# Patient Record
Sex: Male | Born: 1960 | Race: White | Hispanic: No | Marital: Single | Smoking: Current every day smoker
Health system: Southern US, Community
[De-identification: ages and names within clinical notes are randomized; demographics above are authoritative.]

## PROBLEM LIST (undated history)

## (undated) DIAGNOSIS — I771 Stricture of artery: Secondary | ICD-10-CM

## (undated) DIAGNOSIS — Z720 Tobacco use: Secondary | ICD-10-CM

## (undated) DIAGNOSIS — F1010 Alcohol abuse, uncomplicated: Secondary | ICD-10-CM

## (undated) DIAGNOSIS — E785 Hyperlipidemia, unspecified: Secondary | ICD-10-CM

## (undated) DIAGNOSIS — E119 Type 2 diabetes mellitus without complications: Secondary | ICD-10-CM

## (undated) DIAGNOSIS — I10 Essential (primary) hypertension: Secondary | ICD-10-CM

## (undated) DIAGNOSIS — I739 Peripheral vascular disease, unspecified: Secondary | ICD-10-CM

## (undated) DIAGNOSIS — I252 Old myocardial infarction: Secondary | ICD-10-CM

## (undated) DIAGNOSIS — I2510 Atherosclerotic heart disease of native coronary artery without angina pectoris: Secondary | ICD-10-CM

## (undated) HISTORY — PX: TONSILECTOMY, ADENOIDECTOMY, BILATERAL MYRINGOTOMY AND TUBES: SHX2538

## (undated) HISTORY — DX: Essential (primary) hypertension: I10

## (undated) HISTORY — DX: Type 2 diabetes mellitus without complications: E11.9

## (undated) HISTORY — DX: Hyperlipidemia, unspecified: E78.5

## (undated) HISTORY — DX: Old myocardial infarction: I25.2

## (undated) HISTORY — DX: Peripheral vascular disease, unspecified: I73.9

## (undated) HISTORY — DX: Atherosclerotic heart disease of native coronary artery without angina pectoris: I25.10

## (undated) HISTORY — DX: Stricture of artery: I77.1

---

## 2004-01-20 ENCOUNTER — Emergency Department (HOSPITAL_COMMUNITY): Admission: EM | Admit: 2004-01-20 | Discharge: 2004-01-20 | Payer: Self-pay | Admitting: *Deleted

## 2013-04-25 ENCOUNTER — Emergency Department (HOSPITAL_COMMUNITY)
Admission: EM | Admit: 2013-04-25 | Discharge: 2013-04-26 | Disposition: A | Discharge status: Home / Self Care | Payer: Self-pay | Attending: Emergency Medicine | Admitting: Emergency Medicine

## 2013-04-25 DIAGNOSIS — F10929 Alcohol use, unspecified with intoxication, unspecified: Secondary | ICD-10-CM

## 2013-04-25 DIAGNOSIS — S0003XA Contusion of scalp, initial encounter: Secondary | ICD-10-CM | POA: Insufficient documentation

## 2013-04-25 DIAGNOSIS — F1010 Alcohol abuse, uncomplicated: Secondary | ICD-10-CM | POA: Insufficient documentation

## 2013-04-25 DIAGNOSIS — Y9389 Activity, other specified: Secondary | ICD-10-CM | POA: Insufficient documentation

## 2013-04-25 DIAGNOSIS — IMO0002 Reserved for concepts with insufficient information to code with codable children: Secondary | ICD-10-CM | POA: Insufficient documentation

## 2013-04-25 DIAGNOSIS — Y92410 Unspecified street and highway as the place of occurrence of the external cause: Secondary | ICD-10-CM | POA: Insufficient documentation

## 2013-04-25 MED ORDER — SODIUM CHLORIDE 0.9 % IV BOLUS (SEPSIS)
1000.0000 mL | Freq: Once | INTRAVENOUS | Status: AC
  Administered 2013-04-26: 1000 mL via INTRAVENOUS
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
## 2013-04-26 ENCOUNTER — Emergency Department (HOSPITAL_COMMUNITY): Payer: Self-pay

## 2013-04-26 LAB — URINALYSIS, ROUTINE W REFLEX MICROSCOPIC
Bilirubin Urine: NEGATIVE
Glucose, UA: NEGATIVE mg/dL
Ketones, ur: NEGATIVE mg/dL
Protein, ur: NEGATIVE mg/dL
Urobilinogen, UA: 0.2 mg/dL (ref 0.0–1.0)
pH: 6 (ref 5.0–8.0)

## 2013-04-26 LAB — CBC WITH DIFFERENTIAL/PLATELET
Basophils Absolute: 0.1 10*3/uL (ref 0.0–0.1)
Basophils Relative: 1 % (ref 0–1)
Eosinophils Absolute: 0.1 10*3/uL (ref 0.0–0.7)
Hemoglobin: 14.1 g/dL (ref 13.0–17.0)
MCH: 34.5 pg — ABNORMAL HIGH (ref 26.0–34.0)
MCV: 96.6 fL (ref 78.0–100.0)
Neutrophils Relative %: 55 % (ref 43–77)
Platelets: 151 10*3/uL (ref 150–400)
RBC: 4.09 MIL/uL — ABNORMAL LOW (ref 4.22–5.81)
RDW: 13.3 % (ref 11.5–15.5)

## 2013-04-26 LAB — COMPREHENSIVE METABOLIC PANEL
ALT: 21 U/L (ref 0–53)
Albumin: 3.7 g/dL (ref 3.5–5.2)
Alkaline Phosphatase: 67 U/L (ref 39–117)
CO2: 23 mEq/L (ref 19–32)
Calcium: 8.5 mg/dL (ref 8.4–10.5)
GFR calc non Af Amer: >90 mL/min (ref >90)
Glucose, Bld: 104 mg/dL — ABNORMAL HIGH (ref 70–99)
Sodium: 137 mEq/L (ref 135–145)
Total Bilirubin: 0.3 mg/dL (ref 0.3–1.2)

## 2013-04-26 LAB — LIPASE, BLOOD: Lipase: 30 U/L (ref 11–59)

## 2013-04-26 LAB — ETHANOL: Alcohol, Ethyl (B): 359 mg/dL — ABNORMAL HIGH (ref 0–11)

## 2013-04-26 MED ORDER — ZIPRASIDONE MESYLATE 20 MG IM SOLR
10.0000 mg | Freq: Once | INTRAMUSCULAR | Status: DC
  Filled 2013-04-26: qty 20

## 2013-04-26 MED ORDER — LORAZEPAM 2 MG/ML IJ SOLN
1.0000 mg | Freq: Once | INTRAMUSCULAR | Status: AC
  Administered 2013-04-26: 1 mg via INTRAVENOUS
  Filled 2013-04-26: qty 1

## 2013-04-26 MED ORDER — IOHEXOL 300 MG/ML  SOLN
100.0000 mL | Freq: Once | INTRAMUSCULAR | Status: AC | PRN
  Administered 2013-04-26: 100 mL via INTRAVENOUS
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
## 2013-04-26 NOTE — ED Notes (Signed)
Pt standing up at foot of bed, naked, urinating on the floor. Pt instructed back to bed. Now resting comfortably.

## 2013-04-26 NOTE — ED Notes (Signed)
Patient resting on stretcher states he is feeling ok at present. Alert speech is still a little slurred.
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
## 2013-04-26 NOTE — ED Notes (Signed)
Md was completing discharge papers however patient walked out of room and wouldn't wait for papers, states he is going to work, offered breakfast.

## 2013-09-23 ENCOUNTER — Emergency Department (HOSPITAL_COMMUNITY): Payer: Self-pay

## 2013-09-23 ENCOUNTER — Encounter (HOSPITAL_COMMUNITY): Payer: Self-pay | Admitting: Emergency Medicine

## 2013-09-23 ENCOUNTER — Emergency Department (HOSPITAL_COMMUNITY)
Admission: EM | Admit: 2013-09-23 | Discharge: 2013-09-24 | Disposition: A | Discharge status: Home / Self Care | Payer: Self-pay | Attending: Emergency Medicine | Admitting: Emergency Medicine

## 2013-09-23 DIAGNOSIS — S0181XA Laceration without foreign body of other part of head, initial encounter: Secondary | ICD-10-CM

## 2013-09-23 DIAGNOSIS — X58XXXA Exposure to other specified factors, initial encounter: Secondary | ICD-10-CM | POA: Insufficient documentation

## 2013-09-23 DIAGNOSIS — Y939 Activity, unspecified: Secondary | ICD-10-CM | POA: Insufficient documentation

## 2013-09-23 DIAGNOSIS — Y929 Unspecified place or not applicable: Secondary | ICD-10-CM | POA: Insufficient documentation

## 2013-09-23 DIAGNOSIS — Z23 Encounter for immunization: Secondary | ICD-10-CM | POA: Insufficient documentation

## 2013-09-23 DIAGNOSIS — F1010 Alcohol abuse, uncomplicated: Secondary | ICD-10-CM | POA: Insufficient documentation

## 2013-09-23 DIAGNOSIS — S022XXB Fracture of nasal bones, initial encounter for open fracture: Secondary | ICD-10-CM | POA: Insufficient documentation

## 2013-09-23 DIAGNOSIS — S0180XA Unspecified open wound of other part of head, initial encounter: Secondary | ICD-10-CM | POA: Insufficient documentation

## 2013-09-23 HISTORY — DX: Alcohol abuse, uncomplicated: F10.10

## 2013-09-23 LAB — COMPREHENSIVE METABOLIC PANEL
ALBUMIN: 4.1 g/dL (ref 3.5–5.2)
ALT: 21 U/L (ref 0–53)
AST: 27 U/L (ref 0–37)
Alkaline Phosphatase: 65 U/L (ref 39–117)
BUN: 14 mg/dL (ref 6–23)
CALCIUM: 9.1 mg/dL (ref 8.4–10.5)
CO2: 22 mEq/L (ref 19–32)
CREATININE: 0.72 mg/dL (ref 0.50–1.35)
Chloride: 102 mEq/L (ref 96–112)
GFR calc Af Amer: >90 mL/min (ref >90)
GFR calc non Af Amer: >90 mL/min (ref >90)
Glucose, Bld: 117 mg/dL — ABNORMAL HIGH (ref 70–99)
Potassium: 4 mEq/L (ref 3.7–5.3)
Sodium: 140 mEq/L (ref 137–147)
TOTAL PROTEIN: 7.2 g/dL (ref 6.0–8.3)
Total Bilirubin: 0.3 mg/dL (ref 0.3–1.2)

## 2013-09-23 LAB — CBC WITH DIFFERENTIAL/PLATELET
BASOS ABS: 0 10*3/uL (ref 0.0–0.1)
BASOS PCT: 1 % (ref 0–1)
EOS ABS: 0.1 10*3/uL (ref 0.0–0.7)
EOS PCT: 1 % (ref 0–5)
HCT: 41.6 % (ref 39.0–52.0)
HEMOGLOBIN: 14.5 g/dL (ref 13.0–17.0)
LYMPHS ABS: 2.7 10*3/uL (ref 0.7–4.0)
Lymphocytes Relative: 30 % (ref 12–46)
MCH: 33.7 pg (ref 26.0–34.0)
MCHC: 34.9 g/dL (ref 30.0–36.0)
MCV: 96.7 fL (ref 78.0–100.0)
MONO ABS: 1.2 10*3/uL — AB (ref 0.1–1.0)
MONOS PCT: 14 % — AB (ref 3–12)
Neutro Abs: 4.8 10*3/uL (ref 1.7–7.7)
Neutrophils Relative %: 54 % (ref 43–77)
Platelets: 156 10*3/uL (ref 150–400)
RBC: 4.3 MIL/uL (ref 4.22–5.81)
RDW: 13 % (ref 11.5–15.5)
WBC: 8.8 10*3/uL (ref 4.0–10.5)

## 2013-09-23 LAB — PROTIME-INR
INR: 0.96 (ref 0.00–1.49)
Prothrombin Time: 12.6 seconds (ref 11.6–15.2)

## 2013-09-23 LAB — RAPID URINE DRUG SCREEN, HOSP PERFORMED
Amphetamines: NOT DETECTED
BARBITURATES: NOT DETECTED
BENZODIAZEPINES: NOT DETECTED
COCAINE: NOT DETECTED
OPIATES: NOT DETECTED
TETRAHYDROCANNABINOL: POSITIVE — AB

## 2013-09-23 MED ORDER — PROPOFOL 10 MG/ML IV BOLUS
INTRAVENOUS | Status: AC
  Filled 2013-09-23: qty 20

## 2013-09-23 MED ORDER — LORAZEPAM 2 MG/ML IJ SOLN
2.0000 mg | Freq: Once | INTRAMUSCULAR | Status: AC
  Administered 2013-09-23: 2 mg via INTRAVENOUS
  Filled 2013-09-23: qty 1

## 2013-09-23 MED ORDER — ETOMIDATE 2 MG/ML IV SOLN
INTRAVENOUS | Status: AC | PRN
  Administered 2013-09-23: 1 mg via INTRAVENOUS
  Administered 2013-09-23: 6 mg via INTRAVENOUS
  Administered 2013-09-23: 1 mg via INTRAVENOUS
  Administered 2013-09-23: 12 mg via INTRAVENOUS

## 2013-09-23 MED ORDER — ETOMIDATE 2 MG/ML IV SOLN
0.1500 mg/kg | Freq: Once | INTRAVENOUS | Status: AC
  Administered 2013-09-23: 12 mg via INTRAVENOUS
  Filled 2013-09-23: qty 10

## 2013-09-23 MED ORDER — PROPOFOL 10 MG/ML IV BOLUS: 1.0000 mg/kg | Freq: Once | INTRAVENOUS | Status: DC
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
## 2013-09-23 NOTE — ED Provider Notes (Signed)
CSN: [id number]     Arrival date & time 09/23/13  1813 History   First MD Initiated Contact with Patient 09/23/13 1819     Chief Complaint  Patient presents with  . Head Laceration   (Consider location/radiation/quality/duration/timing/severity/associated sxs/prior Treatment) HPI Comments: Patient is a 53 year old male with a past medical history of ETOH abuse who presents with a facial laceration that occurred prior to arrival. Patient presents via EMS. Patient's girlfriend found him at home and he was "pretty drunk" according to the girlfriend. She is at the bedside. Patient is a poor historian. Patient and girlfriend are unsure of what happened that caused the laceration. No other associated symptoms.    Past Medical History  Diagnosis Date  . ETOH abuse    No past surgical history on file. No family history on file. History  Substance Use Topics  . Smoking status: Not on file  . Smokeless tobacco: Not on file  . Alcohol Use: Not on file    Review of Systems  Constitutional: Negative for fever, chills and fatigue.  HENT: Negative for trouble swallowing.   Eyes: Negative for visual disturbance.  Respiratory: Negative for shortness of breath.   Cardiovascular: Negative for chest pain and palpitations.  Gastrointestinal: Negative for nausea, vomiting, abdominal pain and diarrhea.  Genitourinary: Negative for dysuria and difficulty urinating.  Musculoskeletal: Negative for arthralgias and neck pain.  Skin: Positive for wound. Negative for color change.  Neurological: Negative for dizziness and weakness.  Psychiatric/Behavioral: Negative for dysphoric mood.    Allergies  Review of patient's allergies indicates not on file.  Home Medications  No current outpatient prescriptions on file. There were no vitals taken for this visit. Physical Exam  Nursing note and vitals reviewed. Constitutional: He is oriented to person, place, and time. He appears well-developed and  well-nourished. No distress.  HENT:  Head: Normocephalic and atraumatic.  Eyes: Conjunctivae and EOM are normal.  Neck: Normal range of motion. Neck supple.  Cardiovascular: Normal rate and regular rhythm.  Exam reveals no gallop and no friction rub.   No murmur heard. Pulmonary/Chest: Effort normal and breath sounds normal. He has no wheezes. He has no rales. He exhibits no tenderness.  Abdominal: Soft. He exhibits no distension. There is no tenderness. There is no rebound and no guarding.  Musculoskeletal: Normal range of motion.  Neurological: He is alert and oriented to person, place, and time.  Speech is goal-oriented. Moves limbs without ataxia.   Skin: Skin is warm and dry.  4cm x3cm "T" shaped deep laceration of central forehead that extends to the nasal bridge and is actively bleeding.   Psychiatric: He has a normal mood and affect. His behavior is normal.    ED Course  Procedures (including critical care time)  Procedural sedation Performed by: Emilia Beck Consent: Verbal consent obtained. Risks and benefits: risks, benefits and alternatives were discussed Required items: required blood products, implants, devices, and special equipment available Patient identity confirmed: arm band and provided demographic data Time out: Immediately prior to procedure a "time out" was called to verify the correct patient, procedure, equipment, support staff and site/side marked as required.  Sedation type: moderate (conscious) sedation NPO time confirmed and considedered  Sedatives: ETOMIDATE  Physician Time at Bedside: 30 minutes  Vitals: Vital signs were monitored during sedation. Cardiac Monitor, pulse oximeter Patient tolerance: Patient tolerated the procedure well with no immediate complications. Comments: Pt with uneventful recovered. Returned to pre-procedural sedation baseline  LACERATION REPAIR Performed by:  Emilia Beck Authorized by: Emilia Beck Consent: Verbal consent obtained. Risks and benefits: risks, benefits and alternatives were discussed Consent given by: patient Patient identity confirmed: provided demographic data Prepped and Draped in normal sterile fashion Wound explored  Laceration Location: central forehead  Laceration Length: 4cmx3cm T-shaped laceration  No Foreign Bodies seen or palpated  Anesthesia: local infiltration  Local anesthetic: lidocaine 2% with epinephrine  Anesthetic total: 8 ml  Irrigation method: syringe Amount of cleaning: standard  Skin closure: 5-0 prolene  Number of sutures: 10  Technique: simple  Patient tolerance: Patient tolerated the procedure well with no immediate complications.    Labs Review Labs Reviewed  CBC WITH DIFFERENTIAL - Abnormal; Notable for the following:    Monocytes Relative 14 (*)    Monocytes Absolute 1.2 (*)    All other components within normal limits  COMPREHENSIVE METABOLIC PANEL - Abnormal; Notable for the following:    Glucose, Bld 117 (*)    All other components within normal limits  URINE RAPID DRUG SCREEN (HOSP PERFORMED) - Abnormal; Notable for the following:    Tetrahydrocannabinol POSITIVE (*)    All other components within normal limits  PROTIME-INR   Imaging Review Ct Head Wo Contrast  09/23/2013   CLINICAL DATA:  ETOH abuse, head laceration  EXAM: CT HEAD WITHOUT CONTRAST  CT MAXILLOFACIAL WITHOUT CONTRAST  CT CERVICAL SPINE WITHOUT CONTRAST  TECHNIQUE: Multidetector CT imaging of the head, cervical spine, and maxillofacial structures were performed using the standard protocol without intravenous contrast. Multiplanar CT image reconstructions of the cervical spine and maxillofacial structures were also generated.  COMPARISON:  CT head dated 09/23/2013 at 1920 hr. CT head cervical spine dated 04/26/2013.  FINDINGS: CT HEAD FINDINGS  Mildly motion degraded images.  No evidence of parenchymal hemorrhage or extra-axial fluid  collection. No mass lesion, mass effect, or midline shift.  No CT evidence of acute infarction.  Cerebral volume is within normal limits. No ventriculomegaly.  Bilateral nasal bone fractures. Soft tissue swelling/laceration overlying the left frontal bone and nasal bridge.  Mucosal thickening involving the bilateral ethmoid and maxillary sinuses. Mastoid air cells are clear.  No evidence of calvarial fracture.  CT MAXILLOFACIAL FINDINGS  Moderate to severe motion degraded images.  Bilateral nasal bone fractures. Soft tissue swelling/laceration overlying the left frontal bone and nasal bridge.  Otherwise, no evidence of maxillofacial fracture.  Mild mucosal thickening involving the bilateral ethmoid and maxillary sinuses. Mastoid air cells are clear.  Right ovary subluxation of C1 on C2.  The dens appears intact.  CT CERVICAL SPINE FINDINGS  Severely motion degraded images.  No gross cervical spine fracture is visualized, although the study is essentially nondiagnostic. Rotary subluxation of C1 on C2.  Visualized lung apices are notable for paraseptal emphysematous changes.  IMPRESSION: Moderate to severe motion degraded images, particularly involving the maxillofacial and cervical spine CTs.  No evidence of acute intracranial abnormality.  Bilateral nasal bone fractures. Soft tissue swelling/laceration overlying the left frontal bone and nasal bridge.  Rotary subluxation of C1 on C2. Dens appears intact on maxillofacial CT.  No gross cervical spine fracture is visualized, although the cervical spine study is essentially nondiagnostic.   Electronically Signed   By: Charline Bills M.D.   On: 09/23/2013 23:04   Ct Head Wo Contrast  09/23/2013   CLINICAL DATA:  Altered mental status, alcohol intoxication, and forehead laceration.  EXAM: CT HEAD WITHOUT CONTRAST  TECHNIQUE: Contiguous axial images were obtained from the base of the skull through  the vertex without intravenous contrast.  COMPARISON:  Noncontrast CT  scan of the brain dated April 26, 2013  FINDINGS: Motion artifact degrades the examination. The ventricles are normal in size and position. There is no intracranial hemorrhage nor intracranial mass effect. There are no findings to suggest an evolving ischemic infarction. The cerebellum and brainstem are grossly normal. At bone window settings the observed portions of the paranasal sinuses and mastoid air cells are clear. No acute skull fracture is demonstrated.  IMPRESSION: 1. The study is quite limited due to motion artifact. There is no definite evidence of an acute intracranial abnormality nor of an acute skull fracture. 2. No intracranial mass effect is demonstrated. 3. Repeating the scan when the patient can tolerate the procedure and remain motionless would be useful.   Electronically Signed   By: David  [place]   On: 09/23/2013 19:48   Ct Cervical Spine Wo Contrast  09/23/2013   CLINICAL DATA:  ETOH abuse, head laceration  EXAM: CT HEAD WITHOUT CONTRAST  CT MAXILLOFACIAL WITHOUT CONTRAST  CT CERVICAL SPINE WITHOUT CONTRAST  TECHNIQUE: Multidetector CT imaging of the head, cervical spine, and maxillofacial structures were performed using the standard protocol without intravenous contrast. Multiplanar CT image reconstructions of the cervical spine and maxillofacial structures were also generated.  COMPARISON:  CT head dated 09/23/2013 at 1920 hr. CT head cervical spine dated 04/26/2013.  FINDINGS: CT HEAD FINDINGS  Mildly motion degraded images.  No evidence of parenchymal hemorrhage or extra-axial fluid collection. No mass lesion, mass effect, or midline shift.  No CT evidence of acute infarction.  Cerebral volume is within normal limits. No ventriculomegaly.  Bilateral nasal bone fractures. Soft tissue swelling/laceration overlying the left frontal bone and nasal bridge.  Mucosal thickening involving the bilateral ethmoid and maxillary sinuses. Mastoid air cells are clear.  No evidence of calvarial  fracture.  CT MAXILLOFACIAL FINDINGS  Moderate to severe motion degraded images.  Bilateral nasal bone fractures. Soft tissue swelling/laceration overlying the left frontal bone and nasal bridge.  Otherwise, no evidence of maxillofacial fracture.  Mild mucosal thickening involving the bilateral ethmoid and maxillary sinuses. Mastoid air cells are clear.  Right ovary subluxation of C1 on C2.  The dens appears intact.  CT CERVICAL SPINE FINDINGS  Severely motion degraded images.  No gross cervical spine fracture is visualized, although the study is essentially nondiagnostic. Rotary subluxation of C1 on C2.  Visualized lung apices are notable for paraseptal emphysematous changes.  IMPRESSION: Moderate to severe motion degraded images, particularly involving the maxillofacial and cervical spine CTs.  No evidence of acute intracranial abnormality.  Bilateral nasal bone fractures. Soft tissue swelling/laceration overlying the left frontal bone and nasal bridge.  Rotary subluxation of C1 on C2. Dens appears intact on maxillofacial CT.  No gross cervical spine fracture is visualized, although the cervical spine study is essentially nondiagnostic.   Electronically Signed   By: Charline BillsSriyesh  Krishnan M.D.   On: 09/23/2013 23:04   Ct Maxillofacial Wo Cm  09/23/2013   CLINICAL DATA:  ETOH abuse, head laceration  EXAM: CT HEAD WITHOUT CONTRAST  CT MAXILLOFACIAL WITHOUT CONTRAST  CT CERVICAL SPINE WITHOUT CONTRAST  TECHNIQUE: Multidetector CT imaging of the head, cervical spine, and maxillofacial structures were performed using the standard protocol without intravenous contrast. Multiplanar CT image reconstructions of the cervical spine and maxillofacial structures were also generated.  COMPARISON:  CT head dated 09/23/2013 at 1920 hr. CT head cervical spine dated 04/26/2013.  FINDINGS: CT HEAD FINDINGS  Mildly motion  degraded images.  No evidence of parenchymal hemorrhage or extra-axial fluid collection. No mass lesion, mass  effect, or midline shift.  No CT evidence of acute infarction.  Cerebral volume is within normal limits. No ventriculomegaly.  Bilateral nasal bone fractures. Soft tissue swelling/laceration overlying the left frontal bone and nasal bridge.  Mucosal thickening involving the bilateral ethmoid and maxillary sinuses. Mastoid air cells are clear.  No evidence of calvarial fracture.  CT MAXILLOFACIAL FINDINGS  Moderate to severe motion degraded images.  Bilateral nasal bone fractures. Soft tissue swelling/laceration overlying the left frontal bone and nasal bridge.  Otherwise, no evidence of maxillofacial fracture.  Mild mucosal thickening involving the bilateral ethmoid and maxillary sinuses. Mastoid air cells are clear.  Right ovary subluxation of C1 on C2.  The dens appears intact.  CT CERVICAL SPINE FINDINGS  Severely motion degraded images.  No gross cervical spine fracture is visualized, although the study is essentially nondiagnostic. Rotary subluxation of C1 on C2.  Visualized lung apices are notable for paraseptal emphysematous changes.  IMPRESSION: Moderate to severe motion degraded images, particularly involving the maxillofacial and cervical spine CTs.  No evidence of acute intracranial abnormality.  Bilateral nasal bone fractures. Soft tissue swelling/laceration overlying the left frontal bone and nasal bridge.  Rotary subluxation of C1 on C2. Dens appears intact on maxillofacial CT.  No gross cervical spine fracture is visualized, although the cervical spine study is essentially nondiagnostic.   Electronically Signed   By: Charline Bills M.D.   On: 09/23/2013 23:04    EKG Interpretation   None       MDM   1. Laceration of face   2. Fracture, nasal bone, open     6:32 PM CT head, cervical spine and face pending. Patient has a large laceration on central forehead. Vitals stable and patient afebrile.   12:35 AM Patient's imaging interpretation limited by motion artifact due to patient  being uncooperative despite IV ativan. Patient has bilateral nasal bone fractures without other acute abnormality. Patient will have follow up with ENT. Patient will go home on antibiotics and have tdap here. No further evaluation needed. Patient laceration repaired.    Emilia Beck, PA-C 09/24/13 [phone]

## 2013-09-23 NOTE — ED Notes (Signed)
Pt brought from home via [facility] with c/o head laceration. Pt was found by his girlfriend at home to have a large laceration to forehead just between eyes. Pt has ETOH on board and is combative. Security at bedside.

## 2013-09-23 NOTE — ED Notes (Signed)
Dr. Rosalia Hammersay at bedside for assessment

## 2013-09-24 MED ORDER — CEPHALEXIN 500 MG PO CAPS: 500.0000 mg | ORAL_CAPSULE | Freq: Four times a day (QID) | ORAL | Status: DC

## 2013-09-24 MED ORDER — TETANUS-DIPHTH-ACELL PERTUSSIS 5-2.5-18.5 LF-MCG/0.5 IM SUSP
0.5000 mL | Freq: Once | INTRAMUSCULAR | Status: AC
  Administered 2013-09-24: 0.5 mL via INTRAMUSCULAR
  Filled 2013-09-24: qty 0.5

## 2013-09-24 NOTE — ED Provider Notes (Signed)
53 year old male who presents today after a head injury. He is extremely intoxicated and unable to give any history. He has a laceration to the forehead extending down to his nose continues to profusely bleed. I was present for conscious sedation and suture repair done by Emilia BeckKaitlyn Szekalski PAC.   I performed a history and physical examination of Karl Garcia and discussed his management with Ms. Murray HodgkinsSzekalski.  I agree with the history, physical, assessment, and plan of care, with the following exceptions: None  I was present for the following procedures: None Time Spent in Critical Care of the patient: None Time spent in discussions with the patient and family: [id number]  Sherrita Riederer S    Karl Quarryanielle S Kristopher Attwood, MD 09/24/13 1600

## 2013-09-24 NOTE — Discharge Instructions (Signed)
Take Keflex as directed until gone. Follow up with Dr. Jearld FentonByers for further evaluation and management of your nasal fracture. Your sutures will need to be removed in 1 week. Refer to attached documents for more information. Take tylenol as needed for pain.

## 2020-02-16 ENCOUNTER — Emergency Department (HOSPITAL_COMMUNITY): Payer: Self-pay

## 2020-02-16 ENCOUNTER — Encounter (HOSPITAL_COMMUNITY)
Admission: RE | Disposition: A | Discharge status: Home / Self Care | Payer: Self-pay | Source: Home / Self Care | Attending: Cardiothoracic Surgery

## 2020-02-16 ENCOUNTER — Encounter (HOSPITAL_COMMUNITY): Payer: Self-pay | Admitting: Cardiology

## 2020-02-16 ENCOUNTER — Other Ambulatory Visit: Payer: Self-pay | Admitting: Cardiothoracic Surgery

## 2020-02-16 ENCOUNTER — Inpatient Hospital Stay (HOSPITAL_COMMUNITY): Payer: Self-pay

## 2020-02-16 ENCOUNTER — Other Ambulatory Visit: Payer: Self-pay

## 2020-02-16 ENCOUNTER — Inpatient Hospital Stay (HOSPITAL_COMMUNITY)
Admission: RE | Admit: 2020-02-16 | Discharge: 2020-02-21 | DRG: 234 | Disposition: A | Discharge status: Home / Self Care | Payer: Self-pay | Attending: Cardiothoracic Surgery | Admitting: Cardiothoracic Surgery

## 2020-02-16 DIAGNOSIS — R079 Chest pain, unspecified: Secondary | ICD-10-CM | POA: Diagnosis present

## 2020-02-16 DIAGNOSIS — E1151 Type 2 diabetes mellitus with diabetic peripheral angiopathy without gangrene: Secondary | ICD-10-CM | POA: Diagnosis present

## 2020-02-16 DIAGNOSIS — D62 Acute posthemorrhagic anemia: Secondary | ICD-10-CM | POA: Diagnosis not present

## 2020-02-16 DIAGNOSIS — I708 Atherosclerosis of other arteries: Secondary | ICD-10-CM | POA: Diagnosis present

## 2020-02-16 DIAGNOSIS — J9811 Atelectasis: Secondary | ICD-10-CM

## 2020-02-16 DIAGNOSIS — I2510 Atherosclerotic heart disease of native coronary artery without angina pectoris: Secondary | ICD-10-CM

## 2020-02-16 DIAGNOSIS — I10 Essential (primary) hypertension: Secondary | ICD-10-CM | POA: Diagnosis present

## 2020-02-16 DIAGNOSIS — Z951 Presence of aortocoronary bypass graft: Secondary | ICD-10-CM

## 2020-02-16 DIAGNOSIS — F1020 Alcohol dependence, uncomplicated: Secondary | ICD-10-CM | POA: Diagnosis present

## 2020-02-16 DIAGNOSIS — E8770 Fluid overload, unspecified: Secondary | ICD-10-CM | POA: Diagnosis not present

## 2020-02-16 DIAGNOSIS — D7589 Other specified diseases of blood and blood-forming organs: Secondary | ICD-10-CM | POA: Diagnosis not present

## 2020-02-16 DIAGNOSIS — Z09 Encounter for follow-up examination after completed treatment for conditions other than malignant neoplasm: Secondary | ICD-10-CM

## 2020-02-16 DIAGNOSIS — I214 Non-ST elevation (NSTEMI) myocardial infarction: Secondary | ICD-10-CM

## 2020-02-16 DIAGNOSIS — Z794 Long term (current) use of insulin: Secondary | ICD-10-CM

## 2020-02-16 DIAGNOSIS — D696 Thrombocytopenia, unspecified: Secondary | ICD-10-CM | POA: Diagnosis not present

## 2020-02-16 DIAGNOSIS — F17210 Nicotine dependence, cigarettes, uncomplicated: Secondary | ICD-10-CM | POA: Diagnosis present

## 2020-02-16 DIAGNOSIS — Z01810 Encounter for preprocedural cardiovascular examination: Secondary | ICD-10-CM

## 2020-02-16 DIAGNOSIS — Z20822 Contact with and (suspected) exposure to covid-19: Secondary | ICD-10-CM | POA: Diagnosis present

## 2020-02-16 DIAGNOSIS — J449 Chronic obstructive pulmonary disease, unspecified: Secondary | ICD-10-CM | POA: Diagnosis present

## 2020-02-16 DIAGNOSIS — Z79899 Other long term (current) drug therapy: Secondary | ICD-10-CM

## 2020-02-16 DIAGNOSIS — I25110 Atherosclerotic heart disease of native coronary artery with unstable angina pectoris: Secondary | ICD-10-CM

## 2020-02-16 DIAGNOSIS — I200 Unstable angina: Secondary | ICD-10-CM

## 2020-02-16 HISTORY — DX: Tobacco use: Z72.0

## 2020-02-16 HISTORY — DX: Non-ST elevation (NSTEMI) myocardial infarction: I21.4

## 2020-02-16 HISTORY — PX: LEFT HEART CATH AND CORONARY ANGIOGRAPHY: CATH118249

## 2020-02-16 LAB — MAGNESIUM: Magnesium: 1.9 mg/dL (ref 1.7–2.4)

## 2020-02-16 LAB — BASIC METABOLIC PANEL
Anion gap: 15 (ref 5–15)
BUN: 13 mg/dL (ref 8–23)
CO2: 21 mmol/L — ABNORMAL LOW (ref 22–32)
Calcium: 8.6 mg/dL — ABNORMAL LOW (ref 8.9–10.3)
Chloride: 104 mmol/L (ref 98–111)
Creatinine, Ser: 0.62 mg/dL (ref 0.61–1.24)
GFR calc Af Amer: >60 mL/min (ref >60)
GFR calc non Af Amer: >60 mL/min (ref >60)
Glucose, Bld: 136 mg/dL — ABNORMAL HIGH (ref 70–99)
Potassium: 3.4 mmol/L — ABNORMAL LOW (ref 3.5–5.1)
Sodium: 140 mmol/L (ref 135–145)

## 2020-02-16 LAB — T4, FREE: Free T4: 0.78 ng/dL (ref 0.61–1.12)

## 2020-02-16 LAB — CBC
HCT: 44.9 % (ref 39.0–52.0)
Hemoglobin: 15 g/dL (ref 13.0–17.0)
MCH: 33.6 pg (ref 26.0–34.0)
MCHC: 33.4 g/dL (ref 30.0–36.0)
MCV: 100.7 fL — ABNORMAL HIGH (ref 80.0–100.0)
Platelets: 157 10*3/uL (ref 150–400)
RBC: 4.46 MIL/uL (ref 4.22–5.81)
RDW: 13.1 % (ref 11.5–15.5)
WBC: 5.3 10*3/uL (ref 4.0–10.5)
nRBC: 0 % (ref 0.0–0.2)

## 2020-02-16 LAB — PREPARE RBC (CROSSMATCH)

## 2020-02-16 LAB — ECHOCARDIOGRAM COMPLETE
Height: 71 in
Weight: 2960 oz

## 2020-02-16 LAB — TROPONIN I (HIGH SENSITIVITY)
Troponin I (High Sensitivity): 89 ng/L — ABNORMAL HIGH (ref <18)
Troponin I (High Sensitivity): 123 ng/L (ref <18)

## 2020-02-16 LAB — HEPATIC FUNCTION PANEL
ALT: 29 U/L (ref 0–44)
AST: 33 U/L (ref 15–41)
Albumin: 3.8 g/dL (ref 3.5–5.0)
Alkaline Phosphatase: 54 U/L (ref 38–126)
Bilirubin, Direct: 0.2 mg/dL (ref 0.0–0.2)
Indirect Bilirubin: 0.6 mg/dL (ref 0.3–0.9)
Total Bilirubin: 0.8 mg/dL (ref 0.3–1.2)
Total Protein: 6.8 g/dL (ref 6.5–8.1)

## 2020-02-16 LAB — HEMOGLOBIN A1C
Hgb A1c MFr Bld: 6.8 % — ABNORMAL HIGH (ref 4.8–5.6)
Mean Plasma Glucose: 148.46 mg/dL

## 2020-02-16 LAB — TSH: TSH: 0.656 u[IU]/mL (ref 0.350–4.500)

## 2020-02-16 LAB — ABO/RH: ABO/RH(D): O POS

## 2020-02-16 LAB — PHOSPHORUS: Phosphorus: 3.6 mg/dL (ref 2.5–4.6)

## 2020-02-16 LAB — LIPASE, BLOOD: Lipase: 22 U/L (ref 11–51)

## 2020-02-16 LAB — SARS CORONAVIRUS 2 BY RT PCR (HOSPITAL ORDER, PERFORMED IN ~~LOC~~ HOSPITAL LAB): SARS Coronavirus 2: NEGATIVE

## 2020-02-16 SURGERY — LEFT HEART CATH AND CORONARY ANGIOGRAPHY
Anesthesia: LOCAL

## 2020-02-16 MED ORDER — DIAZEPAM 5 MG PO TABS
5.0000 mg | ORAL_TABLET | Freq: Once | ORAL | Status: AC
  Administered 2020-02-17: 5 mg via ORAL
  Filled 2020-02-16: qty 1

## 2020-02-16 MED ORDER — MILRINONE LACTATE IN DEXTROSE 20-5 MG/100ML-% IV SOLN
0.3000 ug/kg/min | INTRAVENOUS | Status: DC
  Filled 2020-02-16: qty 100

## 2020-02-16 MED ORDER — METOPROLOL TARTRATE 5 MG/5ML IV SOLN
2.5000 mg | Freq: Once | INTRAVENOUS | Status: AC
  Administered 2020-02-16: 2.5 mg via INTRAVENOUS
  Filled 2020-02-16: qty 5

## 2020-02-16 MED ORDER — SODIUM CHLORIDE 0.9 % IV SOLN: INTRAVENOUS | Status: DC

## 2020-02-16 MED ORDER — SODIUM CHLORIDE 0.9 % IV SOLN: 250.0000 mL | INTRAVENOUS | Status: DC | PRN

## 2020-02-16 MED ORDER — LORAZEPAM 1 MG PO TABS
0.0000 mg | ORAL_TABLET | Freq: Four times a day (QID) | ORAL | Status: AC
  Administered 2020-02-18: 1 mg via ORAL
  Filled 2020-02-16: qty 1

## 2020-02-16 MED ORDER — EPINEPHRINE HCL 5 MG/250ML IV SOLN IN NS
0.0000 ug/min | INTRAVENOUS | Status: DC
  Filled 2020-02-16: qty 250

## 2020-02-16 MED ORDER — POTASSIUM CHLORIDE 2 MEQ/ML IV SOLN
80.0000 meq | INTRAVENOUS | Status: DC
  Filled 2020-02-16: qty 40

## 2020-02-16 MED ORDER — TRANEXAMIC ACID (OHS) PUMP PRIME SOLUTION
2.0000 mg/kg | INTRAVENOUS | Status: DC
  Filled 2020-02-16: qty 1.68

## 2020-02-16 MED ORDER — LORAZEPAM 2 MG/ML IJ SOLN
1.0000 mg | Freq: Once | INTRAMUSCULAR | Status: AC
  Administered 2020-02-16: 1 mg via INTRAVENOUS

## 2020-02-16 MED ORDER — ASPIRIN 81 MG PO CHEW
324.0000 mg | CHEWABLE_TABLET | Freq: Once | ORAL | Status: AC
  Administered 2020-02-16: 324 mg via ORAL
  Filled 2020-02-16: qty 4

## 2020-02-16 MED ORDER — LORAZEPAM 1 MG PO TABS
0.0000 mg | ORAL_TABLET | Freq: Two times a day (BID) | ORAL | Status: AC
  Filled 2020-02-16: qty 1

## 2020-02-16 MED ORDER — CHLORHEXIDINE GLUCONATE 4 % EX LIQD
60.0000 mL | Freq: Once | CUTANEOUS | Status: AC
  Administered 2020-02-16: 4 via TOPICAL
  Filled 2020-02-16: qty 60

## 2020-02-16 MED ORDER — IOHEXOL 350 MG/ML SOLN
INTRAVENOUS | Status: DC | PRN
  Administered 2020-02-16: 85 mL via INTRA_ARTERIAL

## 2020-02-16 MED ORDER — MOMETASONE FURO-FORMOTEROL FUM 200-5 MCG/ACT IN AERO
2.0000 | INHALATION_SPRAY | Freq: Two times a day (BID) | RESPIRATORY_TRACT | Status: DC
  Administered 2020-02-17: 2 via RESPIRATORY_TRACT
  Filled 2020-02-16: qty 8.8

## 2020-02-16 MED ORDER — NICOTINE 21 MG/24HR TD PT24
21.0000 mg | MEDICATED_PATCH | Freq: Once | TRANSDERMAL | Status: DC
  Filled 2020-02-16: qty 1

## 2020-02-16 MED ORDER — SODIUM CHLORIDE 0.9 % IV SOLN: INTRAVENOUS | Status: AC

## 2020-02-16 MED ORDER — NITROGLYCERIN 0.4 MG SL SUBL
0.4000 mg | SUBLINGUAL_TABLET | SUBLINGUAL | Status: DC | PRN
  Administered 2020-02-16 (×3): 0.4 mg via SUBLINGUAL
  Filled 2020-02-16: qty 1

## 2020-02-16 MED ORDER — FENTANYL CITRATE (PF) 100 MCG/2ML IJ SOLN
INTRAMUSCULAR | Status: AC
  Filled 2020-02-16: qty 2

## 2020-02-16 MED ORDER — ADULT MULTIVITAMIN W/MINERALS CH
1.0000 | ORAL_TABLET | Freq: Every day | ORAL | Status: DC
  Administered 2020-02-16: 1 via ORAL
  Filled 2020-02-16: qty 1

## 2020-02-16 MED ORDER — TRANEXAMIC ACID (OHS) BOLUS VIA INFUSION
15.0000 mg/kg | INTRAVENOUS | Status: AC
  Administered 2020-02-17: 1258.5 mg via INTRAVENOUS
  Filled 2020-02-16: qty 1259

## 2020-02-16 MED ORDER — ASPIRIN 81 MG PO CHEW: 81.0000 mg | CHEWABLE_TABLET | Freq: Every day | ORAL | Status: DC

## 2020-02-16 MED ORDER — LIDOCAINE HCL (PF) 1 % IJ SOLN
INTRAMUSCULAR | Status: AC
  Filled 2020-02-16: qty 30

## 2020-02-16 MED ORDER — CHLORHEXIDINE GLUCONATE 0.12 % MT SOLN
15.0000 mL | Freq: Once | OROMUCOSAL | Status: AC
  Administered 2020-02-17: 15 mL via OROMUCOSAL
  Filled 2020-02-16: qty 15

## 2020-02-16 MED ORDER — SODIUM CHLORIDE 0.9 % IV SOLN
750.0000 mg | INTRAVENOUS | Status: AC
  Administered 2020-02-17: 750 mg via INTRAVENOUS
  Filled 2020-02-16: qty 750

## 2020-02-16 MED ORDER — TRANEXAMIC ACID 1000 MG/10ML IV SOLN
1.5000 mg/kg/h | INTRAVENOUS | Status: AC
  Administered 2020-02-17: 1.5 mg/kg/h via INTRAVENOUS
  Filled 2020-02-16: qty 25

## 2020-02-16 MED ORDER — HEPARIN SODIUM (PORCINE) 1000 UNIT/ML IJ SOLN
INTRAMUSCULAR | Status: AC
  Filled 2020-02-16: qty 1

## 2020-02-16 MED ORDER — NITROGLYCERIN 1 MG/10 ML FOR IR/CATH LAB
INTRA_ARTERIAL | Status: AC
  Filled 2020-02-16: qty 10

## 2020-02-16 MED ORDER — SODIUM CHLORIDE 0.9% FLUSH: 3.0000 mL | Freq: Once | INTRAVENOUS | Status: DC

## 2020-02-16 MED ORDER — ACETAMINOPHEN 325 MG PO TABS
650.0000 mg | ORAL_TABLET | ORAL | Status: DC | PRN
  Administered 2020-02-17: 650 mg via ORAL
  Filled 2020-02-16: qty 2

## 2020-02-16 MED ORDER — SODIUM CHLORIDE 0.9% FLUSH: 3.0000 mL | INTRAVENOUS | Status: DC | PRN

## 2020-02-16 MED ORDER — ONDANSETRON HCL 4 MG/2ML IJ SOLN: 4.0000 mg | Freq: Four times a day (QID) | INTRAMUSCULAR | Status: DC | PRN

## 2020-02-16 MED ORDER — PLASMA-LYTE 148 IV SOLN
INTRAVENOUS | Status: DC
  Filled 2020-02-16: qty 2.5

## 2020-02-16 MED ORDER — HEPARIN (PORCINE) IN NACL 1000-0.9 UT/500ML-% IV SOLN
INTRAVENOUS | Status: AC
  Filled 2020-02-16: qty 500

## 2020-02-16 MED ORDER — TEMAZEPAM 15 MG PO CAPS
15.0000 mg | ORAL_CAPSULE | Freq: Once | ORAL | Status: AC | PRN
  Administered 2020-02-16: 15 mg via ORAL
  Filled 2020-02-16: qty 1

## 2020-02-16 MED ORDER — NITROGLYCERIN IN D5W 200-5 MCG/ML-% IV SOLN
0.0000 ug/min | INTRAVENOUS | Status: DC
  Administered 2020-02-16: 10 ug/min via INTRAVENOUS
  Administered 2020-02-16: 5 ug/min via INTRAVENOUS
  Administered 2020-02-17: 60 ug/min via INTRAVENOUS
  Filled 2020-02-16 (×3): qty 250

## 2020-02-16 MED ORDER — ONDANSETRON HCL 4 MG/2ML IJ SOLN
4.0000 mg | Freq: Four times a day (QID) | INTRAMUSCULAR | Status: DC | PRN
Start: 2020-02-16 — End: 2020-02-16

## 2020-02-16 MED ORDER — HYDRALAZINE HCL 20 MG/ML IJ SOLN: 10.0000 mg | INTRAMUSCULAR | Status: AC | PRN

## 2020-02-16 MED ORDER — ASPIRIN 81 MG PO CHEW: 81.0000 mg | CHEWABLE_TABLET | ORAL | Status: DC

## 2020-02-16 MED ORDER — THIAMINE HCL 100 MG/ML IJ SOLN: 100.0000 mg | Freq: Every day | INTRAMUSCULAR | Status: DC

## 2020-02-16 MED ORDER — THIAMINE HCL 100 MG PO TABS
100.0000 mg | ORAL_TABLET | Freq: Every day | ORAL | Status: DC
  Administered 2020-02-16 – 2020-02-21 (×5): 100 mg via ORAL
  Filled 2020-02-16 (×5): qty 1

## 2020-02-16 MED ORDER — HEPARIN (PORCINE) IN NACL 1000-0.9 UT/500ML-% IV SOLN
INTRAVENOUS | Status: DC | PRN
  Administered 2020-02-16 (×2): 500 mL

## 2020-02-16 MED ORDER — SODIUM CHLORIDE 0.9 % IV SOLN
1.5000 g | INTRAVENOUS | Status: AC
  Administered 2020-02-17: 1.5 g via INTRAVENOUS
  Filled 2020-02-16 (×2): qty 1.5

## 2020-02-16 MED ORDER — FENTANYL CITRATE (PF) 100 MCG/2ML IJ SOLN
INTRAMUSCULAR | Status: DC | PRN
  Administered 2020-02-16: 25 ug via INTRAVENOUS

## 2020-02-16 MED ORDER — MIDAZOLAM HCL 2 MG/2ML IJ SOLN
INTRAMUSCULAR | Status: AC
  Filled 2020-02-16: qty 2

## 2020-02-16 MED ORDER — LORAZEPAM 2 MG/ML IJ SOLN
1.0000 mg | INTRAMUSCULAR | Status: DC | PRN
  Filled 2020-02-16: qty 1

## 2020-02-16 MED ORDER — INSULIN REGULAR(HUMAN) IN NACL 100-0.9 UT/100ML-% IV SOLN
INTRAVENOUS | Status: AC
  Administered 2020-02-17: 1.8 [IU]/h via INTRAVENOUS
  Filled 2020-02-16: qty 100

## 2020-02-16 MED ORDER — PERFLUTREN LIPID MICROSPHERE
1.0000 mL | INTRAVENOUS | Status: AC | PRN
  Administered 2020-02-16: 3 mL via INTRAVENOUS
  Filled 2020-02-16: qty 10

## 2020-02-16 MED ORDER — LIDOCAINE HCL (PF) 1 % IJ SOLN
INTRAMUSCULAR | Status: DC | PRN
  Administered 2020-02-16: 5 mL via INTRADERMAL

## 2020-02-16 MED ORDER — HEPARIN (PORCINE) 25000 UT/250ML-% IV SOLN
1300.0000 [IU]/h | INTRAVENOUS | Status: DC
  Administered 2020-02-16: 1000 [IU]/h via INTRAVENOUS
  Filled 2020-02-16 (×2): qty 250

## 2020-02-16 MED ORDER — SODIUM CHLORIDE 0.9% FLUSH: 3.0000 mL | Freq: Two times a day (BID) | INTRAVENOUS | Status: DC

## 2020-02-16 MED ORDER — NOREPINEPHRINE 4 MG/250ML-% IV SOLN
0.0000 ug/min | INTRAVENOUS | Status: DC
  Filled 2020-02-16: qty 250

## 2020-02-16 MED ORDER — VERAPAMIL HCL 2.5 MG/ML IV SOLN
INTRAVENOUS | Status: AC
  Filled 2020-02-16: qty 2

## 2020-02-16 MED ORDER — MORPHINE SULFATE (PF) 2 MG/ML IV SOLN
2.0000 mg | INTRAVENOUS | Status: DC | PRN
  Filled 2020-02-16: qty 1

## 2020-02-16 MED ORDER — DEXMEDETOMIDINE HCL IN NACL 400 MCG/100ML IV SOLN
0.1000 ug/kg/h | INTRAVENOUS | Status: AC
  Administered 2020-02-17: .7 ug/kg/h via INTRAVENOUS
  Filled 2020-02-16: qty 100

## 2020-02-16 MED ORDER — HEPARIN (PORCINE) 25000 UT/250ML-% IV SOLN: 1000.0000 [IU]/h | INTRAVENOUS | Status: DC

## 2020-02-16 MED ORDER — VERAPAMIL HCL 2.5 MG/ML IV SOLN
INTRA_ARTERIAL | Status: DC | PRN
  Administered 2020-02-16: 15 mL via INTRA_ARTERIAL

## 2020-02-16 MED ORDER — VANCOMYCIN HCL 1250 MG/250ML IV SOLN
1250.0000 mg | INTRAVENOUS | Status: AC
  Administered 2020-02-17: 1250 mg via INTRAVENOUS
  Filled 2020-02-16: qty 250

## 2020-02-16 MED ORDER — NITROGLYCERIN IN D5W 200-5 MCG/ML-% IV SOLN
2.0000 ug/min | INTRAVENOUS | Status: AC
  Administered 2020-02-17: 60 ug/min via INTRAVENOUS
  Filled 2020-02-16: qty 250

## 2020-02-16 MED ORDER — LORAZEPAM 0.5 MG PO TABS: 0.5000 mg | ORAL_TABLET | ORAL | Status: DC | PRN

## 2020-02-16 MED ORDER — HEPARIN SODIUM (PORCINE) 1000 UNIT/ML IJ SOLN
INTRAMUSCULAR | Status: DC | PRN
  Administered 2020-02-16: 4000 [IU] via INTRAVENOUS

## 2020-02-16 MED ORDER — MORPHINE SULFATE (PF) 2 MG/ML IV SOLN
2.0000 mg | Freq: Once | INTRAVENOUS | Status: AC
  Administered 2020-02-16: 2 mg via INTRAVENOUS
  Filled 2020-02-16: qty 1

## 2020-02-16 MED ORDER — METOPROLOL TARTRATE 12.5 MG HALF TABLET
12.5000 mg | ORAL_TABLET | Freq: Once | ORAL | Status: AC
  Administered 2020-02-17: 12.5 mg via ORAL
  Filled 2020-02-16: qty 1

## 2020-02-16 MED ORDER — HEPARIN BOLUS VIA INFUSION
4000.0000 [IU] | Freq: Once | INTRAVENOUS | Status: DC
  Filled 2020-02-16: qty 4000

## 2020-02-16 MED ORDER — ATORVASTATIN CALCIUM 80 MG PO TABS: 80.0000 mg | ORAL_TABLET | Freq: Every day | ORAL | Status: DC

## 2020-02-16 MED ORDER — LORAZEPAM 1 MG PO TABS: 1.0000 mg | ORAL_TABLET | ORAL | Status: DC | PRN

## 2020-02-16 MED ORDER — POTASSIUM CHLORIDE CRYS ER 20 MEQ PO TBCR
40.0000 meq | EXTENDED_RELEASE_TABLET | Freq: Once | ORAL | Status: AC
  Administered 2020-02-16: 40 meq via ORAL
  Filled 2020-02-16: qty 2

## 2020-02-16 MED ORDER — MIDAZOLAM HCL 2 MG/2ML IJ SOLN
INTRAMUSCULAR | Status: DC | PRN
  Administered 2020-02-16: 1 mg via INTRAVENOUS

## 2020-02-16 MED ORDER — ACETAMINOPHEN 325 MG PO TABS: 650.0000 mg | ORAL_TABLET | ORAL | Status: DC | PRN

## 2020-02-16 MED ORDER — BISACODYL 5 MG PO TBEC
5.0000 mg | DELAYED_RELEASE_TABLET | Freq: Once | ORAL | Status: AC
  Administered 2020-02-16: 5 mg via ORAL
  Filled 2020-02-16: qty 1

## 2020-02-16 MED ORDER — CHLORHEXIDINE GLUCONATE 4 % EX LIQD
60.0000 mL | Freq: Once | CUTANEOUS | Status: AC
  Administered 2020-02-17: 4 via TOPICAL
  Filled 2020-02-16: qty 15

## 2020-02-16 MED ORDER — LABETALOL HCL 5 MG/ML IV SOLN: 10.0000 mg | INTRAVENOUS | Status: AC | PRN

## 2020-02-16 MED ORDER — MAGNESIUM SULFATE 50 % IJ SOLN
40.0000 meq | INTRAMUSCULAR | Status: DC
  Filled 2020-02-16: qty 9.85

## 2020-02-16 MED ORDER — PHENYLEPHRINE HCL-NACL 20-0.9 MG/250ML-% IV SOLN
30.0000 ug/min | INTRAVENOUS | Status: AC
  Administered 2020-02-17: 25 ug/min via INTRAVENOUS
  Filled 2020-02-16: qty 250

## 2020-02-16 MED ORDER — SODIUM CHLORIDE 0.9 % IV SOLN
INTRAVENOUS | Status: DC
  Filled 2020-02-16: qty 30

## 2020-02-16 MED ORDER — FOLIC ACID 1 MG PO TABS
1.0000 mg | ORAL_TABLET | Freq: Every day | ORAL | Status: DC
  Administered 2020-02-18 – 2020-02-21 (×4): 1 mg via ORAL
  Filled 2020-02-16 (×4): qty 1

## 2020-02-16 SURGICAL SUPPLY — 12 items
CATH INFINITI 5FR ANG PIGTAIL (CATHETERS) ×2 IMPLANT
CATH OPTITORQUE TIG 4.0 5F (CATHETERS) ×2 IMPLANT
DEVICE RAD COMP TR BAND LRG (VASCULAR PRODUCTS) ×2 IMPLANT
GLIDESHEATH SLEND A-KIT 6F 22G (SHEATH) ×2 IMPLANT
GUIDEWIRE INQWIRE 1.5J.035X260 (WIRE) ×1 IMPLANT
INQWIRE 1.5J .035X260CM (WIRE) ×2
KIT HEART LEFT (KITS) ×2 IMPLANT
PACK CARDIAC CATHETERIZATION (CUSTOM PROCEDURE TRAY) ×2 IMPLANT
SYR MEDRAD MARK 7 150ML (SYRINGE) ×2 IMPLANT
TRANSDUCER W/STOPCOCK (MISCELLANEOUS) ×2 IMPLANT
TUBING CIL FLEX 10 FLL-RA (TUBING) ×2 IMPLANT
WIRE HI TORQ VERSACORE-J 145CM (WIRE) ×2 IMPLANT

## 2020-02-16 NOTE — ED Triage Notes (Signed)
Pt presents to ED POV. Pt c/o CP and L arm numbness. Pt states that it began yesterday with pain in his shoulder. Pt no cardiac hx, neuro intact.

## 2020-02-16 NOTE — Progress Notes (Signed)
Spoke to [employer]. Patient has received all the medications ordered by Dr. Tenny Craw and Nitroglycerin infusion increased to 60 mcg. Patient stated left sided achy chest pain lateral to sternum has decreased from 5/10 to 4/10. Stated it has not gotten less than that since admission to hospital. Sitting up on the side of the bed and eating dinner. After reviewing above Cadence stated that 4/10 pain was alright as long as patient was observed to be comfortable. Will report to Lethea Killings RN.

## 2020-02-16 NOTE — Progress Notes (Signed)
ANTICOAGULATION CONSULT NOTE - Follow Up Consult  Pharmacy Consult for heparin Indication: chest pain/ACS  No Known Allergies  Patient Measurements: Height: 5\' 11"  (180.3 cm) Weight: 83.9 kg (185 lb) IBW/kg (Calculated) : 75.3 Heparin Dosing Weight: 83.9 kg  Vital Signs: Temp: 97.5 F (36.4 C) (06/10 0711) BP: 144/94 (06/10 1501) Pulse Rate: 62 (06/10 1501)  Labs: Recent Labs    02/16/20 0715 02/16/20 0940  HGB 15.0  --   HCT 44.9  --   PLT 157  --   CREATININE 0.62  --   TROPONINIHS 89* 123*    Estimated Creatinine Clearance: 103.3 mL/min (by C-G formula based on SCr of 0.62 mg/dL).   Medical History: Past Medical History:  Diagnosis Date  . ETOH abuse   . ETOH abuse   . Tobacco abuse    Assessment: 59 yr old male presented with CP and arm numbness.  Pharmacy was consulted for heparin dosing. Pt was on no anticoagulation PTA.  CBC WNL.  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 TCTS has been consulted. Pharmacy was consulted to restart heparin infusion (no bolus) 4 hrs after sheath removal (sheath was removed at 14:30 PM, per cath procedure log). Per RN, no issues with bleeding post cath.  Pt was ordered heparin 4000 units IV bolus, followed by heparin infusion at 1000 units/hr, prior to cath, but these were not started prior to cath.  Goal of Therapy:  Heparin level 0.3-0.7 units/ml Monitor platelets by anticoagulation protocol: Yes   Plan:  Heparin infusion at 1000 units/hr, starting 4 hrs after sheath removal Check 6-hr heparin level Monitor daily heparin level, CBC Monitor for signs/symptoms of bleeding F/U TCTS plans  77, PharmD, BCPS, [facility] Clinical Pharmacist 02/16/2020 3:18 PM

## 2020-02-16 NOTE — H&P (Signed)
Date: 02/16/2020               Patient Name:  Karl Garcia MRN: [id number]  DOB: [birth year] Age / Sex: 59 y.o., male   PCP: Patient, No Pcp Per         Medical Service: Internal Medicine Teaching Service         Attending Physician: Dr. Allyson Sabal, Delton See, MD    First Contact: Mcarthur Rossetti, MD, Anthony Roland Pager: SA [phone])  Second Contact: Nedra Hai, MD, Ivin Booty Pager: Eustaquio Maize [phone])       After Hours (After 5p/  First Contact Pager: [phone]  weekends / holidays): Second Contact Pager: [phone]   Chief Complaint: chest pain  History of Present Illness:Mr. Silvestre Mesi 59 y.o.  male w/ PMH significant for alcohol abuse and tobacco use with poor medical follow up presenting with left sided chest pain radiating to left arm since this morning. He notes constant dull pain without associated weakness. He notes approximately 1-2 weeks of mid back and left shoulder pain which he attributes to working. No shortness of breath, headache, vision changes, dizziness/lightheadedness, diaphoresis, nausea/vomiting. No prior similar episodes.   ED course:  Patient presented with left sided chest pain radiating to the arm. He was noted to be hypertensive on arrival and given aspirin 325mg , nitroglycerin x3 with improvement. No significant ST segment changes noted on EKG but did have elevated troponins for which cardiology consulted. Plan for [facility] with medical admission due to alcohol abuse.    Meds:   Current Meds  Medication Sig  . [DISCONTINUED] ibuprofen (ADVIL) 200 MG tablet Take 600 mg by mouth daily.     Allergies: Allergies as of 02/16/2020  . (No Known Allergies)   Past Medical History:  Diagnosis Date  . ETOH abuse   . ETOH abuse   . Tobacco abuse     Family History:  Family History  Problem Relation Age of Onset  . Heart attack Mother   . Heart attack Father   . Heart failure Father   . Healthy Brother    Mother had recent heart attack about 2 years ago at age 29yrs. Father  passed away from MI in late 2s.   Social History:  Social History   Tobacco Use  . Smoking status: Current Every Day Smoker    Packs/day: 1.50  . Smokeless tobacco: Never Used  Vaping Use  . Vaping Use: Never used  Substance Use Topics  . Alcohol use: Yes    Comment: 1 pint of liquor per day   . Drug use: Never   Patient lives at home with wife. 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 He notes drinking 1 pint of vodka daily without prior attempts to cut down. Smokes 1-1.5ppd for ~20 years. No illicit drug use.   Review of Systems: A complete ROS was negative except as per HPI.  Physical Exam: Blood pressure (!) 139/98, pulse 64, temperature (!) 97.5 F (36.4 C), resp. rate 13, height 5\' 11"  (1.803 m), weight 83.9 kg, SpO2 97 %. Physical Exam Constitutional:      General: He is not in acute distress.    Appearance: He is well-developed. He is not ill-appearing or diaphoretic.  HENT:     Head: Normocephalic and atraumatic.  Eyes:     Extraocular Movements: Extraocular movements intact.  Cardiovascular:     Rate and Rhythm: Normal rate and regular rhythm.  No extrasystoles are present.    Chest Wall: PMI is  not displaced.     Heart sounds: Normal heart sounds. Heart sounds not distant. No murmur heard.  No friction rub. No gallop. No S3 sounds.   Pulmonary:     Effort: Pulmonary effort is normal. No accessory muscle usage or respiratory distress.     Breath sounds: Normal breath sounds.  Chest:     Chest wall: No deformity, tenderness or crepitus.  Abdominal:     General: Bowel sounds are normal. There is no abdominal bruit.     Palpations: Abdomen is soft.     Tenderness: There is no abdominal tenderness. There is no guarding or rebound.  Musculoskeletal:        General: Normal range of motion.     Cervical back: Normal range of motion and neck supple.     Right lower leg: No tenderness. No edema.     Left lower leg: No tenderness. No edema.  Skin:    General: Skin is  warm and dry.     Capillary Refill: Capillary refill takes less than 2 seconds.     Coloration: Skin is not cyanotic.     Findings: No erythema.     Nails: There is no clubbing.  Neurological:     General: No focal deficit present.     Mental Status: He is alert and oriented to person, place, and time.     Cranial Nerves: No cranial nerve deficit.     Motor: No weakness.  Psychiatric:        Mood and Affect: Mood normal.        Behavior: Behavior normal.    CBC    Component Value Date/Time   WBC 5.3 02/16/2020 0715   RBC 4.46 02/16/2020 0715   HGB 15.0 02/16/2020 0715   HCT 44.9 02/16/2020 0715   PLT 157 02/16/2020 0715   MCV 100.7 (H) 02/16/2020 0715   MCH 33.6 02/16/2020 0715   MCHC 33.4 02/16/2020 0715   RDW 13.1 02/16/2020 0715   LYMPHSABS 2.7 09/23/2013 1835   MONOABS 1.2 (H) 09/23/2013 1835   EOSABS 0.1 09/23/2013 1835   BASOSABS 0.0 09/23/2013 1835   CMP     Component Value Date/Time   NA 140 02/16/2020 0715   K 3.4 (L) 02/16/2020 0715   CL 104 02/16/2020 0715   CO2 21 (L) 02/16/2020 0715   GLUCOSE 136 (H) 02/16/2020 0715   BUN 13 02/16/2020 0715   CREATININE 0.62 02/16/2020 0715   CALCIUM 8.6 (L) 02/16/2020 0715   PROT 6.8 02/16/2020 1220   ALBUMIN 3.8 02/16/2020 1220   AST 33 02/16/2020 1220   ALT 29 02/16/2020 1220   ALKPHOS 54 02/16/2020 1220   BILITOT 0.8 02/16/2020 1220   GFRNONAA >60 02/16/2020 0715   GFRAA >60 02/16/2020 0715   Lipase     Component Value Date/Time   LIPASE 22 02/16/2020 1220   Magnesium 1.7 - 2.4 mg/dL 1.9    TSH 0.350 - 4.500 uIU/mL 0.656    Free T4 0.61 - 1.12 ng/dL 0.78    Hgb A1c MFr Bld 4.8 - 5.6 % 6.8High    EKG: personally reviewed my interpretation is normal sinus rhythm, no apparent ST segment changes   CXR: IMPRESSION: No active cardiopulmonary disease.  Assessment & Plan by Problem: Active Problems:   Chest pain   NSTEMI (non-ST elevated myocardial infarction) [facility]) Mr Gaspar Fowle is a 59 yr old  male with history of alcohol abuse and tobacco use disorder presenting for evaluation of chest pain  radiating to left arm concerning for NSTEMI.  NSTEMI Chest pain: Patient presented with acute onset left sided dull chest pain radiating to the left arm. No other associated symptoms. Hypertensive on presentation for which given nitroglycerin with some improvement in symptoms. EKG without ST segment changes but noted to have elevated troponins (peak 123). Patient evaluated by cardiology and recommended for cath given prior history of aortic plaque on CT 7 years ago. Patient given heparin bolus and started on infusion. He was also given morphine for pain.  - Cardiology following, appreciate their recommendations - IV heparin  - Aspirin 81mg  daily - Lipitor 80mg  fsily  Alcohol abuse: Patient notes extensive history of alcohol use and notes drinking 1 pint of vodka daily. He has never tried to quit or cut down previously. Last drink was yesterday evening.  - CIWA w/Ativan - Thiamine 100mg  daily - Folic acid 1mg  daily - Multivitamin w/minerals daily   Tobacco use disorder: Patient is current smoker with 1-1.5ppd for 20 years.  - Nicotine 21mg  patch q24h  Diabetes mellitus:  Patient without known history of diabetes. HbA1c 6.8 on admission.  - CBG monitoring - SSI   FEN/GI: Diet: Carb modified Fluids: None Electrolytes: Monitor and replete prn  DVT Prophylaxis: Heparin Code status: FULL   Dispo: Admit patient to Inpatient with expected length of stay greater than 2 midnights.  Signed: , MD  Internal Medicine, PGY1 02/16/2020, 2:40 PM  Pager: [phone]

## 2020-02-16 NOTE — Consult Note (Addendum)
[address]       [place] [address]             [phone]        Karl Garcia [facility] Medical Record #[id number] Date of Birth: 04-11-1961  Referring: Karl Garcia Primary Care: Patient, No Pcp Per Primary Cardiologist:No primary care provider on file.  Chief Complaint:    Chief Complaint  Patient presents with  . Chest Pain  . Arm numbness   History of Present Illness:      [name] is a 59 yo male with no known medical history.  However he does not have any routine medical care and was last seen by a physician in the ED.  He is a current smoker and drinks a pint of alcohol daily.  He presented to the ED today with complaints of chest pain.  He states the pain awoke his from sleep and radiated to his arm.  He describes the pain as an ache and denied N/V, shortness of breath and diaphoresis.  Workup in the ED showed NSR with LT atrial enlargement w/o significant changes.  His Troponin levels were positive.  Patient was given SL NTG and Morphine with resolution of symptoms.  Cardiology evaluation was requested who felt patient should be admitted for further workup.  A CT scan from approximately 7 years ago was reviewed and showed plaque in the aorta.  Due to this patient was taken for cardiac catheterization. This showed a preserved EF of 45-50% and multivessel CAD.  It was felt coronary bypass grafting would be indicated and TCTS consult was requested.  Currently, the patient states he has mild chest discomfort off and on.  He works full time as a Writer.  He admits to nicotine and alcohol use.  He again denied medical problems, but admits to not being evaluated by a physician for a long time.  He is agreeable to proceed with surgery.          Current Activity/ Functional Status: Patient is independent with mobility/ambulation, transfers, ADL's, IADL's.   Zubrod Score: At the time of surgery this patient's most appropriate activity status/level should be  described as:     0    Normal activity, no symptoms     1    Restricted in physical strenuous activity but ambulatory, able to do out light work     2    Ambulatory and capable of self care, unable to do work activities, up and about                 more than 50%  Of the time                                3    Only limited self care, in bed greater than 50% of waking hours     4    Completely disabled, no self care, confined to bed or chair     5    Moribund  Past Medical History:  Diagnosis Date  . ETOH abuse   . ETOH abuse   . Tobacco abuse     Past Surgical History:  Procedure Laterality Date  . TONSILECTOMY, ADENOIDECTOMY, BILATERAL MYRINGOTOMY AND TUBES     unsure of all of that but + tonsilectomy    Social History   Tobacco Use  Smoking Status Current Every Day Smoker  .  Packs/day: 1.50  Smokeless Tobacco Never Used    Social History   Substance and Sexual Activity  Alcohol Use Yes   Comment: 1 pint of liquor per day      No Known Allergies  Current Facility-Administered Medications  Medication Dose Route Frequency Provider Last Rate Last Admin  . 0.9 %  sodium chloride infusion   Intravenous Continuous Runell Gess, MD 75 mL/hr at 02/16/20 1325 New Bag at 02/16/20 1325  . 0.9 %  sodium chloride infusion   Intravenous Continuous Runell Gess, MD 75 mL/hr at 02/16/20 1535 Rate Change at 02/16/20 1535  . 0.9 %  sodium chloride infusion  250 mL Intravenous PRN Runell Gess, MD      . acetaminophen (TYLENOL) tablet 650 mg  650 mg Oral Q4H PRN Runell Gess, MD      . Melene Muller ON 02/17/2020] aspirin chewable tablet 81 mg  81 mg Oral Pre-Cath Runell Gess, MD      . Melene Muller ON 02/18/2020] aspirin chewable tablet 81 mg  81 mg Oral Daily Runell Gess, MD      . Melene Muller ON 02/17/2020] atorvastatin (LIPITOR) tablet 80 mg  80 mg Oral Daily Runell Gess, MD      . Melene Muller ON 02/17/2020] cefUROXime (ZINACEF) 1.5 g in sodium chloride 0.9 %  100 mL IVPB  1.5 g Intravenous To OR Donata Clay, Theron Arista, MD      . Melene Muller ON 02/17/2020] cefUROXime (ZINACEF) 750 mg in sodium chloride 0.9 % 100 mL IVPB  750 mg Intravenous To OR Donata Clay, Theron Arista, MD      . Melene Muller ON 02/17/2020] dexmedetomidine (PRECEDEX) 400 MCG/100ML (4 mcg/mL) infusion  0.1-0.7 mcg/kg/hr Intravenous To OR Donata Clay, Theron Arista, MD      . Melene Muller ON 02/17/2020] EPINEPHrine (ADRENALIN) 4 mg in NS 250 mL (0.016 mg/mL) premix infusion  0-10 mcg/min Intravenous To OR Donata Clay, Theron Arista, MD      . folic acid (FOLVITE) tablet 1 mg  1 mg Oral Daily Runell Gess, MD      . Melene Muller ON 02/17/2020] heparin 30,000 units/NS 1000 mL solution for CELLSAVER   Other To OR Donata Clay, Theron Arista, MD      . heparin ADULT infusion 100 units/mL (25000 units/258mL sodium chloride 0.45%)  1,000 Units/hr Intravenous Continuous Runell Gess, MD      . Melene Muller ON 02/17/2020] heparin sodium (porcine) 2,500 Units, papaverine 30 mg in electrolyte-148 (PLASMALYTE-148) 500 mL irrigation   Irrigation To OR Donata Clay, Theron Arista, MD      . hydrALAZINE (APRESOLINE) injection 10 mg  10 mg Intravenous Q20 Min PRN Runell Gess, MD      . Melene Muller ON 02/17/2020] insulin regular, human (MYXREDLIN) 100 units/ 100 mL infusion   Intravenous To OR Donata Clay, Theron Arista, MD      . labetalol (NORMODYNE) injection 10 mg  10 mg Intravenous Q10 min PRN Runell Gess, MD      . LORazepam (ATIVAN) tablet 1-4 mg  1-4 mg Oral Q1H PRN Runell Gess, MD       Or  . LORazepam (ATIVAN) injection 1-4 mg  1-4 mg Intravenous Q1H PRN Runell Gess, MD      . LORazepam (ATIVAN) tablet 0-4 mg  0-4 mg Oral Q6H Runell Gess, MD       Followed by  . [START ON 02/18/2020] LORazepam (ATIVAN) tablet 0-4 mg  0-4 mg Oral Q12H Runell Gess, MD      . [  START ON 02/17/2020] magnesium sulfate (IV Push/IM) injection 40 mEq  40 mEq Other To OR Donata Clay, Theron Arista, MD      . Melene Muller ON 02/17/2020] milrinone (PRIMACOR) 20 MG/100 ML (0.2 mg/mL) infusion  0.3  mcg/kg/min Intravenous To OR Donata Clay, Theron Arista, MD      . morphine 2 MG/ML injection 2 mg  2 mg Intravenous Q1H PRN Runell Gess, MD      . multivitamin with minerals tablet 1 tablet  1 tablet Oral Daily Runell Gess, MD      . nicotine (NICODERM CQ - dosed in mg/24 hours) patch 21 mg  21 mg Transdermal Once Runell Gess, MD      . nitroGLYCERIN (NITROSTAT) SL tablet 0.4 mg  0.4 mg Sublingual Q5 min PRN Runell Gess, MD   0.4 mg at 02/16/20 0843  . nitroGLYCERIN 50 mg in dextrose 5 % 250 mL (0.2 mg/mL) infusion  0-200 mcg/min Intravenous Titrated Runell Gess, MD   Paused at 02/16/20 1350  . [START ON 02/17/2020] nitroGLYCERIN 50 mg in dextrose 5 % 250 mL (0.2 mg/mL) infusion  2-200 mcg/min Intravenous To OR Donata Clay, Theron Arista, MD      . Melene Muller ON 02/17/2020] norepinephrine (LEVOPHED) 4mg  in premix infusion  0-40 mcg/min Intravenous To OR , Donata Clay, MD      . ondansetron [facility]) injection 4 mg  4 mg Intravenous Q6H PRN [facility], MD      . Runell Gess ON 02/17/2020] phenylephrine (NEOSYNEPHRINE) 20-0.9 MG/250ML-% infusion  30-200 mcg/min Intravenous To OR 04/18/2020, Donata Clay, MD      . Theron Arista ON 02/17/2020] potassium chloride injection 80 mEq  80 mEq Other To OR 04/18/2020, Donata Clay, MD      . sodium chloride flush (NS) 0.9 % injection 3 mL  3 mL Intravenous Once Theron Arista, MD      . sodium chloride flush (NS) 0.9 % injection 3 mL  3 mL Intravenous Q12H Runell Gess, MD      . sodium chloride flush (NS) 0.9 % injection 3 mL  3 mL Intravenous Q12H Runell Gess, MD      . sodium chloride flush (NS) 0.9 % injection 3 mL  3 mL Intravenous PRN Runell Gess, MD      . thiamine tablet 100 mg  100 mg Oral Daily Runell Gess, MD       Or  . thiamine (B-1) injection 100 mg  100 mg Intravenous Daily Runell Gess, MD      . Runell Gess ON 02/17/2020] tranexamic acid (CYKLOKAPRON) 2,500 mg in sodium chloride 0.9 % 250 mL (10 mg/mL) infusion  1.5 mg/kg/hr  Intravenous To OR 04/18/2020, Donata Clay, MD      . Theron Arista ON 02/17/2020] tranexamic acid (CYKLOKAPRON) bolus via infusion - over 30 minutes 1,258.5 mg  15 mg/kg Intravenous To OR 04/18/2020, Donata Clay, MD      . Theron Arista ON 02/17/2020] tranexamic acid (CYKLOKAPRON) pump prime solution 168 mg  2 mg/kg Intracatheter To OR 04/18/2020, Donata Clay, MD      . Theron Arista ON 02/17/2020] vancomycin (VANCOREADY) IVPB 1250 mg/250 mL  1,250 mg Intravenous To OR 04/18/2020, Donata Clay, MD        No medications prior to admission.    Family History  Problem Relation Age of Onset  . Heart attack Mother   . Heart attack Father   . Heart failure Father   . Healthy Brother  Review of Systems:   ROS    Cardiac Review of Systems: Y or  [    ]= no  Chest Pain [  Y  ]  Resting SOB [ N  ] Exertional SOB  [ N ]  Orthopnea [  ]   Pedal Edema Klaus.Mock[N   ]    Palpitations Klaus.Mock[N  ] Syncope  Klaus.Mock[N ]   Presyncope [   ]  General Review of Systems: [Y] = yes [  ]=no Constitional: recent weight change [  ]; anorexia [  ]; fatigue [N  ]; nausea Klaus.Mock[N  ]; night sweats [  ]; fever [  ]; or chills [  ]                                                               Dental: Last Dentist visit:   Eye : blurred vision [  ]; diplopia [   ]; vision changes Klaus.Mock[N  ];  Amaurosis fugax[  ]; Resp: cough [ Y ];  wheezing[  ];  hemoptysis[N  ]; shortness of breath[N  ]; paroxysmal nocturnal dyspnea[  ]; dyspnea on exertion[  ]; or orthopnea[  ];  GI:  gallstones[  ], vomiting[N  ];  dysphagia[  ]; melena[  ];  hematochezia [  ]; heartburn[  ];   Hx of  Colonoscopy[  ]; GU: kidney stones [  ]; hematuria[  ];   dysuria [  ];  nocturia[  ];  history of     obstruction [  ]; urinary frequency [  ]             Skin: rash, swelling[ N ];, hair loss[  ];  peripheral edema[ N ];  or itching[  ]; Musculosketetal: myalgias[  ];  joint swelling[  ];  joint erythema[  ];  joint pain[  ];  back pain[  ];  Heme/Lymph: bruising[  ];  bleeding[  ];  anemia[  ];  Neuro: TIA[ N ];   headaches[  ];  stroke[N  ];  vertigo[  ];  seizures[  ];   paresthesias[  ];  difficulty walking[N  ];  Psych:depression[N  ]; anxiety[ N ];  Endocrine: diabetes[  ];  thyroid dysfunction[  ];  Physical Exam: BP (!) 136/108   Pulse 66   Temp 97.6 F (36.4 C) (Oral)   Resp 16   Ht 5\' 11"  (1.803 m)   Wt 83.9 kg   SpO2 95%   BMI 25.80 kg/m   General appearance: alert, cooperative and no distress Head: Normocephalic, without obvious abnormality, atraumatic Neck: no adenopathy, no carotid bruit, no JVD, supple, symmetrical, trachea midline and thyroid not enlarged, symmetric, no tenderness/mass/nodules Resp: clear to auscultation bilaterally Cardio: regular rate and rhythm GI: soft, non-tender; bowel sounds normal; no masses,  no organomegaly Extremities: extremities normal, atraumatic, no cyanosis or edema Neurologic: Grossly normal  Diagnostic Studies & Laboratory data:     Recent Radiology Findings:   DG Chest 2 View  Result Date: 02/16/2020 CLINICAL DATA:  Chest pain. EXAM: CHEST - 2 VIEW COMPARISON:  CT chest dated April 26, 2013. Chest x-ray dated Jan 20, 2004. FINDINGS: The heart size and mediastinal contours are within normal limits. Both lungs are clear. The visualized skeletal structures are  unremarkable. IMPRESSION: No active cardiopulmonary disease. Electronically Signed   By: Titus Dubin M.D.   On: 02/16/2020 10:25   CARDIAC CATHETERIZATION  Result Date: 02/16/2020  Ramus lesion is 90% stenosed.  Prox Cx lesion is 60% stenosed.  Ost LM to Mid LM lesion is 25% stenosed.  Mid LAD to Dist LAD lesion is 90% stenosed.  Mid LAD lesion is 90% stenosed.  Ost LAD to Prox LAD lesion is 70% stenosed.  Prox RCA to Mid RCA lesion is 65% stenosed.  There is mild to moderate left ventricular systolic dysfunction.  LV end diastolic pressure is mildly elevated.  The left ventricular ejection fraction is 45-50% by visual estimate.  Karl Garcia is a 58 y.o. male  [id number]  LOCATION:  FACILITY: [facility] PHYSICIAN: Quay Burow, M.D. [birth year] DATE OF PROCEDURE:  02/16/2020 DATE OF DISCHARGE: CARDIAC CATHETERIZATION History obtained from chart review.  [name] is a 59 year old Caucasian male without prior cardiac history who has a history of tobacco abuse and ethanol abuse.  He was awakened from sleep early this morning with chest back and arm pain.  He came to the ER and was treated with IV nitroglycerin and heparin.  His troponins were mildly elevated at 100.  His EKG showed nonspecific changes.  He was referred for diagnostic coronary angiography.   [name] has diffuse high-grade proximal and mid LAD disease as well as a large ramus branch that has proximal and mid disease as well.  Both vessels are calcified.  The circumflex is nondominant and has a noncritical lesion in the mid AV groove.  The RCA is large dominant vessel with a 60% smooth lesion in the mid vessel.  His EF is approximate 45% with anteroapical hypokinesia suggesting his infarct was related to his LAD.  I do not believe his LAD is percutaneously addressable nor is his ramus branch.  We will get T CTS to consult.  The sheath was removed and a TR band was placed on the right wrist to achieve patent hemostasis.  The patient left lab in stable condition.  Heparin will be restarted in 4 hours. Quay Burow. MD, [facility] 02/16/2020 2:47 PM     I have independently reviewed the above radiologic studies and discussed with the patient   Recent Lab Findings: Lab Results  Component Value Date   WBC 5.3 02/16/2020   HGB 15.0 02/16/2020   HCT 44.9 02/16/2020   PLT 157 02/16/2020   GLUCOSE 136 (H) 02/16/2020   ALT 29 02/16/2020   AST 33 02/16/2020   NA 140 02/16/2020   K 3.4 (L) 02/16/2020   CL 104 02/16/2020   CREATININE 0.62 02/16/2020   BUN 13 02/16/2020   CO2 21 (L) 02/16/2020   TSH 0.656 02/16/2020   INR 0.96 09/23/2013   HGBA1C 6.8 (H) 02/16/2020   Assessment / Plan:       1. CAD- requesting  CABG, patient currently with mild chest pain off and on--- control per Cardiology.. for CABG tomorrow 2. Nicotine abuse- nicotine patch offered in ED 3. ETOH abuse- will need to monitor for withdrawal closely, especially post operatively 4. Dispo- continue preoperative workup, mild chest pain management per Cardiology, for CABG in AM, Dr. Prescott Gum will speak with patient   I  spent 55 minutes counseling the patient face to face.   Ellwood Handler, PA-C  02/16/2020 4:22 PM  Patient examined, images of coronary angiogram and echocardiogram personally reviewed and discussed with patient and wife.  73 year old smoker with  nocturnal angina and positive cardiac enzymes consistent with non-ST elevation MI. Catheterization documents severe three-vessel CAD with fairly well preserved LV function.  The LAD has a very tight 99% stenosis.  Echocardiogram shows no significant valvular disease.  He will be prepared for urgent multivessel CABG tomorrow a.m.  I have discussed the procedure in detail with the patient and his wife including indications benefits and risks.  He understands and agrees to proceed with surgery tomorrow.

## 2020-02-16 NOTE — ED Notes (Signed)
Patient transported to cath lab by this RN and Sophee Mckimmy Aus, EDT

## 2020-02-16 NOTE — Interval H&P Note (Signed)
Cath Lab Visit (complete for each Cath Lab visit)  Clinical Evaluation Leading to the Procedure:   ACS: Yes.    Non-ACS:    Anginal Classification: CCS II  Anti-ischemic medical therapy: No Therapy  Non-Invasive Test Results: No non-invasive testing performed  Prior CABG: No previous CABG      History and Physical Interval Note:  02/16/2020 1:49 PM  Karl Garcia  has presented today for surgery, with the diagnosis of Nonstemi.  The various methods of treatment have been discussed with the patient and family. After consideration of risks, benefits and other options for treatment, the patient has consented to  Procedure(s): LEFT HEART CATH AND CORONARY ANGIOGRAPHY (N/A) as a surgical intervention.  The patient's history has been reviewed, patient examined, no change in status, stable for surgery.  I have reviewed the patient's chart and labs.  Questions were answered to the patient's satisfaction.     Nanetta Batty

## 2020-02-16 NOTE — ED Notes (Signed)
Patient's significant other requested to speak with this RN while patient in xray. She advised this RN that patient drinks heavy amounts of alcohol (1 pint-1/5th of liquor) a day and that patient would most likely not admit this to care team. She wanted to make [place] aware so that patient gets proper assessment/treatment while in hospital to prevent withdrawal. She reports pts last drink was last night.

## 2020-02-16 NOTE — ED Notes (Signed)
Cath procedure explained to patient by dr berry upon arrival to cath lab, consent not obtained in ED.

## 2020-02-16 NOTE — ED Notes (Signed)
pts O2 sat 89-92% on room air, this RN attempted to apply supplemental O2 via n.c., however, patient declined at this time. Pt educated on indication/importance of supplemental O2 but stated he would like to wait.

## 2020-02-16 NOTE — Progress Notes (Signed)
Pre-CABG Dopplers completed. Refer to "CV Proc" under chart review to view preliminary results.  02/16/2020 6:57 PM Eula Fried., MHA, RVT, RDCS, RDMS

## 2020-02-16 NOTE — Progress Notes (Signed)
Patient complained of increased achy chest pain, increased from a 4/10 to 5/10. Started nitroglycerin infusion and increased from 10 mcg to 50 mcg from 1725 to 1800. 1800 text paged PA on call and obtained a 12 lead ECG. Received reply that Dr. Tenny Craw would be up to see patient ( She had seen him in the ED today). Dr. Tenny Craw into see patient and orders received for medications. Medications given as ordered. Patient remained at 5/10 chest pain after medications given. Resting at this time.

## 2020-02-16 NOTE — ED Notes (Signed)
Admitting team at bedside.

## 2020-02-16 NOTE — H&P (View-Only) (Signed)
Cardiology Consultation:   Patient ID: Karl Garcia MRN: [id number]; DOB: 06-13-1961  Admit date: 02/16/2020 Date of Consult: 02/16/2020  Primary Care Provider: Patient, No Pcp Per [place] Cardiologist: No primary care provider on file. new [place] Electrophysiologist:  None    Patient Profile:   Karl Garcia is a 59 y.o. male with a hx of ETOH abuse and tobacco use who is being seen today for the evaluation of chest pain with elevated troponin at the request of Dr. Maryan Rued.  History of Present Illness:   Karl Garcia with no past cardiac hx but + ETOH abuse and tobacco abuse. No HTN, Diabetes, no CVA, and has not seen MD is some time.  Karl Garcia developed upper back pain a couple of days ago and then went into Left arm  Today pain in his chest and down Lt arm.  No associated symptoms of N<V, SOB or diaphoresis.   Has rec'd ASA 324 mg, 2 mg MSo4, and nicoderm patch.  3 NTG sl. Karl Garcia no longer has any pain.    Karl Garcia per significant other drinks 1 pint to 1/5 of liquor per day. Karl Garcia admits to 1 pint per day.   Karl Garcia did drink last pm.   Karl Garcia wanted to go out to smoke but agreed to nicotine patch.   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   EKG:  The EKG was personally reviewed and demonstrates:  SR at 75 possible Lt atrial enlargement and no acute ST changes.  Telemetry:  Telemetry was personally reviewed and demonstrates:  SR  Labs  Troponin 89 123 Na 140 ; K+ 3.4  Cr 0.162 Hgb 15, Hct 44.9  COVID pending CXR no active cardiopulmonary disease.  BP 186/92 190/100 P 57-73 afebrile.  sp02 89-60f2% RA Karl Garcia declined 02.   Past Medical History:  Diagnosis Date  . ETOH abuse   . ETOH abuse   . Tobacco abuse     Past Surgical History:  Procedure Laterality Date  . TONSILECTOMY, ADENOIDECTOMY, BILATERAL MYRINGOTOMY AND TUBES     unsure of all of that but + tonsilectomy     Home Medications:  Prior to Admission medications   Medication Sig Start Date End Date Taking?  Authorizing Provider  ibuprofen (ADVIL) 200 MG tablet Take 600 mg by mouth daily.   Yes [provider]    Inpatient Medications: Scheduled Meds: . nicotine  21 mg Transdermal Once  . sodium chloride flush  3 mL Intravenous Once   Continuous Infusions:  PRN Meds: nitroGLYCERIN  Allergies:   No Known Allergies  Social History:   Social History   Socioeconomic History  . Marital status: Single    Spouse name: Not on file  . Number of children: Not on file  . Years of education: Not on file  . Highest education level: Not on file  Occupational History  . Not on file  Tobacco Use  . Smoking status: Current Every Day Smoker    Packs/day: 1.50  . Smokeless tobacco: Never Used  Vaping Use  . Vaping Use: Never used  Substance and Sexual Activity  . Alcohol use: Yes    Comment: 1 pint of liquor per day   . Drug use: Never  . Sexual activity: Not on file  Other Topics Concern  . Not on file  Social History Narrative  . Not on file   Social Determinants of Health   Financial Resource Strain:   . Difficulty of Paying  Living Expenses:   Food Insecurity:   . Worried About Programme researcher, broadcasting/film/video in the Last Year:   . Barista in the Last Year:   Transportation Needs:   . Freight forwarder (Medical):   Marland Kitchen Lack of Transportation (Non-Medical):   Physical Activity:   . Days of Exercise per Week:   . Minutes of Exercise per Session:   Stress:   . Feeling of Stress :   Social Connections:   . Frequency of Communication with Friends and Family:   . Frequency of Social Gatherings with Friends and Family:   . Attends Religious Services:   . Active Member of Clubs or Organizations:   . Attends Banker Meetings:   Marland Kitchen Marital Status:   Intimate Partner Violence:   . Fear of Current or Ex-Partner:   . Emotionally Abused:   Marland Kitchen Physically Abused:   . Sexually Abused:     Family History:    Family History  Problem Relation Age of Onset  . Heart  attack Mother   . Heart attack Father   . Heart failure Father   . Healthy Brother      ROS:  Please see the history of present illness.  General:no colds or fevers, no weight changes Skin:no rashes or ulcers HEENT:no blurred vision, no congestion CV:see HPI PUL:see HPI GI:no diarrhea constipation or melena, no indigestion GU:no hematuria, no dysuria MS:no joint pain, ? Claudication pain in claves at times with ambulation and some in hips  Neuro:no syncope, no lightheadedness Endo:no diabetes, no thyroid disease  All other ROS reviewed and negative.     Physical Exam/Data:   Vitals:   02/16/20 0901 02/16/20 0907 02/16/20 0927 02/16/20 1058  BP: (!) 175/94   (!) 167/78  Pulse: (!) 56 (!) 54 68 (!) 54  Resp: 12 15 17 16   Temp:      SpO2: 93% 93% 91% 93%  Weight:      Height:       No intake or output data in the 24 hours ending 02/16/20 1128 Last 3 Weights 02/16/2020 09/23/2013 09/23/2013  Weight (lbs) 185 lb 160 lb 160 lb  Weight (kg) 83.915 kg 72.576 kg 72.576 kg     Body mass index is 25.8 kg/m.  General:  Well nourished, well developed, in no acute distress HEENT: normal Lymph: no adenopathy Neck: no JVD Endocrine:  No thryomegaly Vascular: No carotid bruits; pedal pulses 1+ bilaterally Cardiac:  normal S1, S2; RRR; no murmur gallup  Lungs:  clear to auscultation bilaterally, no wheezing, rhonchi or rales  Abd: soft, nontender, no hepatomegaly  Ext: no edema Musculoskeletal:  No deformities, BUE and BLE strength normal and equal Skin: warm and dry  Neuro:  Alert and oriented X 3 MAE follows commands, no focal abnormalities noted Psych:  Normal affect   Relevant CV Studies: none  Laboratory Data:  High Sensitivity Troponin:   Recent Labs  Lab 02/16/20 0715 02/16/20 0940  TROPONINIHS 89* 123*     Chemistry Recent Labs  Lab 02/16/20 0715  NA 140  K 3.4*  CL 104  CO2 21*  GLUCOSE 136*  BUN 13  CREATININE 0.62  CALCIUM 8.6*  GFRNONAA >60    GFRAA >60  ANIONGAP 15    No results for input(s): PROT, ALBUMIN, AST, ALT, ALKPHOS, BILITOT in the last 168 hours. Hematology Recent Labs  Lab 02/16/20 0715  WBC 5.3  RBC 4.46  HGB 15.0  HCT 44.9  MCV  100.7*  MCH 33.6  MCHC 33.4  RDW 13.1  PLT 157   BNPNo results for input(s): BNP, PROBNP in the last 168 hours.  DDimer No results for input(s): DDIMER in the last 168 hours.   Radiology/Studies:  DG Chest 2 View  Result Date: 02/16/2020 CLINICAL DATA:  Chest pain. EXAM: CHEST - 2 VIEW COMPARISON:  CT chest dated April 26, 2013. Chest x-ray dated Jan 20, 2004. FINDINGS: The heart size and mediastinal contours are within normal limits. Both lungs are clear. The visualized skeletal structures are unremarkable. IMPRESSION: No active cardiopulmonary disease. Electronically Signed   By: Obie Dredge M.D.   On: 02/16/2020 10:25       HEAR Score (for undifferentiated chest pain):     4   Assessment and Plan:   1. Chest pain with radiation to Lt arm, aching pain with troponin hs of 123 peak. NTG X 3 given then IV morphine.   BP was elevated on arrival.  With SL NTG it has improved.  No prior hx but has not seen MD in some time.  Karl Garcia is active in yard and as stone [employer].  Admit to eval.  Karl Garcia is not sure Karl Garcia will stay but is willing to hear recommendations. Echo, possible cardiac CTA vs cath Dr. Tenny Craw to see.  If stays add IV heparin. Will check amylase and lipase as well.  Use NTG paste  Check echo 2. HTN, here but no prior hx  No recent Medical care.   3. Tobacco use, continue nicoderm patch- 1.5 PPD for 50 years  4. ETOH use 1 pint to 1/5th per night. For about 50 years.  Needs ETOH withdrawal protocol 5. Possible PAD, with calf pain bilateral with ambulation.      For questions or updates, please contact [facility] Please consult [email or web address] for contact info under    Signed, Nada Boozer, NP  02/16/2020 11:28 AM   Patient seen and examined   I agree with findings as  noted  Karl Garcia is a 59 yo with hx of tobacco use, EtOH abuse   Presents to ED with back pain/ chest pain  No associated symptoms   ON exam, Karl Garcia in NAD   Still with some chest discomfort  BP is elevated   Sats 93% (RA) Neck:  JVP is normal Lungs are relatively clear Cardiac exam   RRR  No S3 No S4 No signif murmurs  Abd  No RUQ tenderness  Supple  Ext are witiout edema  1+ pulses  EKG without acute changes  Trop elevated at 89 then second was 123  Impression: Unstable angina.   Karl Garcia had plaquing on aorta in CT scan 7 years ago   With elevated troponin I would reocmm  L heart cath  Risks/benefits decribed  Karl Garcia understands and agrees to proceed  2.  HTN   Follow BP  Start NTG now  3  Substance abuse   Watch for withdrawal  Counselled on cessation  Dietrich Pates MD

## 2020-02-16 NOTE — ED Notes (Signed)
Patient asking to go outside to smoke, this RN advised patient we need him to remain inside ED, offered nicotine patch which patient was receptive to. PA Roxan Hockey made aware of need for nicotine patch at this time.

## 2020-02-16 NOTE — Progress Notes (Signed)
  Echocardiogram 2D Echocardiogram definity has been performed.  Leta Jungling M 02/16/2020, 4:54 PM

## 2020-02-16 NOTE — ED Notes (Signed)
Dr. Tenny Craw, Cardiologist at bedside

## 2020-02-16 NOTE — Progress Notes (Signed)
ANTICOAGULATION CONSULT NOTE - Initial Consult  Pharmacy Consult for heparin Indication: chest pain/ACS  No Known Allergies  Patient Measurements: Height: 5\' 11"  (180.3 cm) Weight: 83.9 kg (185 lb) IBW/kg (Calculated) : 75.3 Heparin Dosing Weight: TWB  Vital Signs: Temp: 97.5 F (36.4 C) (06/10 0711) BP: 184/90 (06/10 1158) Pulse Rate: 66 (06/10 1228)  Labs: Recent Labs    02/16/20 0715 02/16/20 0940  HGB 15.0  --   HCT 44.9  --   PLT 157  --   CREATININE 0.62  --   TROPONINIHS 89* 123*    Estimated Creatinine Clearance: 103.3 mL/min (by C-G formula based on SCr of 0.62 mg/dL).   Medical History: Past Medical History:  Diagnosis Date  . ETOH abuse   . ETOH abuse   . Tobacco abuse    Assessment: 79 YOM presenting with CP and arm numbness.  No anticoagulation PTA.  CBC wnl.  Goal of Therapy:  Heparin level 0.3-0.7 units/ml Monitor platelets by anticoagulation protocol: Yes   Plan:  Heparin 4000 units IV x 1, and gtt at 1000 units/hr F/u 6 hour heparin level  77, PharmD Clinical Pharmacist ED Pharmacist Phone # [phone] 02/16/2020 12:38 PM

## 2020-02-16 NOTE — ED Provider Notes (Signed)
[facility] EMERGENCY DEPARTMENT Provider Note   CSN: [id number] Arrival date & time: 02/16/20  8185     History Chief Complaint  Patient presents with  . Chest Pain  . Arm numbness    Karl Garcia is a 59 y.o. male w PMHx alcohol abuse, tobacco use, presenting to the ED with complaint of left sided chest pain and arm pain that began yesterday. Pt reports symptoms began yesterday with left shoulder pain and he woke up this morning with pain down through his left arm and in his left chest.  It is described as an aching pain that is constant in nature.  It is not made worse or better with movement.  He has not attempted to exert himself since the pain began.  He denies associated nausea, diaphoresis, shortness of breath.  He does not have a primary medical provider nor does he take any daily medications.  He endorses tobacco use and daily alcohol use.  He states he drinks 1 pint of liquor daily, last drink was last night.  He denies any known complications with alcohol withdrawal.  The history is provided by the patient.       Past Medical History:  Diagnosis Date  . ETOH abuse   . ETOH abuse   . Tobacco abuse     There are no problems to display for this patient.   Past Surgical History:  Procedure Laterality Date  . TONSILECTOMY, ADENOIDECTOMY, BILATERAL MYRINGOTOMY AND TUBES     unsure of all of that but + tonsilectomy       Family History  Problem Relation Age of Onset  . Heart attack Mother   . Heart attack Father   . Heart failure Father   . Healthy Brother     Social History   Tobacco Use  . Smoking status: Current Every Day Smoker    Packs/day: 1.50  . Smokeless tobacco: Never Used  Vaping Use  . Vaping Use: Never used  Substance Use Topics  . Alcohol use: Yes    Comment: 1 pint of liquor per day   . Drug use: Never    Home Medications Prior to Admission medications   Medication Sig Start Date End Date Taking? Authorizing  Provider  ibuprofen (ADVIL) 200 MG tablet Take 600 mg by mouth daily.   Yes [provider]    Allergies    Patient has no known allergies.  Review of Systems   Review of Systems  Cardiovascular: Positive for chest pain.       Arm pain  All other systems reviewed and are negative.   Physical Exam Updated Vital Signs BP (!) 196/93   Pulse (!) 58   Temp (!) 97.5 F (36.4 C)   Resp 15   Ht 5\' 11"  (1.803 m)   Wt 83.9 kg   SpO2 93%   BMI 25.80 kg/m   Physical Exam Vitals and nursing note reviewed.  Constitutional:      Appearance: He is well-developed. He is not ill-appearing.  HENT:     Head: Normocephalic and atraumatic.  Eyes:     Conjunctiva/sclera: Conjunctivae normal.  Cardiovascular:     Rate and Rhythm: Normal rate and regular rhythm.     Heart sounds: Normal heart sounds.     Comments: Normal radial pulses Normal DP pulses Pulmonary:     Effort: Pulmonary effort is normal. No respiratory distress.     Breath sounds: Normal breath sounds.  Comments: Mild TTP to left pectoral region Abdominal:     General: Bowel sounds are normal.     Palpations: Abdomen is soft.     Tenderness: There is no abdominal tenderness.  Musculoskeletal:     Right lower leg: No edema.     Left lower leg: No edema.     Comments: Mild TTP to left trapezius muscle group. Pain not made worse with ROM of the left shoulder.   Skin:    General: Skin is warm.  Neurological:     Mental Status: He is alert.     Comments: Normal sensation BUE. 5/5 grip strength BUE  Psychiatric:        Behavior: Behavior normal.     ED Results / Procedures / Treatments   Labs (all labs ordered are listed, but only abnormal results are displayed) Labs Reviewed  BASIC METABOLIC PANEL - Abnormal; Notable for the following components:      Result Value   Potassium 3.4 (*)    CO2 21 (*)    Glucose, Bld 136 (*)    Calcium 8.6 (*)    All other components within normal limits  CBC -  Abnormal; Notable for the following components:   MCV 100.7 (*)    All other components within normal limits  HEMOGLOBIN A1C - Abnormal; Notable for the following components:   Hgb A1c MFr Bld 6.8 (*)    All other components within normal limits  TROPONIN I (HIGH SENSITIVITY) - Abnormal; Notable for the following components:   Troponin I (High Sensitivity) 89 (*)    All other components within normal limits  TROPONIN I (HIGH SENSITIVITY) - Abnormal; Notable for the following components:   Troponin I (High Sensitivity) 123 (*)    All other components within normal limits  SARS CORONAVIRUS 2 BY RT PCR (HOSPITAL ORDER, PERFORMED IN [place] [facility])  AMYLASE  LIPASE, BLOOD  MAGNESIUM  TSH  T4, FREE  HEPATIC FUNCTION PANEL  HEPARIN LEVEL (UNFRACTIONATED)    EKG EKG Interpretation  Date/Time:  Thursday February 16 2020 07:00:37 EDT Ventricular Rate:  75 PR Interval:  144 QRS Duration: 78 QT Interval:  362 QTC Calculation: 404 R Axis:   49 Text Interpretation: Normal sinus rhythm Possible Left atrial enlargement Septal infarct , age undetermined Confirmed by Gwyneth Sprout ([id number]) on 02/16/2020 8:28:21 AM   Radiology DG Chest 2 View  Result Date: 02/16/2020 CLINICAL DATA:  Chest pain. EXAM: CHEST - 2 VIEW COMPARISON:  CT chest dated April 26, 2013. Chest x-ray dated Jan 20, 2004. FINDINGS: The heart size and mediastinal contours are within normal limits. Both lungs are clear. The visualized skeletal structures are unremarkable. IMPRESSION: No active cardiopulmonary disease. Electronically Signed   By: Obie Dredge M.D.   On: 02/16/2020 10:25    Procedures Procedures (including critical care time)  Medications Ordered in ED Medications  sodium chloride flush (NS) 0.9 % injection 3 mL (3 mLs Intravenous Not Given 02/16/20 0800)  nitroGLYCERIN (NITROSTAT) SL tablet 0.4 mg (0.4 mg Sublingual Given 02/16/20 0843)  nicotine (NICODERM CQ - dosed in mg/24 hours) patch 21 mg  (21 mg Transdermal Refused 02/16/20 1027)  potassium chloride SA (KLOR-CON) CR tablet 40 mEq (has no administration in time range)  sodium chloride flush (NS) 0.9 % injection 3 mL (3 mLs Intravenous Not Given 02/16/20 1229)  aspirin chewable tablet 81 mg (has no administration in time range)  0.9 %  sodium chloride infusion (has no administration in time range)  nitroGLYCERIN 50 mg in dextrose 5 % 250 mL (0.2 mg/mL) infusion (has no administration in time range)  heparin bolus via infusion 4,000 Units (has no administration in time range)  heparin ADULT infusion 100 units/mL (25000 units/21mL sodium chloride 0.45%) (has no administration in time range)  aspirin chewable tablet 324 mg (324 mg Oral Given 02/16/20 0831)  morphine 2 MG/ML injection 2 mg (2 mg Intravenous Given 02/16/20 0949)    ED Course  I have reviewed the triage vital signs and the nursing notes.  Pertinent labs & imaging results that were available during my care of the patient were reviewed by me and considered in my medical decision making (see chart for details).  Clinical Course as of Feb 15 1321  Thu Feb 16, 2020  0927 PT reporting persistent chest pain, though improved with NTG and ASA. Will give dose of morphine and consult cardiology.   [JR]  [phone] Cardiology to evaluate patient.   [JR]  1059 Troponin I (High Sensitivity)(!!): 123 [JR]    Clinical Course User Index [JR] Tawny Raspberry, [place] N, PA-C   MDM Rules/Calculators/A&P                          Pt presenting to the ED with left sided chest pain and arm pain since yesterday. Pain is described as aching and constant. Pt does not regularly follow with a medical provider. 1 ppd tobacco use and 1 pint liquor daily use. He is noted to be hypertensive on arrival with active left sided chest pain. No STEMI, nonspecific repol abnormalities. He is given 324 ASA, and NTG x3 with slight improvement though continues to have active pain. Morphine ordered. Iniital trop is  elevated at 89. Concern for ACS, cardiology consulted given persistent pain with elev troponin. Pt agreeable to admission.   Repeat trop is 123. Cardiology requesting medical admission due to alcohol abuse. Internal medicine accepting admission.  Final Clinical Impression(s) / ED Diagnoses Final diagnoses:  NSTEMI (non-ST elevated myocardial infarction) [facility])    Rx / DC Orders ED Discharge Orders    None       Tilak Oakley, [place] N, PA-C 02/16/20 1323    Blanchie Dessert, MD 02/17/20 1844

## 2020-02-16 NOTE — Consult Note (Addendum)
Cardiology Consultation:   Patient ID: Karl Garcia MRN: [id number]; DOB: 06-13-1961  Admit date: 02/16/2020 Date of Consult: 02/16/2020  Primary Care Provider: Patient, No Pcp Per [place] Cardiologist: No primary care provider on file. new [place] Electrophysiologist:  None    Patient Profile:   Karl Garcia is a 59 y.o. male with a hx of ETOH abuse and tobacco use who is being seen today for the evaluation of chest pain with elevated troponin at the request of Dr. Maryan Rued.  History of Present Illness:   Mr. Konecny with no past cardiac hx but + ETOH abuse and tobacco abuse. No HTN, Diabetes, no CVA, and has not seen MD is some time.  He developed upper back pain a couple of days ago and then went into Left arm  Today pain in his chest and down Lt arm.  No associated symptoms of N<V, SOB or diaphoresis.   Has rec'd ASA 324 mg, 2 mg MSo4, and nicoderm patch.  3 NTG sl. He no longer has any pain.    Pt per significant other drinks 1 pint to 1/5 of liquor per day. He admits to 1 pint per day.   He did drink last pm.   He wanted to go out to smoke but agreed to nicotine patch.   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   EKG:  The EKG was personally reviewed and demonstrates:  SR at 75 possible Lt atrial enlargement and no acute ST changes.  Telemetry:  Telemetry was personally reviewed and demonstrates:  SR  Labs  Troponin 89 123 Na 140 ; K+ 3.4  Cr 0.162 Hgb 15, Hct 44.9  COVID pending CXR no active cardiopulmonary disease.  BP 186/92 190/100 P 57-73 afebrile.  sp02 89-60f2% RA pt declined 02.   Past Medical History:  Diagnosis Date  . ETOH abuse   . ETOH abuse   . Tobacco abuse     Past Surgical History:  Procedure Laterality Date  . TONSILECTOMY, ADENOIDECTOMY, BILATERAL MYRINGOTOMY AND TUBES     unsure of all of that but + tonsilectomy     Home Medications:  Prior to Admission medications   Medication Sig Start Date End Date Taking?  Authorizing Provider  ibuprofen (ADVIL) 200 MG tablet Take 600 mg by mouth daily.   Yes [provider]    Inpatient Medications: Scheduled Meds: . nicotine  21 mg Transdermal Once  . sodium chloride flush  3 mL Intravenous Once   Continuous Infusions:  PRN Meds: nitroGLYCERIN  Allergies:   No Known Allergies  Social History:   Social History   Socioeconomic History  . Marital status: Single    Spouse name: Not on file  . Number of children: Not on file  . Years of education: Not on file  . Highest education level: Not on file  Occupational History  . Not on file  Tobacco Use  . Smoking status: Current Every Day Smoker    Packs/day: 1.50  . Smokeless tobacco: Never Used  Vaping Use  . Vaping Use: Never used  Substance and Sexual Activity  . Alcohol use: Yes    Comment: 1 pint of liquor per day   . Drug use: Never  . Sexual activity: Not on file  Other Topics Concern  . Not on file  Social History Narrative  . Not on file   Social Determinants of Health   Financial Resource Strain:   . Difficulty of Paying  Living Expenses:   Food Insecurity:   . Worried About Running Out of Food in the Last Year:   . Ran Out of Food in the Last Year:   Transportation Needs:   . Lack of Transportation (Medical):   . Lack of Transportation (Non-Medical):   Physical Activity:   . Days of Exercise per Week:   . Minutes of Exercise per Session:   Stress:   . Feeling of Stress :   Social Connections:   . Frequency of Communication with Friends and Family:   . Frequency of Social Gatherings with Friends and Family:   . Attends Religious Services:   . Active Member of Clubs or Organizations:   . Attends Club or Organization Meetings:   . Marital Status:   Intimate Partner Violence:   . Fear of Current or Ex-Partner:   . Emotionally Abused:   . Physically Abused:   . Sexually Abused:     Family History:    Family History  Problem Relation Age of Onset  . Heart  attack Mother   . Heart attack Father   . Heart failure Father   . Healthy Brother      ROS:  Please see the history of present illness.  General:no colds or fevers, no weight changes Skin:no rashes or ulcers HEENT:no blurred vision, no congestion CV:see HPI PUL:see HPI GI:no diarrhea constipation or melena, no indigestion GU:no hematuria, no dysuria MS:no joint pain, ? Claudication pain in claves at times with ambulation and some in hips  Neuro:no syncope, no lightheadedness Endo:no diabetes, no thyroid disease  All other ROS reviewed and negative.     Physical Exam/Data:   Vitals:   02/16/20 0901 02/16/20 0907 02/16/20 0927 02/16/20 1058  BP: (!) 175/94   (!) 167/78  Pulse: (!) 56 (!) 54 68 (!) 54  Resp: 12 15 17 16  Temp:      SpO2: 93% 93% 91% 93%  Weight:      Height:       No intake or output data in the 24 hours ending 02/16/20 1128 Last 3 Weights 02/16/2020 09/23/2013 09/23/2013  Weight (lbs) 185 lb 160 lb 160 lb  Weight (kg) 83.915 kg 72.576 kg 72.576 kg     Body mass index is 25.8 kg/m.  General:  Well nourished, well developed, in no acute distress HEENT: normal Lymph: no adenopathy Neck: no JVD Endocrine:  No thryomegaly Vascular: No carotid bruits; pedal pulses 1+ bilaterally Cardiac:  normal S1, S2; RRR; no murmur gallup  Lungs:  clear to auscultation bilaterally, no wheezing, rhonchi or rales  Abd: soft, nontender, no hepatomegaly  Ext: no edema Musculoskeletal:  No deformities, BUE and BLE strength normal and equal Skin: warm and dry  Neuro:  Alert and oriented X 3 MAE follows commands, no focal abnormalities noted Psych:  Normal affect   Relevant CV Studies: none  Laboratory Data:  High Sensitivity Troponin:   Recent Labs  Lab 02/16/20 0715 02/16/20 0940  TROPONINIHS 89* 123*     Chemistry Recent Labs  Lab 02/16/20 0715  NA 140  K 3.4*  CL 104  CO2 21*  GLUCOSE 136*  BUN 13  CREATININE 0.62  CALCIUM 8.6*  GFRNONAA >60    GFRAA >60  ANIONGAP 15    No results for input(s): PROT, ALBUMIN, AST, ALT, ALKPHOS, BILITOT in the last 168 hours. Hematology Recent Labs  Lab 02/16/20 0715  WBC 5.3  RBC 4.46  HGB 15.0  HCT 44.9  MCV   100.7*  MCH 33.6  MCHC 33.4  RDW 13.1  PLT 157   BNPNo results for input(s): BNP, PROBNP in the last 168 hours.  DDimer No results for input(s): DDIMER in the last 168 hours.   Radiology/Studies:  DG Chest 2 View  Result Date: 02/16/2020 CLINICAL DATA:  Chest pain. EXAM: CHEST - 2 VIEW COMPARISON:  CT chest dated April 26, 2013. Chest x-ray dated Jan 20, 2004. FINDINGS: The heart size and mediastinal contours are within normal limits. Both lungs are clear. The visualized skeletal structures are unremarkable. IMPRESSION: No active cardiopulmonary disease. Electronically Signed   By: Obie Dredge M.D.   On: 02/16/2020 10:25       HEAR Score (for undifferentiated chest pain):     4   Assessment and Plan:   1. Chest pain with radiation to Lt arm, aching pain with troponin hs of 123 peak. NTG X 3 given then IV morphine.   BP was elevated on arrival.  With SL NTG it has improved.  No prior hx but has not seen MD in some time.  He is active in yard and as stone [employer].  Admit to eval.  Pt is not sure he will stay but is willing to hear recommendations. Echo, possible cardiac CTA vs cath Dr. Tenny Craw to see.  If stays add IV heparin. Will check amylase and lipase as well.  Use NTG paste  Check echo 2. HTN, here but no prior hx  No recent Medical care.   3. Tobacco use, continue nicoderm patch- 1.5 PPD for 50 years  4. ETOH use 1 pint to 1/5th per night. For about 50 years.  Needs ETOH withdrawal protocol 5. Possible PAD, with calf pain bilateral with ambulation.      For questions or updates, please contact [facility] Please consult [email or web address] for contact info under    Signed, Nada Boozer, NP  02/16/2020 11:28 AM   Patient seen and examined   I agree with findings as  noted  Pt is a 59 yo with hx of tobacco use, EtOH abuse   Presents to ED with back pain/ chest pain  No associated symptoms   ON exam, Pt in NAD   Still with some chest discomfort  BP is elevated   Sats 93% (RA) Neck:  JVP is normal Lungs are relatively clear Cardiac exam   RRR  No S3 No S4 No signif murmurs  Abd  No RUQ tenderness  Supple  Ext are witiout edema  1+ pulses  EKG without acute changes  Trop elevated at 89 then second was 123  Impression: Unstable angina.   Pt had plaquing on aorta in CT scan 7 years ago   With elevated troponin I would reocmm  L heart cath  Risks/benefits decribed  Pt understands and agrees to proceed  2.  HTN   Follow BP  Start NTG now  3  Substance abuse   Watch for withdrawal  Counselled on cessation  Dietrich Pates MD

## 2020-02-17 ENCOUNTER — Encounter (HOSPITAL_COMMUNITY): Payer: Self-pay | Admitting: Cardiovascular Disease

## 2020-02-17 ENCOUNTER — Other Ambulatory Visit (HOSPITAL_COMMUNITY): Payer: Self-pay

## 2020-02-17 ENCOUNTER — Inpatient Hospital Stay (HOSPITAL_COMMUNITY): Payer: Self-pay

## 2020-02-17 ENCOUNTER — Inpatient Hospital Stay (HOSPITAL_COMMUNITY)
Admission: RE | Disposition: A | Discharge status: Home / Self Care | Payer: Self-pay | Source: Home / Self Care | Attending: Cardiothoracic Surgery

## 2020-02-17 DIAGNOSIS — Z951 Presence of aortocoronary bypass graft: Secondary | ICD-10-CM

## 2020-02-17 HISTORY — PX: TEE WITHOUT CARDIOVERSION: SHX5443

## 2020-02-17 HISTORY — PX: CORONARY ARTERY BYPASS GRAFT: SHX141

## 2020-02-17 LAB — POCT I-STAT, CHEM 8
BUN: 10 mg/dL (ref 8–23)
BUN: 8 mg/dL (ref 8–23)
BUN: 9 mg/dL (ref 8–23)
BUN: 11 mg/dL (ref 8–23)
BUN: 11 mg/dL (ref 8–23)
Calcium, Ion: 1.23 mmol/L (ref 1.15–1.40)
Calcium, Ion: 1.14 mmol/L — ABNORMAL LOW (ref 1.15–1.40)
Calcium, Ion: 1.06 mmol/L — ABNORMAL LOW (ref 1.15–1.40)
Calcium, Ion: 1.08 mmol/L — ABNORMAL LOW (ref 1.15–1.40)
Calcium, Ion: 1.08 mmol/L — ABNORMAL LOW (ref 1.15–1.40)
Chloride: 99 mmol/L (ref 98–111)
Chloride: 104 mmol/L (ref 98–111)
Chloride: 100 mmol/L (ref 98–111)
Chloride: 100 mmol/L (ref 98–111)
Chloride: 101 mmol/L (ref 98–111)
Creatinine, Ser: 0.5 mg/dL — ABNORMAL LOW (ref 0.61–1.24)
Creatinine, Ser: 0.4 mg/dL — ABNORMAL LOW (ref 0.61–1.24)
Creatinine, Ser: 0.4 mg/dL — ABNORMAL LOW (ref 0.61–1.24)
Creatinine, Ser: 0.5 mg/dL — ABNORMAL LOW (ref 0.61–1.24)
Creatinine, Ser: 0.5 mg/dL — ABNORMAL LOW (ref 0.61–1.24)
Glucose, Bld: 124 mg/dL — ABNORMAL HIGH (ref 70–99)
Glucose, Bld: 97 mg/dL (ref 70–99)
Glucose, Bld: 148 mg/dL — ABNORMAL HIGH (ref 70–99)
Glucose, Bld: 177 mg/dL — ABNORMAL HIGH (ref 70–99)
Glucose, Bld: 152 mg/dL — ABNORMAL HIGH (ref 70–99)
HCT: 40 % (ref 39.0–52.0)
HCT: 35 % — ABNORMAL LOW (ref 39.0–52.0)
HCT: 34 % — ABNORMAL LOW (ref 39.0–52.0)
HCT: 31 % — ABNORMAL LOW (ref 39.0–52.0)
HCT: 33 % — ABNORMAL LOW (ref 39.0–52.0)
Hemoglobin: 13.6 g/dL (ref 13.0–17.0)
Hemoglobin: 11.9 g/dL — ABNORMAL LOW (ref 13.0–17.0)
Hemoglobin: 11.6 g/dL — ABNORMAL LOW (ref 13.0–17.0)
Hemoglobin: 10.5 g/dL — ABNORMAL LOW (ref 13.0–17.0)
Hemoglobin: 11.2 g/dL — ABNORMAL LOW (ref 13.0–17.0)
Potassium: 3.9 mmol/L (ref 3.5–5.1)
Potassium: 3.6 mmol/L (ref 3.5–5.1)
Potassium: 4.9 mmol/L (ref 3.5–5.1)
Potassium: 5.3 mmol/L — ABNORMAL HIGH (ref 3.5–5.1)
Potassium: 4.7 mmol/L (ref 3.5–5.1)
Sodium: 138 mmol/L (ref 135–145)
Sodium: 141 mmol/L (ref 135–145)
Sodium: 136 mmol/L (ref 135–145)
Sodium: 135 mmol/L (ref 135–145)
Sodium: 138 mmol/L (ref 135–145)
TCO2: 28 mmol/L (ref 22–32)
TCO2: 24 mmol/L (ref 22–32)
TCO2: 25 mmol/L (ref 22–32)
TCO2: 25 mmol/L (ref 22–32)
TCO2: 28 mmol/L (ref 22–32)

## 2020-02-17 LAB — POCT I-STAT 7, (LYTES, BLD GAS, ICA,H+H)
Acid-Base Excess: 4 mmol/L — ABNORMAL HIGH (ref 0.0–2.0)
Acid-Base Excess: 4 mmol/L — ABNORMAL HIGH (ref 0.0–2.0)
Acid-Base Excess: 5 mmol/L — ABNORMAL HIGH (ref 0.0–2.0)
Acid-Base Excess: 3 mmol/L — ABNORMAL HIGH (ref 0.0–2.0)
Acid-base deficit: 1 mmol/L (ref 0.0–2.0)
Bicarbonate: 30.8 mmol/L — ABNORMAL HIGH (ref 20.0–28.0)
Bicarbonate: 30.2 mmol/L — ABNORMAL HIGH (ref 20.0–28.0)
Bicarbonate: 29 mmol/L — ABNORMAL HIGH (ref 20.0–28.0)
Bicarbonate: 29.4 mmol/L — ABNORMAL HIGH (ref 20.0–28.0)
Bicarbonate: 26.1 mmol/L (ref 20.0–28.0)
Calcium, Ion: 1.23 mmol/L (ref 1.15–1.40)
Calcium, Ion: 1.02 mmol/L — ABNORMAL LOW (ref 1.15–1.40)
Calcium, Ion: 1.08 mmol/L — ABNORMAL LOW (ref 1.15–1.40)
Calcium, Ion: 1.08 mmol/L — ABNORMAL LOW (ref 1.15–1.40)
Calcium, Ion: 1.14 mmol/L — ABNORMAL LOW (ref 1.15–1.40)
HCT: 41 % (ref 39.0–52.0)
HCT: 32 % — ABNORMAL LOW (ref 39.0–52.0)
HCT: 33 % — ABNORMAL LOW (ref 39.0–52.0)
HCT: 33 % — ABNORMAL LOW (ref 39.0–52.0)
HCT: 35 % — ABNORMAL LOW (ref 39.0–52.0)
Hemoglobin: 13.9 g/dL (ref 13.0–17.0)
Hemoglobin: 10.9 g/dL — ABNORMAL LOW (ref 13.0–17.0)
Hemoglobin: 11.2 g/dL — ABNORMAL LOW (ref 13.0–17.0)
Hemoglobin: 11.2 g/dL — ABNORMAL LOW (ref 13.0–17.0)
Hemoglobin: 11.9 g/dL — ABNORMAL LOW (ref 13.0–17.0)
O2 Saturation: 100 %
O2 Saturation: 100 %
O2 Saturation: 100 %
O2 Saturation: 100 %
O2 Saturation: 95 %
Patient temperature: 35.8
Potassium: 3.8 mmol/L (ref 3.5–5.1)
Potassium: 3.9 mmol/L (ref 3.5–5.1)
Potassium: 4.9 mmol/L (ref 3.5–5.1)
Potassium: 4.6 mmol/L (ref 3.5–5.1)
Potassium: 4.1 mmol/L (ref 3.5–5.1)
Sodium: 138 mmol/L (ref 135–145)
Sodium: 139 mmol/L (ref 135–145)
Sodium: 136 mmol/L (ref 135–145)
Sodium: 138 mmol/L (ref 135–145)
Sodium: 140 mmol/L (ref 135–145)
TCO2: 32 mmol/L (ref 22–32)
TCO2: 32 mmol/L (ref 22–32)
TCO2: 30 mmol/L (ref 22–32)
TCO2: 31 mmol/L (ref 22–32)
TCO2: 28 mmol/L (ref 22–32)
pCO2 arterial: 54.6 mmHg — ABNORMAL HIGH (ref 32.0–48.0)
pCO2 arterial: 55.2 mmHg — ABNORMAL HIGH (ref 32.0–48.0)
pCO2 arterial: 39.4 mmHg (ref 32.0–48.0)
pCO2 arterial: 53.6 mmHg — ABNORMAL HIGH (ref 32.0–48.0)
pCO2 arterial: 48.1 mmHg — ABNORMAL HIGH (ref 32.0–48.0)
pH, Arterial: 7.36 (ref 7.350–7.450)
pH, Arterial: 7.347 — ABNORMAL LOW (ref 7.350–7.450)
pH, Arterial: 7.475 — ABNORMAL HIGH (ref 7.350–7.450)
pH, Arterial: 7.348 — ABNORMAL LOW (ref 7.350–7.450)
pH, Arterial: 7.336 — ABNORMAL LOW (ref 7.350–7.450)
pO2, Arterial: 400 mmHg — ABNORMAL HIGH (ref 83.0–108.0)
pO2, Arterial: 312 mmHg — ABNORMAL HIGH (ref 83.0–108.0)
pO2, Arterial: 247 mmHg — ABNORMAL HIGH (ref 83.0–108.0)
pO2, Arterial: 258 mmHg — ABNORMAL HIGH (ref 83.0–108.0)
pO2, Arterial: 79 mmHg — ABNORMAL LOW (ref 83.0–108.0)

## 2020-02-17 LAB — CBC
HCT: 32.9 % — ABNORMAL LOW (ref 39.0–52.0)
HCT: 33.6 % — ABNORMAL LOW (ref 39.0–52.0)
HCT: 40.3 % (ref 39.0–52.0)
Hemoglobin: 11.1 g/dL — ABNORMAL LOW (ref 13.0–17.0)
Hemoglobin: 11.4 g/dL — ABNORMAL LOW (ref 13.0–17.0)
Hemoglobin: 13.9 g/dL (ref 13.0–17.0)
MCH: 33.8 pg (ref 26.0–34.0)
MCH: 34.1 pg — ABNORMAL HIGH (ref 26.0–34.0)
MCH: 34.2 pg — ABNORMAL HIGH (ref 26.0–34.0)
MCHC: 33.7 g/dL (ref 30.0–36.0)
MCHC: 33.9 g/dL (ref 30.0–36.0)
MCHC: 34.5 g/dL (ref 30.0–36.0)
MCV: 100.3 fL — ABNORMAL HIGH (ref 80.0–100.0)
MCV: 100.9 fL — ABNORMAL HIGH (ref 80.0–100.0)
MCV: 98.8 fL (ref 80.0–100.0)
Platelets: 133 10*3/uL — ABNORMAL LOW (ref 150–400)
Platelets: 82 10*3/uL — ABNORMAL LOW (ref 150–400)
Platelets: 86 10*3/uL — ABNORMAL LOW (ref 150–400)
RBC: 3.28 MIL/uL — ABNORMAL LOW (ref 4.22–5.81)
RBC: 3.33 MIL/uL — ABNORMAL LOW (ref 4.22–5.81)
RBC: 4.08 MIL/uL — ABNORMAL LOW (ref 4.22–5.81)
RDW: 12.9 % (ref 11.5–15.5)
RDW: 12.9 % (ref 11.5–15.5)
RDW: 12.9 % (ref 11.5–15.5)
WBC: 6.8 10*3/uL (ref 4.0–10.5)
WBC: 8.6 10*3/uL (ref 4.0–10.5)
WBC: 8.8 10*3/uL (ref 4.0–10.5)
nRBC: 0 % (ref 0.0–0.2)
nRBC: 0 % (ref 0.0–0.2)
nRBC: 0 % (ref 0.0–0.2)

## 2020-02-17 LAB — BASIC METABOLIC PANEL
Anion gap: 5 (ref 5–15)
Anion gap: 8 (ref 5–15)
BUN: 17 mg/dL (ref 8–23)
BUN: 11 mg/dL (ref 8–23)
CO2: 26 mmol/L (ref 22–32)
CO2: 22 mmol/L (ref 22–32)
Calcium: 9 mg/dL (ref 8.9–10.3)
Calcium: 7.2 mg/dL — ABNORMAL LOW (ref 8.9–10.3)
Chloride: 104 mmol/L (ref 98–111)
Chloride: 108 mmol/L (ref 98–111)
Creatinine, Ser: 0.78 mg/dL (ref 0.61–1.24)
Creatinine, Ser: 0.64 mg/dL (ref 0.61–1.24)
GFR calc Af Amer: >60 mL/min (ref >60)
GFR calc Af Amer: >60 mL/min (ref >60)
GFR calc non Af Amer: >60 mL/min (ref >60)
GFR calc non Af Amer: >60 mL/min (ref >60)
Glucose, Bld: 135 mg/dL — ABNORMAL HIGH (ref 70–99)
Glucose, Bld: 111 mg/dL — ABNORMAL HIGH (ref 70–99)
Potassium: 4 mmol/L (ref 3.5–5.1)
Potassium: 4.1 mmol/L (ref 3.5–5.1)
Sodium: 135 mmol/L (ref 135–145)
Sodium: 138 mmol/L (ref 135–145)

## 2020-02-17 LAB — AMYLASE: Amylase: 39 U/L (ref 28–100)

## 2020-02-17 LAB — TSH: TSH: 1.563 u[IU]/mL (ref 0.350–4.500)

## 2020-02-17 LAB — APTT: aPTT: 36 seconds (ref 24–36)

## 2020-02-17 LAB — ECHO INTRAOPERATIVE TEE
Height: 71 in
Weight: 2960 oz

## 2020-02-17 LAB — GLUCOSE, CAPILLARY
Glucose-Capillary: 132 mg/dL — ABNORMAL HIGH (ref 70–99)
Glucose-Capillary: 122 mg/dL — ABNORMAL HIGH (ref 70–99)
Glucose-Capillary: 117 mg/dL — ABNORMAL HIGH (ref 70–99)
Glucose-Capillary: 114 mg/dL — ABNORMAL HIGH (ref 70–99)
Glucose-Capillary: 120 mg/dL — ABNORMAL HIGH (ref 70–99)

## 2020-02-17 LAB — HEMOGLOBIN AND HEMATOCRIT, BLOOD
HCT: 30.8 % — ABNORMAL LOW (ref 39.0–52.0)
Hemoglobin: 10.5 g/dL — ABNORMAL LOW (ref 13.0–17.0)

## 2020-02-17 LAB — HEPARIN LEVEL (UNFRACTIONATED): Heparin Unfractionated: <0.1 IU/mL — ABNORMAL LOW (ref 0.30–0.70)

## 2020-02-17 LAB — PROTIME-INR
INR: 1.3 — ABNORMAL HIGH (ref 0.8–1.2)
Prothrombin Time: 15.3 seconds — ABNORMAL HIGH (ref 11.4–15.2)

## 2020-02-17 LAB — MAGNESIUM: Magnesium: 2.8 mg/dL — ABNORMAL HIGH (ref 1.7–2.4)

## 2020-02-17 LAB — SURGICAL PCR SCREEN
MRSA, PCR: NEGATIVE
Staphylococcus aureus: NEGATIVE

## 2020-02-17 LAB — HIV ANTIBODY (ROUTINE TESTING W REFLEX): HIV Screen 4th Generation wRfx: NONREACTIVE

## 2020-02-17 LAB — PLATELET COUNT: Platelets: 105 10*3/uL — ABNORMAL LOW (ref 150–400)

## 2020-02-17 SURGERY — CORONARY ARTERY BYPASS GRAFTING (CABG)
Anesthesia: General | Site: Chest

## 2020-02-17 MED ORDER — PROPOFOL 10 MG/ML IV BOLUS
INTRAVENOUS | Status: AC
  Filled 2020-02-17: qty 20

## 2020-02-17 MED ORDER — MIDAZOLAM HCL 2 MG/2ML IJ SOLN
INTRAMUSCULAR | Status: AC
  Filled 2020-02-17: qty 2

## 2020-02-17 MED ORDER — SODIUM CHLORIDE 0.9% FLUSH
10.0000 mL | Freq: Two times a day (BID) | INTRAVENOUS | Status: DC
  Administered 2020-02-17: 10 mL
  Administered 2020-02-19: 20 mL

## 2020-02-17 MED ORDER — 0.9 % SODIUM CHLORIDE (POUR BTL) OPTIME
TOPICAL | Status: DC | PRN
  Administered 2020-02-17: 6000 mL

## 2020-02-17 MED ORDER — ACETAMINOPHEN 650 MG RE SUPP
650.0000 mg | Freq: Once | RECTAL | Status: AC
  Administered 2020-02-17: 650 mg via RECTAL

## 2020-02-17 MED ORDER — ACETAMINOPHEN 160 MG/5ML PO SOLN
1000.0000 mg | Freq: Four times a day (QID) | ORAL | Status: DC
  Filled 2020-02-17: qty 40.6

## 2020-02-17 MED ORDER — MAGNESIUM SULFATE 4 GM/100ML IV SOLN
INTRAVENOUS | Status: AC
  Administered 2020-02-17: 4 g via INTRAVENOUS
  Filled 2020-02-17: qty 100

## 2020-02-17 MED ORDER — ONDANSETRON HCL 4 MG/2ML IJ SOLN
4.0000 mg | Freq: Four times a day (QID) | INTRAMUSCULAR | Status: DC | PRN
  Filled 2020-02-17: qty 2

## 2020-02-17 MED ORDER — ARTIFICIAL TEARS OPHTHALMIC OINT
TOPICAL_OINTMENT | OPHTHALMIC | Status: AC
  Filled 2020-02-17: qty 7

## 2020-02-17 MED ORDER — LACTATED RINGERS IV SOLN
INTRAVENOUS | Status: DC | PRN
Start: 2020-02-17 — End: 2020-02-17

## 2020-02-17 MED ORDER — FENTANYL CITRATE (PF) 250 MCG/5ML IJ SOLN
INTRAMUSCULAR | Status: AC
  Filled 2020-02-17: qty 5

## 2020-02-17 MED ORDER — CHLORHEXIDINE GLUCONATE 0.12% ORAL RINSE (MEDLINE KIT)
15.0000 mL | Freq: Two times a day (BID) | OROMUCOSAL | Status: DC
  Administered 2020-02-17 – 2020-02-20 (×5): 15 mL via OROMUCOSAL

## 2020-02-17 MED ORDER — PROTAMINE SULFATE 10 MG/ML IV SOLN
INTRAVENOUS | Status: AC
  Filled 2020-02-17: qty 25

## 2020-02-17 MED ORDER — CHLORHEXIDINE GLUCONATE CLOTH 2 % EX PADS
6.0000 | MEDICATED_PAD | Freq: Every day | CUTANEOUS | Status: DC
  Administered 2020-02-17 – 2020-02-19 (×3): 6 via TOPICAL

## 2020-02-17 MED ORDER — BISACODYL 10 MG RE SUPP: 10.0000 mg | Freq: Every day | RECTAL | Status: DC

## 2020-02-17 MED ORDER — SODIUM CHLORIDE 0.9% FLUSH: 3.0000 mL | INTRAVENOUS | Status: DC | PRN

## 2020-02-17 MED ORDER — PANTOPRAZOLE SODIUM 40 MG PO TBEC
40.0000 mg | DELAYED_RELEASE_TABLET | Freq: Every day | ORAL | Status: DC
  Administered 2020-02-19 – 2020-02-21 (×3): 40 mg via ORAL
  Filled 2020-02-17 (×3): qty 1

## 2020-02-17 MED ORDER — CHLORHEXIDINE GLUCONATE 0.12 % MT SOLN
15.0000 mL | OROMUCOSAL | Status: AC
  Administered 2020-02-17: 15 mL via OROMUCOSAL

## 2020-02-17 MED ORDER — ACETAMINOPHEN 160 MG/5ML PO SOLN: 650.0000 mg | Freq: Once | ORAL | Status: AC

## 2020-02-17 MED ORDER — SODIUM CHLORIDE 0.9 % IV SOLN
1.5000 g | Freq: Two times a day (BID) | INTRAVENOUS | Status: AC
  Administered 2020-02-17 – 2020-02-19 (×4): 1.5 g via INTRAVENOUS
  Filled 2020-02-17 (×4): qty 1.5

## 2020-02-17 MED ORDER — SODIUM CHLORIDE (PF) 0.9 % IJ SOLN
INTRAMUSCULAR | Status: AC
  Filled 2020-02-17: qty 10

## 2020-02-17 MED ORDER — LACTATED RINGERS IV SOLN: INTRAVENOUS | Status: DC

## 2020-02-17 MED ORDER — MIDAZOLAM HCL 2 MG/2ML IJ SOLN
2.0000 mg | INTRAMUSCULAR | Status: DC | PRN
  Administered 2020-02-17: 2 mg via INTRAVENOUS
  Filled 2020-02-17: qty 2

## 2020-02-17 MED ORDER — MIDAZOLAM HCL 5 MG/5ML IJ SOLN
INTRAMUSCULAR | Status: DC | PRN
  Administered 2020-02-17: 2 mg via INTRAVENOUS
  Administered 2020-02-17: 3 mg via INTRAVENOUS
  Administered 2020-02-17 (×3): 2 mg via INTRAVENOUS
  Administered 2020-02-17: 1 mg via INTRAVENOUS
  Administered 2020-02-17: 2 mg via INTRAVENOUS

## 2020-02-17 MED ORDER — PLASMA-LYTE 148 IV SOLN
INTRAVENOUS | Status: DC | PRN
  Administered 2020-02-17: 500 mL via INTRAVASCULAR

## 2020-02-17 MED ORDER — FENTANYL CITRATE (PF) 250 MCG/5ML IJ SOLN
INTRAMUSCULAR | Status: DC | PRN
  Administered 2020-02-17 (×2): 50 ug via INTRAVENOUS
  Administered 2020-02-17: 200 ug via INTRAVENOUS
  Administered 2020-02-17: 50 ug via INTRAVENOUS
  Administered 2020-02-17 (×2): 100 ug via INTRAVENOUS
  Administered 2020-02-17: 150 ug via INTRAVENOUS
  Administered 2020-02-17 (×5): 100 ug via INTRAVENOUS
  Administered 2020-02-17: 50 ug via INTRAVENOUS

## 2020-02-17 MED ORDER — LEVALBUTEROL HCL 1.25 MG/0.5ML IN NEBU: 1.2500 mg | INHALATION_SOLUTION | Freq: Four times a day (QID) | RESPIRATORY_TRACT | Status: DC

## 2020-02-17 MED ORDER — TRAMADOL HCL 50 MG PO TABS
50.0000 mg | ORAL_TABLET | ORAL | Status: DC | PRN
  Administered 2020-02-18 – 2020-02-19 (×2): 100 mg via ORAL
  Filled 2020-02-17: qty 2
  Filled 2020-02-17 (×2): qty 1

## 2020-02-17 MED ORDER — MOMETASONE FURO-FORMOTEROL FUM 100-5 MCG/ACT IN AERO
2.0000 | INHALATION_SPRAY | Freq: Two times a day (BID) | RESPIRATORY_TRACT | Status: DC
  Administered 2020-02-18 – 2020-02-20 (×5): 2 via RESPIRATORY_TRACT
  Filled 2020-02-17: qty 8.8

## 2020-02-17 MED ORDER — FENTANYL CITRATE (PF) 250 MCG/5ML IJ SOLN
INTRAMUSCULAR | Status: AC
  Filled 2020-02-17: qty 10

## 2020-02-17 MED ORDER — ROCURONIUM BROMIDE 10 MG/ML (PF) SYRINGE
PREFILLED_SYRINGE | INTRAVENOUS | Status: AC
  Filled 2020-02-17: qty 10

## 2020-02-17 MED ORDER — ALBUMIN HUMAN 5 % IV SOLN
INTRAVENOUS | Status: DC | PRN
Start: 2020-02-17 — End: 2020-02-17

## 2020-02-17 MED ORDER — SODIUM CHLORIDE 0.9 % IV SOLN: INTRAVENOUS | Status: DC

## 2020-02-17 MED ORDER — LACTATED RINGERS IV SOLN: INTRAVENOUS | Status: DC | PRN

## 2020-02-17 MED ORDER — BISACODYL 5 MG PO TBEC
10.0000 mg | DELAYED_RELEASE_TABLET | Freq: Every day | ORAL | Status: DC
  Administered 2020-02-18 – 2020-02-20 (×3): 10 mg via ORAL
  Filled 2020-02-17 (×3): qty 2

## 2020-02-17 MED ORDER — FENTANYL CITRATE (PF) 100 MCG/2ML IJ SOLN
INTRAMUSCULAR | Status: AC
  Filled 2020-02-17: qty 2

## 2020-02-17 MED ORDER — SODIUM CHLORIDE 0.9% FLUSH: 3.0000 mL | Freq: Two times a day (BID) | INTRAVENOUS | Status: DC

## 2020-02-17 MED ORDER — PHENYLEPHRINE 40 MCG/ML (10ML) SYRINGE FOR IV PUSH (FOR BLOOD PRESSURE SUPPORT)
PREFILLED_SYRINGE | INTRAVENOUS | Status: DC | PRN
  Administered 2020-02-17: 120 ug via INTRAVENOUS
  Administered 2020-02-17: 80 ug via INTRAVENOUS
  Administered 2020-02-17: 120 ug via INTRAVENOUS
  Administered 2020-02-17: 80 ug via INTRAVENOUS
  Administered 2020-02-17: 120 ug via INTRAVENOUS

## 2020-02-17 MED ORDER — LACTATED RINGERS IV SOLN: 500.0000 mL | Freq: Once | INTRAVENOUS | Status: DC | PRN

## 2020-02-17 MED ORDER — SODIUM CHLORIDE (PF) 0.9 % IJ SOLN
OROMUCOSAL | Status: DC | PRN
  Administered 2020-02-17 (×3): 4 mL via TOPICAL

## 2020-02-17 MED ORDER — MAGNESIUM SULFATE 4 GM/100ML IV SOLN: 4.0000 g | Freq: Once | INTRAVENOUS | Status: AC

## 2020-02-17 MED ORDER — SODIUM CHLORIDE 0.9 % IV SOLN
20.0000 ug | Freq: Once | INTRAVENOUS | Status: AC
  Administered 2020-02-17: 20 ug via INTRAVENOUS
  Filled 2020-02-17: qty 5

## 2020-02-17 MED ORDER — HEPARIN SODIUM (PORCINE) 1000 UNIT/ML IJ SOLN
INTRAMUSCULAR | Status: DC | PRN
  Administered 2020-02-17: 28000 [IU] via INTRAVENOUS
  Administered 2020-02-17: 2000 [IU] via INTRAVENOUS

## 2020-02-17 MED ORDER — ACETAMINOPHEN 500 MG PO TABS
1000.0000 mg | ORAL_TABLET | Freq: Four times a day (QID) | ORAL | Status: DC
  Administered 2020-02-18 – 2020-02-21 (×14): 1000 mg via ORAL
  Filled 2020-02-17 (×14): qty 2

## 2020-02-17 MED ORDER — METOCLOPRAMIDE HCL 5 MG/ML IJ SOLN
10.0000 mg | Freq: Four times a day (QID) | INTRAMUSCULAR | Status: DC
  Administered 2020-02-17 – 2020-02-19 (×9): 10 mg via INTRAVENOUS
  Filled 2020-02-17 (×8): qty 2

## 2020-02-17 MED ORDER — DEXMEDETOMIDINE HCL IN NACL 400 MCG/100ML IV SOLN
0.0000 ug/kg/h | INTRAVENOUS | Status: DC
  Filled 2020-02-17: qty 100

## 2020-02-17 MED ORDER — VANCOMYCIN HCL IN DEXTROSE 1-5 GM/200ML-% IV SOLN
1000.0000 mg | Freq: Once | INTRAVENOUS | Status: AC
  Administered 2020-02-17: 1000 mg via INTRAVENOUS
  Filled 2020-02-17: qty 200

## 2020-02-17 MED ORDER — ARTIFICIAL TEARS OPHTHALMIC OINT
TOPICAL_OINTMENT | OPHTHALMIC | Status: DC | PRN
  Administered 2020-02-17: 1 via OPHTHALMIC

## 2020-02-17 MED ORDER — SODIUM CHLORIDE 0.9 % IV SOLN
INTRAVENOUS | Status: DC | PRN
Start: 2020-02-17 — End: 2020-02-17

## 2020-02-17 MED ORDER — FAMOTIDINE IN NACL 20-0.9 MG/50ML-% IV SOLN
INTRAVENOUS | Status: AC
  Filled 2020-02-17: qty 50

## 2020-02-17 MED ORDER — HEPARIN SODIUM (PORCINE) 1000 UNIT/ML IJ SOLN
INTRAMUSCULAR | Status: AC
  Filled 2020-02-17: qty 1

## 2020-02-17 MED ORDER — LIDOCAINE 2% (20 MG/ML) 5 ML SYRINGE
INTRAMUSCULAR | Status: AC
  Filled 2020-02-17: qty 5

## 2020-02-17 MED ORDER — CHLORHEXIDINE GLUCONATE 0.12 % MT SOLN
OROMUCOSAL | Status: AC
  Administered 2020-02-17: 15 mL via OROMUCOSAL
  Filled 2020-02-17: qty 15

## 2020-02-17 MED ORDER — METOPROLOL TARTRATE 25 MG/10 ML ORAL SUSPENSION
12.5000 mg | Freq: Two times a day (BID) | ORAL | Status: DC
  Filled 2020-02-17 (×4): qty 5

## 2020-02-17 MED ORDER — ASPIRIN EC 325 MG PO TBEC
325.0000 mg | DELAYED_RELEASE_TABLET | Freq: Every day | ORAL | Status: DC
  Administered 2020-02-18 – 2020-02-21 (×4): 325 mg via ORAL
  Filled 2020-02-17 (×4): qty 1

## 2020-02-17 MED ORDER — MILRINONE LACTATE IN DEXTROSE 20-5 MG/100ML-% IV SOLN
0.2500 ug/kg/min | INTRAVENOUS | Status: DC
  Filled 2020-02-17: qty 100

## 2020-02-17 MED ORDER — INSULIN REGULAR(HUMAN) IN NACL 100-0.9 UT/100ML-% IV SOLN: INTRAVENOUS | Status: DC

## 2020-02-17 MED ORDER — PHENYLEPHRINE 40 MCG/ML (10ML) SYRINGE FOR IV PUSH (FOR BLOOD PRESSURE SUPPORT)
PREFILLED_SYRINGE | INTRAVENOUS | Status: AC
  Filled 2020-02-17: qty 10

## 2020-02-17 MED ORDER — SODIUM CHLORIDE 0.9 % IV SOLN
250.0000 mL | INTRAVENOUS | Status: DC
  Administered 2020-02-18: 250 mL via INTRAVENOUS

## 2020-02-17 MED ORDER — METOPROLOL TARTRATE 5 MG/5ML IV SOLN: 2.5000 mg | INTRAVENOUS | Status: DC | PRN

## 2020-02-17 MED ORDER — METOPROLOL TARTRATE 12.5 MG HALF TABLET
12.5000 mg | ORAL_TABLET | Freq: Two times a day (BID) | ORAL | Status: DC
  Administered 2020-02-18 – 2020-02-21 (×5): 12.5 mg via ORAL
  Filled 2020-02-17 (×6): qty 1

## 2020-02-17 MED ORDER — PHENYLEPHRINE HCL-NACL 20-0.9 MG/250ML-% IV SOLN
0.0000 ug/min | INTRAVENOUS | Status: DC
  Filled 2020-02-17 (×3): qty 250

## 2020-02-17 MED ORDER — ROCURONIUM BROMIDE 10 MG/ML (PF) SYRINGE
PREFILLED_SYRINGE | INTRAVENOUS | Status: DC | PRN
  Administered 2020-02-17: 50 mg via INTRAVENOUS
  Administered 2020-02-17 (×2): 40 mg via INTRAVENOUS
  Administered 2020-02-17: 60 mg via INTRAVENOUS
  Administered 2020-02-17: 40 mg via INTRAVENOUS
  Administered 2020-02-17: 50 mg via INTRAVENOUS

## 2020-02-17 MED ORDER — SODIUM CHLORIDE 0.45 % IV SOLN: INTRAVENOUS | Status: DC | PRN

## 2020-02-17 MED ORDER — PROTAMINE SULFATE 10 MG/ML IV SOLN
INTRAVENOUS | Status: AC
  Filled 2020-02-17: qty 5

## 2020-02-17 MED ORDER — OXYCODONE HCL 5 MG PO TABS
5.0000 mg | ORAL_TABLET | ORAL | Status: DC | PRN
  Administered 2020-02-18 – 2020-02-19 (×8): 10 mg via ORAL
  Administered 2020-02-20: 5 mg via ORAL
  Administered 2020-02-20: 10 mg via ORAL
  Administered 2020-02-20: 5 mg via ORAL
  Administered 2020-02-21 (×2): 10 mg via ORAL
  Filled 2020-02-17 (×5): qty 2
  Filled 2020-02-17: qty 1
  Filled 2020-02-17 (×6): qty 2
  Filled 2020-02-17: qty 1

## 2020-02-17 MED ORDER — DOCUSATE SODIUM 100 MG PO CAPS
200.0000 mg | ORAL_CAPSULE | Freq: Every day | ORAL | Status: DC
  Administered 2020-02-18 – 2020-02-21 (×4): 200 mg via ORAL
  Filled 2020-02-17 (×4): qty 2

## 2020-02-17 MED ORDER — ORAL CARE MOUTH RINSE
15.0000 mL | OROMUCOSAL | Status: DC
  Administered 2020-02-17 – 2020-02-18 (×7): 15 mL via OROMUCOSAL

## 2020-02-17 MED ORDER — NITROGLYCERIN IN D5W 200-5 MCG/ML-% IV SOLN: 0.0000 ug/min | INTRAVENOUS | Status: DC

## 2020-02-17 MED ORDER — FAMOTIDINE IN NACL 20-0.9 MG/50ML-% IV SOLN
20.0000 mg | Freq: Two times a day (BID) | INTRAVENOUS | Status: AC
  Administered 2020-02-17 – 2020-02-18 (×2): 20 mg via INTRAVENOUS
  Filled 2020-02-17 (×2): qty 50

## 2020-02-17 MED ORDER — MORPHINE SULFATE (PF) 2 MG/ML IV SOLN
1.0000 mg | INTRAVENOUS | Status: DC | PRN
  Administered 2020-02-17 – 2020-02-19 (×7): 2 mg via INTRAVENOUS
  Administered 2020-02-19: 4 mg via INTRAVENOUS
  Filled 2020-02-17 (×7): qty 1
  Filled 2020-02-17: qty 2

## 2020-02-17 MED ORDER — LIDOCAINE 2% (20 MG/ML) 5 ML SYRINGE
INTRAMUSCULAR | Status: DC | PRN
  Administered 2020-02-17: 40 mg via INTRAVENOUS

## 2020-02-17 MED ORDER — SODIUM CHLORIDE 0.9% FLUSH: 10.0000 mL | INTRAVENOUS | Status: DC | PRN

## 2020-02-17 MED ORDER — ATORVASTATIN CALCIUM 80 MG PO TABS
80.0000 mg | ORAL_TABLET | Freq: Every day | ORAL | Status: DC
  Administered 2020-02-18 – 2020-02-21 (×4): 80 mg via ORAL
  Filled 2020-02-17 (×4): qty 1

## 2020-02-17 MED ORDER — LEVALBUTEROL HCL 1.25 MG/0.5ML IN NEBU
1.2500 mg | INHALATION_SOLUTION | Freq: Four times a day (QID) | RESPIRATORY_TRACT | Status: DC
  Administered 2020-02-17 – 2020-02-18 (×5): 1.25 mg via RESPIRATORY_TRACT
  Filled 2020-02-17 (×5): qty 0.5

## 2020-02-17 MED ORDER — PROTAMINE SULFATE 10 MG/ML IV SOLN
INTRAVENOUS | Status: DC | PRN
  Administered 2020-02-17: 40 mg via INTRAVENOUS
  Administered 2020-02-17 (×2): 50 mg via INTRAVENOUS
  Administered 2020-02-17: 30 mg via INTRAVENOUS
  Administered 2020-02-17 (×2): 50 mg via INTRAVENOUS
  Administered 2020-02-17: 30 mg via INTRAVENOUS

## 2020-02-17 MED ORDER — HEMOSTATIC AGENTS (NO CHARGE) OPTIME
TOPICAL | Status: DC | PRN
  Administered 2020-02-17 (×2): 1 via TOPICAL

## 2020-02-17 MED ORDER — ALBUMIN HUMAN 5 % IV SOLN
250.0000 mL | INTRAVENOUS | Status: AC | PRN
  Administered 2020-02-17 (×2): 12.5 g via INTRAVENOUS

## 2020-02-17 MED ORDER — MILRINONE LACTATE IN DEXTROSE 20-5 MG/100ML-% IV SOLN
INTRAVENOUS | Status: DC | PRN
Start: 2020-02-17 — End: 2020-02-17
  Administered 2020-02-17: .25 ug/kg/min via INTRAVENOUS

## 2020-02-17 MED ORDER — MIDAZOLAM HCL (PF) 10 MG/2ML IJ SOLN
INTRAMUSCULAR | Status: AC
  Filled 2020-02-17: qty 2

## 2020-02-17 MED ORDER — GUAIFENESIN ER 600 MG PO TB12
600.0000 mg | ORAL_TABLET | Freq: Two times a day (BID) | ORAL | Status: DC
  Administered 2020-02-18 – 2020-02-21 (×7): 600 mg via ORAL
  Filled 2020-02-17 (×7): qty 1

## 2020-02-17 MED ORDER — POTASSIUM CHLORIDE 10 MEQ/50ML IV SOLN: 10.0000 meq | INTRAVENOUS | Status: AC

## 2020-02-17 MED ORDER — PROPOFOL 10 MG/ML IV BOLUS
INTRAVENOUS | Status: DC | PRN
  Administered 2020-02-17: 50 mg via INTRAVENOUS
  Administered 2020-02-17: 100 mg via INTRAVENOUS

## 2020-02-17 MED ORDER — ASPIRIN 81 MG PO CHEW: 324.0000 mg | CHEWABLE_TABLET | Freq: Every day | ORAL | Status: DC

## 2020-02-17 MED ORDER — DEXTROSE 50 % IV SOLN: 0.0000 mL | INTRAVENOUS | Status: DC | PRN

## 2020-02-17 SURGICAL SUPPLY — 114 items
ADAPTER CARDIO PERF ANTE/RETRO (ADAPTER) ×4 IMPLANT
BAG DECANTER FOR FLEXI CONT (MISCELLANEOUS) ×4 IMPLANT
BLADE CLIPPER SURG (BLADE) ×4 IMPLANT
BLADE MINI RND TIP GREEN BEAV (BLADE) ×4 IMPLANT
BLADE STERNUM SYSTEM 6 (BLADE) ×4 IMPLANT
BLADE SURG 11 STRL SS (BLADE) ×4 IMPLANT
BLADE SURG 12 STRL SS (BLADE) ×4 IMPLANT
BNDG ELASTIC 4X5.8 VLCR STR LF (GAUZE/BANDAGES/DRESSINGS) ×4 IMPLANT
BNDG ELASTIC 6X10 VLCR STRL LF (GAUZE/BANDAGES/DRESSINGS) ×4 IMPLANT
BNDG ELASTIC 6X5.8 VLCR STR LF (GAUZE/BANDAGES/DRESSINGS) ×4 IMPLANT
BNDG GAUZE ELAST 4 BULKY (GAUZE/BANDAGES/DRESSINGS) ×4 IMPLANT
CANISTER SUCT 3000ML PPV (MISCELLANEOUS) ×4 IMPLANT
CANNULA GUNDRY RCSP 15FR (MISCELLANEOUS) ×4 IMPLANT
CATH CPB KIT VANTRIGT (MISCELLANEOUS) ×4 IMPLANT
CATH ROBINSON RED A/P 18FR (CATHETERS) ×12 IMPLANT
CATH THORACIC 28FR RT ANG (CATHETERS) ×4 IMPLANT
CLIP FOGARTY SPRING 6M (CLIP) ×4 IMPLANT
CLIP LIGATING EXTRA MED SLVR (CLIP) ×4 IMPLANT
CLIP VESOCCLUDE SM WIDE 24/CT (CLIP) ×8 IMPLANT
DERMABOND ADHESIVE PROPEN (GAUZE/BANDAGES/DRESSINGS) ×2
DERMABOND ADVANCED .7 DNX6 (GAUZE/BANDAGES/DRESSINGS) ×2 IMPLANT
DRAIN CHANNEL 32F RND 10.7 FF (WOUND CARE) ×4 IMPLANT
DRAPE CARDIOVASCULAR INCISE (DRAPES) ×2
DRAPE SLUSH/WARMER DISC (DRAPES) ×4 IMPLANT
DRAPE SRG 135X102X78XABS (DRAPES) ×2 IMPLANT
DRSG AQUACEL AG ADV 3.5X14 (GAUZE/BANDAGES/DRESSINGS) ×4 IMPLANT
ELECT BLADE 4.0 EZ CLEAN MEGAD (MISCELLANEOUS) ×4
ELECT BLADE 6.5 EXT (BLADE) ×4 IMPLANT
ELECT CAUTERY BLADE 6.4 (BLADE) ×4 IMPLANT
ELECT REM PT RETURN 9FT ADLT (ELECTROSURGICAL) ×8
ELECTRODE BLDE 4.0 EZ CLN MEGD (MISCELLANEOUS) ×2 IMPLANT
ELECTRODE REM PT RTRN 9FT ADLT (ELECTROSURGICAL) ×4 IMPLANT
FELT TEFLON 1X6 (MISCELLANEOUS) ×8 IMPLANT
GAUZE SPONGE 4X4 12PLY STRL (GAUZE/BANDAGES/DRESSINGS) ×8 IMPLANT
GLOVE BIO SURGEON STRL SZ 6 (GLOVE) ×12 IMPLANT
GLOVE BIO SURGEON STRL SZ 6.5 (GLOVE) ×3 IMPLANT
GLOVE BIO SURGEON STRL SZ7.5 (GLOVE) ×12 IMPLANT
GLOVE BIO SURGEON STRL SZ8.5 (GLOVE) ×4 IMPLANT
GLOVE BIO SURGEONS STRL SZ 6.5 (GLOVE) ×1
GLOVE BIOGEL PI IND STRL 7.0 (GLOVE) ×2 IMPLANT
GLOVE BIOGEL PI IND STRL 8 (GLOVE) ×2 IMPLANT
GLOVE BIOGEL PI IND STRL 9 (GLOVE) ×2 IMPLANT
GLOVE BIOGEL PI INDICATOR 7.0 (GLOVE) ×2
GLOVE BIOGEL PI INDICATOR 8 (GLOVE) ×2
GLOVE BIOGEL PI INDICATOR 9 (GLOVE) ×2
GLOVE SURG SS PI 6.0 STRL IVOR (GLOVE) ×8 IMPLANT
GOWN STRL REUS W/ TWL LRG LVL3 (GOWN DISPOSABLE) ×18 IMPLANT
GOWN STRL REUS W/TWL LRG LVL3 (GOWN DISPOSABLE) ×18
HEMOSTAT POWDER SURGIFOAM 1G (HEMOSTASIS) ×12 IMPLANT
HEMOSTAT SURGICEL 2X14 (HEMOSTASIS) ×4 IMPLANT
INSERT FOGARTY XLG (MISCELLANEOUS) IMPLANT
KIT BASIN OR (CUSTOM PROCEDURE TRAY) ×4 IMPLANT
KIT SUCTION CATH 14FR (SUCTIONS) ×4 IMPLANT
KIT TURNOVER KIT B (KITS) ×4 IMPLANT
KIT VASOVIEW HEMOPRO 2 VH 4000 (KITS) ×4 IMPLANT
LEAD PACING MYOCARDI (MISCELLANEOUS) ×4 IMPLANT
MARKER GRAFT CORONARY BYPASS (MISCELLANEOUS) ×16 IMPLANT
NS IRRIG 1000ML POUR BTL (IV SOLUTION) ×24 IMPLANT
PACK E OPEN HEART (SUTURE) ×4 IMPLANT
PACK OPEN HEART (CUSTOM PROCEDURE TRAY) ×4 IMPLANT
PAD ARMBOARD 7.5X6 YLW CONV (MISCELLANEOUS) ×8 IMPLANT
PAD ELECT DEFIB RADIOL ZOLL (MISCELLANEOUS) ×4 IMPLANT
PENCIL BUTTON HOLSTER BLD 10FT (ELECTRODE) ×4 IMPLANT
POSITIONER HEAD DONUT 9IN (MISCELLANEOUS) ×4 IMPLANT
PUNCH AORTIC ROTATE 4.0MM (MISCELLANEOUS) IMPLANT
PUNCH AORTIC ROTATE 4.5MM 8IN (MISCELLANEOUS) ×4 IMPLANT
PUNCH AORTIC ROTATE 5MM 8IN (MISCELLANEOUS) IMPLANT
SET CARDIOPLEGIA MPS 5001102 (MISCELLANEOUS) ×4 IMPLANT
SOL ANTI FOG 6CC (MISCELLANEOUS) ×2 IMPLANT
SOLUTION ANTI FOG 6CC (MISCELLANEOUS) ×2
SPONGE LAP 18X18 RF (DISPOSABLE) ×8 IMPLANT
STOPCOCK 3 WAY HIGH PRESSURE (MISCELLANEOUS) ×2
STOPCOCK 3WAY HIGH PRESSURE (MISCELLANEOUS) ×2 IMPLANT
SUPPORT HEART JANKE-BARRON (MISCELLANEOUS) ×4 IMPLANT
SURGIFLO W/THROMBIN 8M KIT (HEMOSTASIS) ×4 IMPLANT
SUT BONE WAX W31G (SUTURE) ×4 IMPLANT
SUT MNCRL AB 4-0 PS2 18 (SUTURE) ×4 IMPLANT
SUT PROLENE 3 0 SH DA (SUTURE) IMPLANT
SUT PROLENE 3 0 SH1 36 (SUTURE) IMPLANT
SUT PROLENE 4 0 RB 1 (SUTURE) ×2
SUT PROLENE 4 0 SH DA (SUTURE) ×4 IMPLANT
SUT PROLENE 4-0 RB1 .5 CRCL 36 (SUTURE) ×2 IMPLANT
SUT PROLENE 5 0 C 1 36 (SUTURE) IMPLANT
SUT PROLENE 6 0 C 1 30 (SUTURE) ×8 IMPLANT
SUT PROLENE 6 0 CC (SUTURE) ×16 IMPLANT
SUT PROLENE 8 0 BV175 6 (SUTURE) ×16 IMPLANT
SUT PROLENE BLUE 7 0 (SUTURE) ×8 IMPLANT
SUT PROLENE POLY MONO (SUTURE) ×4 IMPLANT
SUT SILK  1 MH (SUTURE)
SUT SILK 1 MH (SUTURE) IMPLANT
SUT SILK 2 0 SH CR/8 (SUTURE) IMPLANT
SUT SILK 2 0SH CR/8 30 (SUTURE) ×4 IMPLANT
SUT SILK 3 0 SH CR/8 (SUTURE) IMPLANT
SUT STEEL 6MS V (SUTURE) ×4 IMPLANT
SUT STEEL SZ 6 DBL 3X14 BALL (SUTURE) ×8 IMPLANT
SUT VIC AB 1 CTX 18 (SUTURE) ×4 IMPLANT
SUT VIC AB 1 CTX 36 (SUTURE) ×6
SUT VIC AB 1 CTX36XBRD ANBCTR (SUTURE) ×6 IMPLANT
SUT VIC AB 2-0 CT1 27 (SUTURE) ×2
SUT VIC AB 2-0 CT1 TAPERPNT 27 (SUTURE) ×2 IMPLANT
SUT VIC AB 2-0 CTX 27 (SUTURE) IMPLANT
SUT VIC AB 3-0 X1 27 (SUTURE) IMPLANT
SYSTEM SAHARA CHEST DRAIN ATS (WOUND CARE) ×4 IMPLANT
TAPE CLOTH SURG 4X10 WHT LF (GAUZE/BANDAGES/DRESSINGS) ×4 IMPLANT
TAPE PAPER 2X10 WHT MICROPORE (GAUZE/BANDAGES/DRESSINGS) ×4 IMPLANT
TOWEL GREEN STERILE (TOWEL DISPOSABLE) ×4 IMPLANT
TOWEL GREEN STERILE FF (TOWEL DISPOSABLE) ×4 IMPLANT
TRAY CATH LUMEN 1 20CM STRL (SET/KITS/TRAYS/PACK) ×4 IMPLANT
TRAY FOLEY SLVR 16FR TEMP STAT (SET/KITS/TRAYS/PACK) ×4 IMPLANT
TUBING ART PRESS 48 MALE/FEM (TUBING) ×8 IMPLANT
TUBING LAP HI FLOW INSUFFLATIO (TUBING) ×4 IMPLANT
UNDERPAD 30X36 HEAVY ABSORB (UNDERPADS AND DIAPERS) ×4 IMPLANT
WATER STERILE IRR 1000ML POUR (IV SOLUTION) ×8 IMPLANT
YANKAUER SUCT BULB TIP NO VENT (SUCTIONS) ×4 IMPLANT

## 2020-02-17 NOTE — Progress Notes (Signed)
Patient's dentures and glasses removed and both given to significant other, Dan Europe.

## 2020-02-17 NOTE — Anesthesia Procedure Notes (Signed)
Arterial Line Insertion Start/End6/07/2020 9:15 AM Performed by: Dorie Rank, CRNA  Preanesthetic checklist: patient identified, IV checked, site marked, risks and benefits discussed, surgical consent, monitors and equipment checked, pre-op evaluation, timeout performed and anesthesia consent Lidocaine 1% used for infiltration Left, radial was placed Catheter size: 20 G Hand hygiene performed  and maximum sterile barriers used  Allen's test indicative of satisfactory collateral circulation Attempts: 2 Procedure performed without using ultrasound guided technique. Following insertion, dressing applied. Patient tolerated the procedure well with no immediate complications.

## 2020-02-17 NOTE — Anesthesia Procedure Notes (Signed)
Arterial Line Insertion Start/End6/07/2020 4:36 PM Performed by: Shireen Quan, CRNA, CRNA  Patient location: OR. Preanesthetic checklist: patient identified, IV checked, site marked, risks and benefits discussed, surgical consent, monitors and equipment checked, pre-op evaluation, timeout performed and anesthesia consent Right, radial was placed Catheter size: 20 G Hand hygiene performed  and maximum sterile barriers used   Attempts: 3 Procedure performed using ultrasound guided technique. Ultrasound Notes:anatomy identified, needle tip was noted to be adjacent to the nerve/plexus identified and no ultrasound evidence of intravascular and/or intraneural injection Following insertion, dressing applied and Biopatch. Post procedure assessment: normal and unchanged  Patient tolerated the procedure well with no immediate complications.

## 2020-02-17 NOTE — Anesthesia Procedure Notes (Signed)
Procedure Name: Intubation Date/Time: 02/17/2020 10:13 AM Performed by: Kipp Brood, MD Pre-anesthesia Checklist: Patient identified, Emergency Drugs available, Suction available and Patient being monitored Patient Re-evaluated:Patient Re-evaluated prior to induction Oxygen Delivery Method: Circle system utilized Preoxygenation: Pre-oxygenation with 100% oxygen Induction Type: IV induction Ventilation: Mask ventilation without difficulty Laryngoscope Size: Miller and 2 Grade View: Grade I Tube type: Oral Tube size: 8.0 mm Number of attempts: 1 Airway Equipment and Method: Stylet and Oral airway Placement Confirmation: ETT inserted through vocal cords under direct vision,  positive ETCO2 and breath sounds checked- equal and bilateral Secured at: 22 cm Tube secured with: Tape Dental Injury: Teeth and Oropharynx as per pre-operative assessment

## 2020-02-17 NOTE — Anesthesia Procedure Notes (Signed)
Central Venous Catheter Insertion Performed by: Kipp Brood, MD, anesthesiologist Start/End6/07/2020 9:40 AM, 02/17/2020 9:50 AM Patient location: Pre-op. Preanesthetic checklist: patient identified, IV checked, site marked, risks and benefits discussed, surgical consent, monitors and equipment checked, pre-op evaluation, timeout performed and anesthesia consent Lidocaine 1% used for infiltration and patient sedated Hand hygiene performed  and maximum sterile barriers used  Catheter size: 8.5 Fr Sheath introducer Procedure performed using ultrasound guided technique. Ultrasound Notes:anatomy identified, needle tip was noted to be adjacent to the nerve/plexus identified, no ultrasound evidence of intravascular and/or intraneural injection and image(s) printed for medical record Attempts: 1 Following insertion, line sutured and dressing applied. Post procedure assessment: blood return through all ports, free fluid flow and no air  Patient tolerated the procedure well with no immediate complications.

## 2020-02-17 NOTE — Transfer of Care (Signed)
Immediate Anesthesia Transfer of Care Note  Patient: Karl Garcia  Procedure(s) Performed: CORONARY ARTERY BYPASS GRAFTING (CABG) x Three, using left internal mammry artery and right leg greater saphenous vein harvested endoscopically (N/A Chest) TRANSESOPHAGEAL ECHOCARDIOGRAM (TEE) (N/A )  Patient Location: ICU  Anesthesia Type:General  Level of Consciousness: unresponsive and Patient remains intubated per anesthesia plan  Airway & Oxygen Therapy: Patient remains intubated per anesthesia plan and Patient placed on Ventilator (see vital sign flow sheet for setting)  Post-op Assessment: Report given to RN and Post -op Vital signs reviewed and stable  Post vital signs: Reviewed and stable  Last Vitals:  Vitals Value Taken Time  BP 113/60 02/17/20 1656  Temp    Pulse 80 02/17/20 1656  Resp 12 02/17/20 1657  SpO2 99 % 02/17/20 1657  Vitals shown include unvalidated device data.  Last Pain:  Vitals:   02/17/20 0911  TempSrc:   PainSc: 0-No pain         Complications: No complications documented.

## 2020-02-17 NOTE — Progress Notes (Signed)
ANTICOAGULATION CONSULT NOTE - Follow Up Consult  Pharmacy Consult for heparin Indication: CAD awaiting CABG  Labs: Recent Labs    02/16/20 0715 02/16/20 0940 02/17/20 0032  HGB 15.0  --  13.9  HCT 44.9  --  40.3  PLT 157  --  133*  HEPARINUNFRC  --   --  <0.10*  CREATININE 0.62  --   --   TROPONINIHS 89* 123*  --     Assessment: 59yo male subtherapeutic on heparin with initial dosing post-cath; no gtt issues or signs of bleeding per RN.  Goal of Therapy:  Heparin level 0.3-0.7 units/ml   Plan:  Will increase heparin gtt by 4 units/kg/hr to 1300 units/hr until off for OR in am.    Karl Garcia, PharmD, BCPS  02/17/2020,1:16 AM

## 2020-02-17 NOTE — Anesthesia Preprocedure Evaluation (Signed)
Anesthesia Evaluation  Patient identified by MRN, date of birth, ID band Patient awake    Reviewed: Allergy & Precautions, NPO status , Patient's Chart, lab work & pertinent test results  Airway Mallampati: II  TM Distance: >3 FB Neck ROM: Full    Dental  (+) Edentulous Upper, Edentulous Lower   Pulmonary Current Smoker and Patient abstained from smoking.,    breath sounds clear to auscultation       Cardiovascular  Rhythm:Regular Rate:Normal     Neuro/Psych    GI/Hepatic   Endo/Other    Renal/GU      Musculoskeletal   Abdominal   Peds  Hematology   Anesthesia Other Findings   Reproductive/Obstetrics                             Anesthesia Physical Anesthesia Plan  ASA: IV  Anesthesia Plan: General   Post-op Pain Management:    Induction: Intravenous  PONV Risk Score and Plan: Ondansetron and Dexamethasone  Airway Management Planned: Oral ETT  Additional Equipment: Arterial line, PA Cath, 3D TEE and Ultrasound Guidance Line Placement  Intra-op Plan:   Post-operative Plan: Post-operative intubation/ventilation  Informed Consent: I have reviewed the patients History and Physical, chart, labs and discussed the procedure including the risks, benefits and alternatives for the proposed anesthesia with the patient or authorized representative who has indicated his/her understanding and acceptance.       Plan Discussed with: CRNA and Anesthesiologist  Anesthesia Plan Comments:         Anesthesia Quick Evaluation

## 2020-02-17 NOTE — Op Note (Signed)
NAME: Karl Garcia, VANE MEDICAL RECORD [id number] ACCOUNT [id number] DATE OF BIRTH:02/26/1961 FACILITY: [facility] LOCATION: [facility] PHYSICIAN:Cleveland Yarbro VAN TRIGT III, MD  OPERATIVE REPORT  DATE OF PROCEDURE:  02/17/2020  OPERATIONS: 1.  Coronary artery bypass grafting x3 (left internal mammary artery to left anterior descending, saphenous vein graft to ramus intermediate, saphenous vein graft to posterior descending). 2.  Endoscopic harvest of right leg greater saphenous vein. 3.  Placement of left femoral arterial line for blood pressure monitoring.  SURGEON:  Kerin Perna, MD  ASSISTANT:  Jari Favre, PA-C.  ANESTHESIA:  General by Dr. Kipp Brood.  PREOPERATIVE DIAGNOSES:   1.  Non-ST elevation myocardial infarction.   2.  Severe 3-vessel coronary artery disease.  3.  Active smoking with chronic obstructive pulmonary disease. 4.  Peripheral vascular disease.  5.  Left subclavian stenosis.  POSTOPERATIVE DIAGNOSES:   1.  Non-ST elevation myocardial infarction.   2.  Severe 3-vessel coronary artery disease.  3.  Active smoking with chronic obstructive pulmonary disease. 4.  Peripheral vascular disease.  5.  Left subclavian stenosis.  DESCRIPTION OF PROCEDURE:  The patient was brought to preoperative holding area where informed consent was documented and final issues were addressed with the patient.  The patient's preoperative studies were carefully reviewed.  The vascular Doppler  studies indicated evidence of left subclavian stenosis and for that reason, I planned a free left mammary artery graft.  The evening before surgery during my initial discussion with the patient, we reviewed the details of surgery as well as the potential  risks of stroke, bleeding, infection, organ failure, prolonged ventilation due to COPD, and death.  He demonstrated his understanding and agreed to proceed with surgery under what I felt was an informed consent.  The patient was brought to the  operating room and placed supine on the operating table.  General anesthesia was induced.  He remained stable.  A transesophageal echo probe was placed by the anesthesia team and this demonstrated fairly well-preserved LV  function with mild apical hypokinesia.  The patient was prepped and draped as a sterile field.  A proper time-out was performed.  A sternal incision was made as the saphenous vein was harvested endoscopically from the right leg.  The left internal  mammary artery was harvested as a free graft.  It was a 1.7 mm vessel, but the flow was limited by a proximal subclavian artery left side stenosis.  It was placed in a warm papaverine saline container after harvest.  The sternal retractor was placed and the pericardium was opened and suspended.  Pursestrings were placed in the ascending aorta and right atrium.  After the vein was harvested, the patient was cannulated and placed on cardiopulmonary bypass.  The  coronaries were identified for grafting.  The posterior descending, LAD and ramus were adequate targets.  The circumflex was a small nondominant vessel distally and was too small to graft.  Cardioplegic cannulas were placed with both antegrade and retrograde cold blood cardioplegia.  The patient was cooled to 32 degrees.  The aortic crossclamp was applied and 1 L of cold blood cardioplegia was delivered in split doses between the antegrade  aortic and retrograde coronary sinus catheters.  There was good cardioplegic arrest and supple temperature dropped less than 14 degrees.  Cardioplegia was delivered every 20 minutes.  The distal coronary anastomoses were performed.  The first distal anastomosis was to the posterior descending.  This was a 1.5 mm vessel, proximal 80% stenosis.  Reverse saphenous vein  was sewn end-to-side with running 7-0 Prolene with good flow through  the graft.  Cardioplegia was redosed.  The second distal anastomosis was the ramus branch of the left circumflex.   This was a 1.5 mm vessel, proximal 80% stenosis.  Reverse saphenous vein was sewn end-to-side with running 7-0 Prolene with good flow through the graft.  Cardioplegia was  redosed.  The third distal anastomosis was the free mammary graft to the distal third of the LAD.  Proximally, it was heavily calcified and there was a proximal 90% stenosis.  The left IMA pedicle was sewn end-to-side to the 1.5 mm LAD with running 8-0 Prolene.   There was good flow through the anastomosis as noted by a gentle hand flush.  While the crossclamp was in place, 2 proximal vein anastomoses were performed on the ascending aorta using a 4.5 mm punch and running 6-0 Prolene.  There was evidence of atherosclerosis and some calcification in the ascending aorta.  The anastomoses,  however, were completed uneventfully and prior to removing the crossclamp, air was vented from the coronaries with a dose of retrograde warm blood cardioplegia to remove any air from the coronaries and left side of the heart.  The crossclamp was removed.  The heart resumed a spontaneous rhythm.  The vein grafts were de-aired and opened.  Each was checked proximally and distally and found to be hemostatic.  The proximal anastomosis of the free mammary graft was then sewn to the proximal hood of the ramus intermediate vein using running 7-0 Prolene.  The bulldog was removed and there was good flow through the mammary artery and good hemostasis.  The patient was then rewarmed and reperfused.  Temporary pacing wires were applied.  The lungs were expanded.  The ventilator was resumed.  When the patient was adequately rewarmed, he was weaned off cardiopulmonary bypass on low-dose milrinone with good  hemodynamics and normal LV on echo.  Protamine was administered without adverse reaction.  The mediastinum was irrigated.  The superior pericardial fat was closed over the aorta and vein grafts.  Anterior mediastinum and bilateral pleural tubes were placed  and brought out through separate incisions.  The sternum was closed with wire.  The  patient remained stable.  The pectoralis fascia was closed with a running #1 Vicryl.  Subcutaneous and skin layers were closed using running Vicryl and sterile dressings were applied.  Total cardiopulmonary bypass time was 130 minutes.  VN/NUANCE  D:02/17/2020 T:02/17/2020 JOB:[id number]

## 2020-02-17 NOTE — Progress Notes (Signed)
RT assessed pt for readiness to wean to next phase of heart wean. Pt is able to follow all of the previous request and perform w/out difficulty. Pt placed on CPAP/PSV 10/5 40%. Pt respiratory status is stable at this time. RT will continue to monitor.

## 2020-02-17 NOTE — Progress Notes (Signed)
  Date: 02/17/2020  Patient name: Karl Garcia  Medical record number: [id number]  Date of birth: [birth year]        I have seen and evaluated this patient and I have discussed the plan of care with the house staff. Please see their note for complete details. I concur with their findings with the following additions/corrections: Mr Coppa was seen this AM on team rounds. He was admitted for Cp, had cath with multivessel disease, and is now sch for CABG. Dr Alla German is now listed as primary.   Burns Spain, MD 02/17/2020, 8:42 AM

## 2020-02-17 NOTE — Progress Notes (Signed)
   Subjective:  HD 1 Overnight, no acute events reported.  Mr. Jatavious Peppard was evaluated at bedside this morning. He notes chest pain and shoulder pain are improved. He is anxious regarding his CABG today but notes that he is ready. No acute concerns at this time.   Objective:  Vital signs in last 24 hours: Vitals:   02/17/20 0020 02/17/20 0528 02/17/20 0749 02/17/20 0755  BP: 134/87 (!) 176/84  (!) 154/76  Pulse: 90 77    Resp: 15 14  16   Temp: 97.8 F (36.6 C) 98.5 F (36.9 C)  98.4 F (36.9 C)  TempSrc: Oral Oral  Oral  SpO2: 97% 93% 96%   Weight:  83.6 kg    Height:       CBC Latest Ref Rng & Units 02/17/2020 02/16/2020 09/23/2013  WBC 4.0 - 10.5 K/uL 6.8 5.3 8.8  Hemoglobin 13.0 - 17.0 g/dL 09/25/2013 40.9 81.1  Hematocrit 39 - 52 % 40.3 44.9 41.6  Platelets 150 - 400 K/uL 133(L) 157 156   BMP Latest Ref Rng & Units 02/17/2020 02/16/2020 09/23/2013  Glucose 70 - 99 mg/dL 09/25/2013) 782(N) 562(Z)  BUN 8 - 23 mg/dL 17 13 14   Creatinine 0.61 - 1.24 mg/dL 308(M 5.78  Sodium 135 - 145 mmol/L 135 140 140  Potassium 3.5 - 5.1 mmol/L 4.0 3.4(L) 4.0  Chloride 98 - 111 mmol/L 104 104 102  CO2 22 - 32 mmol/L 26 21(L) 22  Calcium 8.9 - 10.3 mg/dL 9.0 4.69) 9.1   Physical Exam  Constitutional: Appears well-developed and well-nourished. No distress.  Head: Normocephalic and atraumatic.  Eyes: Conjunctivae are normal.  Cardiovascular: Normal rate, regular rhythm and normal heart sounds.  Respiratory: Effort normal and breath sounds normal. No respiratory distress. No wheezes, rales or rhonchi GI: No distension. There is no tenderness. Normoactive bowel sounds  Musculoskeletal: No edema.  Neurological: Is alert and oriented, no apparent focal deficits noted Skin: Not diaphoretic. No erythema.  Psychiatric: Normal mood and affect. Behavior is normal. Judgment and thought content normal.   Assessment/Plan:  Active Problems:   Chest pain   NSTEMI (non-ST elevated myocardial  infarction) [facility]) Mr Shoji Pertuit is a 59 yr old male with history of alcohol abuse and tobacco use disorder admitted for NSTEMI s/p LHC with multivessel CAD.   NSTEMI: Patient presented with acute onset left sided dull chest pain radiating to left arm. EKG without ST segment changes but noted to have elevated troponins, had LHC yesterday with multivessel CAD and recommended for CABG. - Patient for CABG today - Continue IV heparin - Aspirin 81mg  daily - Lipitor 80mg  daily - Management per CT surgery and cardiology  Alcohol abuse:  Patient drinks 1 pint of vodka daily. CIWA scores 0 overnight.  - CIWA w/Ativan - Thiamine 100mg  daily - Folic acid 1mg  daily  Tobacco use disorder: - Continue nicotine patch 21mg  patch q24h  Prior to Admission Living Arrangement: Home Anticipated Discharge Location: Home vs SNF Barriers to Discharge: Continued medical management per cardiology and CTS   Silvestre Mesi, MD  Internal Medicine, PGY-1 02/17/2020, 8:45 AM [phone] After 5pm on weekdays and 1pm on weekends: On Call pager [phone]

## 2020-02-17 NOTE — Brief Op Note (Signed)
02/16/2020 - 02/17/2020  12:21 PM  PATIENT:  Karl Garcia  59 y.o. male  PRE-OPERATIVE DIAGNOSIS:  coronary artery disease  POST-OPERATIVE DIAGNOSIS:  coronary artery disease  PROCEDURE:  Procedure(s): CORONARY ARTERY BYPASS GRAFTING (CABG) x Three, using left internal mammry artery and right leg greater saphenous vein harvested endoscopically (N/A) TRANSESOPHAGEAL ECHOCARDIOGRAM (TEE) (N/A)  LIMA to LAD (T-graft off of the SVG) SVG to OM1 SVG to PDA  SURGEON:  Surgeon(s) and Role:    Kerin Perna, MD - Primary  PHYSICIAN ASSISTANT:  Jari Favre, PA-C   ANESTHESIA:   general  EBL: refer to anesthesia note  BLOOD ADMINISTERED:none  DRAINS: ROUTINE   LOCAL MEDICATIONS USED:  NONE  SPECIMEN:  No Specimen  DISPOSITION OF SPECIMEN:  N/A  COUNTS:  YES  DICTATION: .Dragon Dictation  PLAN OF CARE: Admit to inpatient   PATIENT DISPOSITION:  ICU - intubated and hemodynamically stable.   Delay start of Pharmacological VTE agent (>24hrs) due to surgical blood loss or risk of bleeding: yes

## 2020-02-17 NOTE — Progress Notes (Signed)
Pre Procedure note for inpatients:   Karl Garcia has been scheduled for CORONARY ARTERY BYPASS SURGERY  today. The various methods of treatment have been discussed with the patient. After consideration of the risks, benefits and treatment options the patient has consented to the planned procedure.   The patient has been seen and labs reviewed. There are no changes in the patient's condition to prevent proceeding with the planned procedure today.  Recent labs:  Lab Results  Component Value Date   WBC 6.8 02/17/2020   HGB 13.9 02/17/2020   HCT 40.3 02/17/2020   PLT 133 (L) 02/17/2020   GLUCOSE 135 (H) 02/17/2020   ALT 29 02/16/2020   AST 33 02/16/2020   NA 135 02/17/2020   K 4.0 02/17/2020   CL 104 02/17/2020   CREATININE 0.78 02/17/2020   BUN 17 02/17/2020   CO2 26 02/17/2020   TSH 1.563 02/17/2020   INR 0.96 09/23/2013   HGBA1C 6.8 (H) 02/16/2020    Mikey Bussing, MD 02/17/2020 7:42 AM

## 2020-02-17 NOTE — Progress Notes (Signed)
° °   °  [address]       [place] [address]             [phone]      S/p CABG  Intubated, sedated  BP 113/60    Pulse 80    Temp 98.4 F (36.9 C) (Oral)    Resp 14    Ht 5\' 11"  (1.803 m)    Wt 83.9 kg    SpO2 100%    BMI 25.80 kg/m  33/21 CI= 3.1 on milrinone @ 0.3  Intake/Output Summary (Last 24 hours) at 02/17/2020 1755 Last data filed at 02/17/2020 1600 Gross per 24 hour  Intake 4196.25 ml  Output 2050 ml  Net 2146.25 ml   Hct = 34 PLT 82K K= 4.7  Doing well early postop Good hemodynamics Thrombocytopenia but no clinical bleeding  04/18/2020 C. Viviann Spare, MD [facility] [phone]

## 2020-02-17 NOTE — Anesthesia Procedure Notes (Signed)
Central Venous Catheter Insertion Performed by: Kipp Brood, MD, anesthesiologist Start/End6/07/2020 9:40 AM, 02/17/2020 9:50 AM Patient location: Pre-op. Preanesthetic checklist: patient identified, IV checked, site marked, risks and benefits discussed, surgical consent, monitors and equipment checked, pre-op evaluation, timeout performed and anesthesia consent Hand hygiene performed  and maximum sterile barriers used  PA cath was placed.Swan type:thermodilution Procedure performed without using ultrasound guided technique. Attempts: 1 Patient tolerated the procedure well with no immediate complications.

## 2020-02-17 NOTE — Progress Notes (Signed)
RT assessed pts readiness to wean. Pt able to hold head off of bed for 20 sec, able to stick out tongue, squeeze hand, wiggle toes. Pt placed in first stage of heart wean mode. SIMV/PRVC/PSV  600/4/40%/+5. Pt respiratory status is stable at this time RT will continue to monitor.

## 2020-02-17 NOTE — Anesthesia Postprocedure Evaluation (Signed)
Anesthesia Post Note  Patient: Karl Garcia  Procedure(s) Performed: CORONARY ARTERY BYPASS GRAFTING (CABG) x Three, using left internal mammry artery and right leg greater saphenous vein harvested endoscopically (N/A Chest) TRANSESOPHAGEAL ECHOCARDIOGRAM (TEE) (N/A )     Patient location during evaluation: SICU Anesthesia Type: General Level of consciousness: sedated and patient remains intubated per anesthesia plan Pain management: pain level controlled Vital Signs Assessment: post-procedure vital signs reviewed and stable Respiratory status: patient remains intubated per anesthesia plan and patient on ventilator - see flowsheet for VS Cardiovascular status: stable Anesthetic complications: no   No complications documented.  Last Vitals:  Vitals:   02/17/20 0911 02/17/20 1656  BP: (!) 156/80 113/60  Pulse: 77 80  Resp:  14  Temp:    SpO2: 95% 100%    Last Pain:  Vitals:   02/17/20 0911  TempSrc:   PainSc: 0-No pain                 Shameka Aggarwal COKER

## 2020-02-18 ENCOUNTER — Inpatient Hospital Stay (HOSPITAL_COMMUNITY): Payer: Self-pay

## 2020-02-18 LAB — BASIC METABOLIC PANEL
Anion gap: 7 (ref 5–15)
Anion gap: 6 (ref 5–15)
BUN: 11 mg/dL (ref 8–23)
BUN: 11 mg/dL (ref 8–23)
CO2: 22 mmol/L (ref 22–32)
CO2: 22 mmol/L (ref 22–32)
Calcium: 7.2 mg/dL — ABNORMAL LOW (ref 8.9–10.3)
Calcium: 7 mg/dL — ABNORMAL LOW (ref 8.9–10.3)
Chloride: 107 mmol/L (ref 98–111)
Chloride: 102 mmol/L (ref 98–111)
Creatinine, Ser: 0.73 mg/dL (ref 0.61–1.24)
Creatinine, Ser: 0.76 mg/dL (ref 0.61–1.24)
GFR calc Af Amer: >60 mL/min (ref >60)
GFR calc Af Amer: >60 mL/min (ref >60)
GFR calc non Af Amer: >60 mL/min (ref >60)
GFR calc non Af Amer: >60 mL/min (ref >60)
Glucose, Bld: 112 mg/dL — ABNORMAL HIGH (ref 70–99)
Glucose, Bld: 222 mg/dL — ABNORMAL HIGH (ref 70–99)
Potassium: 4 mmol/L (ref 3.5–5.1)
Potassium: 3.7 mmol/L (ref 3.5–5.1)
Sodium: 136 mmol/L (ref 135–145)
Sodium: 130 mmol/L — ABNORMAL LOW (ref 135–145)

## 2020-02-18 LAB — POCT I-STAT 7, (LYTES, BLD GAS, ICA,H+H)
Acid-Base Excess: 1 mmol/L (ref 0.0–2.0)
Acid-Base Excess: 0 mmol/L (ref 0.0–2.0)
Acid-base deficit: 1 mmol/L (ref 0.0–2.0)
Acid-base deficit: 2 mmol/L (ref 0.0–2.0)
Bicarbonate: 23.8 mmol/L (ref 20.0–28.0)
Bicarbonate: 25.4 mmol/L (ref 20.0–28.0)
Bicarbonate: 24.3 mmol/L (ref 20.0–28.0)
Bicarbonate: 22.3 mmol/L (ref 20.0–28.0)
Calcium, Ion: 1.1 mmol/L — ABNORMAL LOW (ref 1.15–1.40)
Calcium, Ion: 1.12 mmol/L — ABNORMAL LOW (ref 1.15–1.40)
Calcium, Ion: 1.09 mmol/L — ABNORMAL LOW (ref 1.15–1.40)
Calcium, Ion: 1.07 mmol/L — ABNORMAL LOW (ref 1.15–1.40)
HCT: 34 % — ABNORMAL LOW (ref 39.0–52.0)
HCT: 33 % — ABNORMAL LOW (ref 39.0–52.0)
HCT: 33 % — ABNORMAL LOW (ref 39.0–52.0)
HCT: 32 % — ABNORMAL LOW (ref 39.0–52.0)
Hemoglobin: 11.6 g/dL — ABNORMAL LOW (ref 13.0–17.0)
Hemoglobin: 11.2 g/dL — ABNORMAL LOW (ref 13.0–17.0)
Hemoglobin: 11.2 g/dL — ABNORMAL LOW (ref 13.0–17.0)
Hemoglobin: 10.9 g/dL — ABNORMAL LOW (ref 13.0–17.0)
O2 Saturation: 89 %
O2 Saturation: 92 %
O2 Saturation: 96 %
O2 Saturation: 92 %
Patient temperature: 38
Patient temperature: 38
Patient temperature: 38.4
Patient temperature: 38.5
Potassium: 4.1 mmol/L (ref 3.5–5.1)
Potassium: 3.9 mmol/L (ref 3.5–5.1)
Potassium: 4 mmol/L (ref 3.5–5.1)
Potassium: 4.1 mmol/L (ref 3.5–5.1)
Sodium: 138 mmol/L (ref 135–145)
Sodium: 137 mmol/L (ref 135–145)
Sodium: 140 mmol/L (ref 135–145)
Sodium: 138 mmol/L (ref 135–145)
TCO2: 25 mmol/L (ref 22–32)
TCO2: 26 mmol/L (ref 22–32)
TCO2: 25 mmol/L (ref 22–32)
TCO2: 23 mmol/L (ref 22–32)
pCO2 arterial: 38.9 mmHg (ref 32.0–48.0)
pCO2 arterial: 38.6 mmHg (ref 32.0–48.0)
pCO2 arterial: 39.3 mmHg (ref 32.0–48.0)
pCO2 arterial: 36.7 mmHg (ref 32.0–48.0)
pH, Arterial: 7.398 (ref 7.350–7.450)
pH, Arterial: 7.43 (ref 7.350–7.450)
pH, Arterial: 7.404 (ref 7.350–7.450)
pH, Arterial: 7.398 (ref 7.350–7.450)
pO2, Arterial: 59 mmHg — ABNORMAL LOW (ref 83.0–108.0)
pO2, Arterial: 66 mmHg — ABNORMAL LOW (ref 83.0–108.0)
pO2, Arterial: 85 mmHg (ref 83.0–108.0)
pO2, Arterial: 69 mmHg — ABNORMAL LOW (ref 83.0–108.0)

## 2020-02-18 LAB — GLUCOSE, CAPILLARY
Glucose-Capillary: 98 mg/dL (ref 70–99)
Glucose-Capillary: 113 mg/dL — ABNORMAL HIGH (ref 70–99)
Glucose-Capillary: 121 mg/dL — ABNORMAL HIGH (ref 70–99)
Glucose-Capillary: 229 mg/dL — ABNORMAL HIGH (ref 70–99)
Glucose-Capillary: 171 mg/dL — ABNORMAL HIGH (ref 70–99)

## 2020-02-18 LAB — CBC
HCT: 32.7 % — ABNORMAL LOW (ref 39.0–52.0)
HCT: 31.3 % — ABNORMAL LOW (ref 39.0–52.0)
Hemoglobin: 11.2 g/dL — ABNORMAL LOW (ref 13.0–17.0)
Hemoglobin: 10.7 g/dL — ABNORMAL LOW (ref 13.0–17.0)
MCH: 34.7 pg — ABNORMAL HIGH (ref 26.0–34.0)
MCH: 34.9 pg — ABNORMAL HIGH (ref 26.0–34.0)
MCHC: 34.3 g/dL (ref 30.0–36.0)
MCHC: 34.2 g/dL (ref 30.0–36.0)
MCV: 101.2 fL — ABNORMAL HIGH (ref 80.0–100.0)
MCV: 102 fL — ABNORMAL HIGH (ref 80.0–100.0)
Platelets: 81 10*3/uL — ABNORMAL LOW (ref 150–400)
Platelets: 74 10*3/uL — ABNORMAL LOW (ref 150–400)
RBC: 3.23 MIL/uL — ABNORMAL LOW (ref 4.22–5.81)
RBC: 3.07 MIL/uL — ABNORMAL LOW (ref 4.22–5.81)
RDW: 13.2 % (ref 11.5–15.5)
RDW: 13.3 % (ref 11.5–15.5)
WBC: 8.5 10*3/uL (ref 4.0–10.5)
WBC: 8.9 10*3/uL (ref 4.0–10.5)
nRBC: 0 % (ref 0.0–0.2)
nRBC: 0 % (ref 0.0–0.2)

## 2020-02-18 LAB — MAGNESIUM
Magnesium: 2.5 mg/dL — ABNORMAL HIGH (ref 1.7–2.4)
Magnesium: 2.1 mg/dL (ref 1.7–2.4)

## 2020-02-18 MED ORDER — INSULIN ASPART 100 UNIT/ML ~~LOC~~ SOLN
0.0000 [IU] | SUBCUTANEOUS | Status: DC
  Administered 2020-02-18: 4 [IU] via SUBCUTANEOUS
  Administered 2020-02-18: 8 [IU] via SUBCUTANEOUS
  Administered 2020-02-19: 2 [IU] via SUBCUTANEOUS

## 2020-02-18 MED ORDER — POTASSIUM CHLORIDE CRYS ER 20 MEQ PO TBCR
20.0000 meq | EXTENDED_RELEASE_TABLET | ORAL | Status: AC
  Administered 2020-02-18 – 2020-02-19 (×3): 20 meq via ORAL
  Filled 2020-02-18 (×3): qty 1

## 2020-02-18 MED ORDER — LEVALBUTEROL HCL 1.25 MG/0.5ML IN NEBU: 1.2500 mg | INHALATION_SOLUTION | Freq: Four times a day (QID) | RESPIRATORY_TRACT | Status: DC | PRN

## 2020-02-18 MED ORDER — FUROSEMIDE 10 MG/ML IJ SOLN
20.0000 mg | Freq: Two times a day (BID) | INTRAMUSCULAR | Status: DC
  Administered 2020-02-18 – 2020-02-19 (×2): 20 mg via INTRAVENOUS
  Filled 2020-02-18 (×2): qty 2

## 2020-02-18 NOTE — Progress Notes (Signed)
      [address]       [place] [address]             [phone]      POD # 1   Sleeping currently  BP 114/71   Pulse 90   Temp 97.8 F (36.6 C) (Oral)   Resp 17   Ht 5\' 11"  (1.803 m)   Wt 89.5 kg   SpO2 96%   BMI 27.52 kg/m   2L [place] 96% sat  Intake/Output Summary (Last 24 hours) at 02/18/2020 1818 Last data filed at 02/18/2020 1600 Gross per 24 hour  Intake 1827.32 ml  Output 1099 ml  Net 728.32 ml  creatinine 0.76 Hct= 31  Doing well POD # 1  Margo Lama C. 04/19/2020, MD [facility] [phone]

## 2020-02-18 NOTE — Procedures (Signed)
Extubation Procedure Note  Patient Details:   Name: CAPERS HAGMANN DOB: 1961/06/04 MRN: [id number]   Airway Documentation:    Vent end date: 02/18/20 Vent end time: 0130   Evaluation  O2 sats: stable throughout Complications: No apparent complications Patient did tolerate procedure well. Bilateral Breath Sounds: Clear, Diminished   Yes   Pt had cuff leak, able to speak name, no stridor noted, productive strong cough. Pt respiratory status stable on 4Lpm [place] w/humidity.   Reginia Naas Shalla Bulluck 02/18/2020, 1:37 AM

## 2020-02-18 NOTE — Plan of Care (Signed)
°  Problem: Education: °Goal: Knowledge of General Education information will improve °Description: Including pain rating scale, medication(s)/side effects and non-pharmacologic comfort measures °Outcome: Progressing °  °Problem: Health Behavior/Discharge Planning: °Goal: Ability to manage health-related needs will improve °Outcome: Progressing °  °Problem: Clinical Measurements: °Goal: Ability to maintain clinical measurements within normal limits will improve °Outcome: Progressing °Goal: Will remain free from infection °Outcome: Progressing °  °Problem: Nutrition: °Goal: Adequate nutrition will be maintained °Outcome: Progressing °  °Problem: Coping: °Goal: Level of anxiety will decrease °Outcome: Progressing °  °Problem: Safety: °Goal: Ability to remain free from injury will improve °Outcome: Progressing °  °

## 2020-02-18 NOTE — Progress Notes (Signed)
RT placed pt on CPAP dream station on her home setting of 10 cmH2O w/2 Lpm bled into the system. Pts respiratory status is stable at this time. RT will continue to monitor.

## 2020-02-18 NOTE — Progress Notes (Signed)
1 Day Post-Op Procedure(s) (LRB): CORONARY ARTERY BYPASS GRAFTING (CABG) x Three, using left internal mammry artery and right leg greater saphenous vein harvested endoscopically (N/A) TRANSESOPHAGEAL ECHOCARDIOGRAM (TEE) (N/A) Subjective: C/o incisional pain  Objective: Vital signs in last 24 hours: Temp:  [96.4 F (35.8 C)-101.3 F (38.5 C)] 98.8 F (37.1 C) (06/12 0900) Pulse Rate:  [80-90] 90 (06/12 0832) Cardiac Rhythm: Atrial paced (06/12 0800) Resp:  [11-27] 22 (06/12 0900) BP: (81-140)/(58-82) 105/69 (06/12 0900) SpO2:  [73 %-100 %] 97 % (06/12 0900) Arterial Line BP: (75-142)/(48-71) 142/71 (06/12 0900) FiO2 (%):  [40 %-50 %] 40 % (06/12 0000) Weight:  [89.5 kg] 89.5 kg (06/12 0500)  Hemodynamic parameters for last 24 hours: PAP: (24-43)/(10-27) 43/22 CO:  [3.8 L/min-6 L/min] 4.7 L/min CI:  [1.9 L/min/m2-3 L/min/m2] 2.3 L/min/m2  Intake/Output from previous day: 06/11 0701 - 06/12 0700 In: 5379.5 [I.V.:3835.4; Blood:300; IV Piggyback:1244.1] Out: 3010 [Urine:2500; Chest Tube:510] Intake/Output this shift: Total I/O In: 119.7 [I.V.:119.7] Out: 135 [Urine:75; Chest Tube:60]  General appearance: alert, cooperative and no distress Neurologic: intact Heart: regular rate and rhythm Lungs: diminished breath sounds bibasilar Abdomen: normal findings: soft, non-tender  Lab Results: Recent Labs    02/17/20 2230 02/17/20 2247 02/18/20 0235 02/18/20 0403  WBC 8.6  --   --  8.5  HGB 11.1*   < > 10.9* 11.2*  HCT 32.9*   < > 32.0* 32.7*  PLT 86*  --   --  81*   < > = values in this interval not displayed.   BMET:  Recent Labs    02/17/20 2230 02/17/20 2247 02/18/20 0235 02/18/20 0403  NA 138   < > 138 136  K 4.1   < > 4.1 4.0  CL 108  --   --  107  CO2 22  --   --  22  GLUCOSE 111*  --   --  112*  BUN 11  --   --  11  CREATININE 0.64  --   --  0.73  CALCIUM 7.2*  --   --  7.2*   < > = values in this interval not displayed.    PT/INR:  Recent Labs     02/17/20 1715  LABPROT 15.3*  INR 1.3*   ABG    Component Value Date/Time   PHART 7.398 02/18/2020 0235   HCO3 22.3 02/18/2020 0235   TCO2 23 02/18/2020 0235   ACIDBASEDEF 2.0 02/18/2020 0235   O2SAT 92.0 02/18/2020 0235   CBG (last 3)  Recent Labs    02/18/20 0110 02/18/20 0231 02/18/20 0407  GLUCAP 98 113* 121*    Assessment/Plan: S/P Procedure(s) (LRB): CORONARY ARTERY BYPASS GRAFTING (CABG) x Three, using left internal mammry artery and right leg greater saphenous vein harvested endoscopically (N/A) TRANSESOPHAGEAL ECHOCARDIOGRAM (TEE) (N/A) -CV- in SR with good hemodynamics  Wean milrinone, dc Swan and A line RESP- COPD, IS, nebs RENAL- creatinine normal, lytes OK  Diurese ENDO- CBG well controlled Anemia secondary to ABL- mild, follow Thrombocytopenia- stable, no clinical bleeding  No enoxaparin, use SCD + ambulation for DVT prophylaxis Dc chest tubes Cardiac rehab  LOS: 2 days    Loreli Slot 02/18/2020

## 2020-02-19 ENCOUNTER — Inpatient Hospital Stay (HOSPITAL_COMMUNITY): Payer: Self-pay

## 2020-02-19 LAB — GLUCOSE, CAPILLARY
Glucose-Capillary: 119 mg/dL — ABNORMAL HIGH (ref 70–99)
Glucose-Capillary: 121 mg/dL — ABNORMAL HIGH (ref 70–99)
Glucose-Capillary: 138 mg/dL — ABNORMAL HIGH (ref 70–99)
Glucose-Capillary: 148 mg/dL — ABNORMAL HIGH (ref 70–99)
Glucose-Capillary: 201 mg/dL — ABNORMAL HIGH (ref 70–99)

## 2020-02-19 LAB — BASIC METABOLIC PANEL
Anion gap: 7 (ref 5–15)
BUN: 10 mg/dL (ref 8–23)
CO2: 23 mmol/L (ref 22–32)
Calcium: 7.4 mg/dL — ABNORMAL LOW (ref 8.9–10.3)
Chloride: 102 mmol/L (ref 98–111)
Creatinine, Ser: 0.68 mg/dL (ref 0.61–1.24)
GFR calc Af Amer: >60 mL/min (ref >60)
GFR calc non Af Amer: >60 mL/min (ref >60)
Glucose, Bld: 122 mg/dL — ABNORMAL HIGH (ref 70–99)
Potassium: 4 mmol/L (ref 3.5–5.1)
Sodium: 132 mmol/L — ABNORMAL LOW (ref 135–145)

## 2020-02-19 LAB — CBC
HCT: 30.7 % — ABNORMAL LOW (ref 39.0–52.0)
Hemoglobin: 10.2 g/dL — ABNORMAL LOW (ref 13.0–17.0)
MCH: 33.8 pg (ref 26.0–34.0)
MCHC: 33.2 g/dL (ref 30.0–36.0)
MCV: 101.7 fL — ABNORMAL HIGH (ref 80.0–100.0)
Platelets: 74 10*3/uL — ABNORMAL LOW (ref 150–400)
RBC: 3.02 MIL/uL — ABNORMAL LOW (ref 4.22–5.81)
RDW: 13.4 % (ref 11.5–15.5)
WBC: 8.6 10*3/uL (ref 4.0–10.5)
nRBC: 0 % (ref 0.0–0.2)

## 2020-02-19 MED ORDER — ZOLPIDEM TARTRATE 5 MG PO TABS: 5.0000 mg | ORAL_TABLET | Freq: Every evening | ORAL | Status: DC | PRN

## 2020-02-19 MED ORDER — SODIUM CHLORIDE 0.9% FLUSH: 3.0000 mL | INTRAVENOUS | Status: DC | PRN

## 2020-02-19 MED ORDER — FUROSEMIDE 40 MG PO TABS
40.0000 mg | ORAL_TABLET | Freq: Every day | ORAL | Status: DC
  Administered 2020-02-19 – 2020-02-21 (×3): 40 mg via ORAL
  Filled 2020-02-19 (×3): qty 1

## 2020-02-19 MED ORDER — POTASSIUM CHLORIDE CRYS ER 10 MEQ PO TBCR
20.0000 meq | EXTENDED_RELEASE_TABLET | Freq: Every day | ORAL | Status: DC
  Administered 2020-02-19 – 2020-02-21 (×3): 20 meq via ORAL
  Filled 2020-02-19 (×5): qty 2

## 2020-02-19 MED ORDER — INSULIN ASPART 100 UNIT/ML ~~LOC~~ SOLN
0.0000 [IU] | Freq: Three times a day (TID) | SUBCUTANEOUS | Status: DC
  Administered 2020-02-19 – 2020-02-21 (×3): 2 [IU] via SUBCUTANEOUS

## 2020-02-19 MED ORDER — MAGNESIUM HYDROXIDE 400 MG/5ML PO SUSP: 30.0000 mL | Freq: Every day | ORAL | Status: DC | PRN

## 2020-02-19 MED ORDER — DIPHENHYDRAMINE HCL 25 MG PO CAPS
25.0000 mg | ORAL_CAPSULE | Freq: Four times a day (QID) | ORAL | Status: DC | PRN
  Administered 2020-02-19 – 2020-02-21 (×4): 25 mg via ORAL
  Filled 2020-02-19 (×4): qty 1

## 2020-02-19 MED ORDER — SODIUM CHLORIDE 0.9% FLUSH
3.0000 mL | Freq: Two times a day (BID) | INTRAVENOUS | Status: DC
  Administered 2020-02-19 – 2020-02-20 (×2): 3 mL via INTRAVENOUS

## 2020-02-19 MED ORDER — ~~LOC~~ CARDIAC SURGERY, PATIENT & FAMILY EDUCATION: Freq: Once | Status: DC

## 2020-02-19 MED ORDER — ALUM & MAG HYDROXIDE-SIMETH 200-200-20 MG/5ML PO SUSP: 15.0000 mL | Freq: Four times a day (QID) | ORAL | Status: DC | PRN

## 2020-02-19 MED ORDER — SODIUM CHLORIDE 0.9 % IV SOLN: 250.0000 mL | INTRAVENOUS | Status: DC | PRN

## 2020-02-19 NOTE — Progress Notes (Signed)
Anesthesiology Follow-up:  Awake and alert, neuro intact, in good spirits, sitting in chair  VS: T- 36.7 BP- 130/71 HR- 90 (a-paced) RR- 14 O2 Sat 97% on 2L [place]  K-4.0 glucose-122 BUN/CR.- 10/0.68 H/H- 10.2/30.72  59 year old male 2 days S/P CABG X 3 following Nn-Stemi with preserved LV function. Extubated 8 hours post-op. Stable post-op course so far.  Kipp Brood

## 2020-02-19 NOTE — Progress Notes (Signed)
2 Days Post-Op Procedure(s) (LRB): CORONARY ARTERY BYPASS GRAFTING (CABG) x Three, using left internal mammry artery and right leg greater saphenous vein harvested endoscopically (N/A) TRANSESOPHAGEAL ECHOCARDIOGRAM (TEE) (N/A) Subjective: C/o incisional pain- was trying to not use narcotics  Objective: Vital signs in last 24 hours: Temp:  [97.8 F (36.6 C)-99.1 F (37.3 C)] 98.3 F (36.8 C) (06/13 0700) Pulse Rate:  [90] 90 (06/13 0815) Cardiac Rhythm: Normal sinus rhythm;Atrial paced (06/13 0800) Resp:  [10-23] 16 (06/13 0900) BP: (96-143)/(57-84) 118/75 (06/13 0900) SpO2:  [95 %-100 %] 98 % (06/13 0900) Arterial Line BP: (105-156)/(56-76) 136/63 (06/13 0500) Weight:  [90.4 kg] 90.4 kg (06/13 0500)  Hemodynamic parameters for last 24 hours: PAP: (26-33)/(15-16) 26/15  Intake/Output from previous day: 06/12 0701 - 06/13 0700 In: 1562.6 [P.O.:420; I.V.:822.4; IV Piggyback:320.1] Out: 2019 [Urine:1869; Chest Tube:60] Intake/Output this shift: Total I/O In: -  Out: 105 [Urine:105]  General appearance: alert, cooperative and no distress Neurologic: intact Heart: regular rate and rhythm Lungs: diminished breath sounds bibasilar Abdomen: normal findings: soft, non-tender  Lab Results: Recent Labs    02/18/20 1710 02/19/20 0310  WBC 8.9 8.6  HGB 10.7* 10.2*  HCT 31.3* 30.7*  PLT 74* 74*   BMET:  Recent Labs    02/18/20 1710 02/19/20 0310  NA 130* 132*  K 3.7 4.0  CL 102 102  CO2 22 23  GLUCOSE 222* 122*  BUN 11 10  CREATININE 0.76 0.68  CALCIUM 7.0* 7.4*    PT/INR:  Recent Labs    02/17/20 1715  LABPROT 15.3*  INR 1.3*   ABG    Component Value Date/Time   PHART 7.398 02/18/2020 0235   HCO3 22.3 02/18/2020 0235   TCO2 23 02/18/2020 0235   ACIDBASEDEF 2.0 02/18/2020 0235   O2SAT 92.0 02/18/2020 0235   CBG (last 3)  Recent Labs    02/19/20 0008 02/19/20 0312 02/19/20 0622  GLUCAP 148* 119* 121*    Assessment/Plan: S/P Procedure(s)  (LRB): CORONARY ARTERY BYPASS GRAFTING (CABG) x Three, using left internal mammry artery and right leg greater saphenous vein harvested endoscopically (N/A) TRANSESOPHAGEAL ECHOCARDIOGRAM (TEE) (N/A) Plan for transfer to step-down: see transfer orders  CV- stable in SR  ASA, beta blocker, statin RESP- On 2 L [place], continue IS, nebs RENAL- creatinine OK, mild hyopnatremia  Diurese ENDO- CBG improved- change to AC HS Anemia secondary to ABL- mild, follow Thrombocytopenia- stable, no enoxaparin Cardiac rehab DC central line, chest tubes, Foley   LOS: 3 days    Karl Garcia 02/19/2020

## 2020-02-19 NOTE — Progress Notes (Signed)
Pt transferred to 4EAST via wheelchair, on room air, NSR on monitor, denies pain at this time. Wife is at bedside.

## 2020-02-19 NOTE — Progress Notes (Signed)
Patient's skin is red and itching on assessment, Dr Dorris Fetch notified new order received. We'll continue to monitor.

## 2020-02-19 NOTE — Progress Notes (Signed)
Pt arrived to the floor via a wheelchair with the RN, pt alert and oriented denies pain at this time. Pt oriented to room, staff and equipment.we'll continue to monitor.

## 2020-02-19 NOTE — Plan of Care (Signed)
Pt is alert and oriented, on 2L [place], breathing is even and unlabored. Pt has been able to ambulate briefly in the hall way, only complains of persistent sternal pain at the chest tube site. Pt has been reassured and pain medication has been given. Pt practices sternal precautions and incentive spirometer., he pulls 1250 on the IS. Pt has only put out 90 ml serosanguinous fluid from the right pleural chest tube. Neo gtt was weaned off earlier in the night and aline was removed per order. Pt  is sitting up in the chair and call bell is within reach.  Problem: Education: Goal: Knowledge of General Education information will improve Description: Including pain rating scale, medication(s)/side effects and non-pharmacologic comfort measures Outcome: Progressing   Problem: Clinical Measurements: Goal: Diagnostic test results will improve Outcome: Progressing Goal: Respiratory complications will improve Outcome: Progressing   Problem: Activity: Goal: Risk for activity intolerance will decrease Outcome: Progressing   Problem: Coping: Goal: Level of anxiety will decrease Outcome: Progressing   Problem: Elimination: Goal: Will not experience complications related to urinary retention Outcome: Progressing

## 2020-02-19 NOTE — Progress Notes (Signed)
° °   °  [address]       [place] [address]             [phone]      BP (!) 165/79    Pulse 90    Temp 97.9 F (36.6 C) (Oral)    Resp 17    Ht 5\' 11"  (1.803 m)    Wt 90.4 kg    SpO2 (!) 89%    BMI 27.80 kg/m   Intake/Output Summary (Last 24 hours) at 02/19/2020 1953 Last data filed at 02/19/2020 1800 Gross per 24 hour  Intake 855.73 ml  Output 3845 ml  Net -2989.27 ml   No new issues today Awaiting bed on 4E  Dereka Lueras C. 02/21/2020, MD [facility] [phone]

## 2020-02-20 ENCOUNTER — Inpatient Hospital Stay (HOSPITAL_COMMUNITY): Payer: Self-pay

## 2020-02-20 ENCOUNTER — Encounter (HOSPITAL_COMMUNITY): Payer: Self-pay | Admitting: Cardiothoracic Surgery

## 2020-02-20 DIAGNOSIS — Z720 Tobacco use: Secondary | ICD-10-CM

## 2020-02-20 LAB — BASIC METABOLIC PANEL
Anion gap: 9 (ref 5–15)
BUN: 8 mg/dL (ref 8–23)
CO2: 25 mmol/L (ref 22–32)
Calcium: 7.7 mg/dL — ABNORMAL LOW (ref 8.9–10.3)
Chloride: 100 mmol/L (ref 98–111)
Creatinine, Ser: 0.8 mg/dL (ref 0.61–1.24)
GFR calc Af Amer: >60 mL/min (ref >60)
GFR calc non Af Amer: >60 mL/min (ref >60)
Glucose, Bld: 142 mg/dL — ABNORMAL HIGH (ref 70–99)
Potassium: 3.9 mmol/L (ref 3.5–5.1)
Sodium: 134 mmol/L — ABNORMAL LOW (ref 135–145)

## 2020-02-20 LAB — BPAM RBC
Blood Product Expiration Date: 202107122359
Blood Product Expiration Date: 202107122359
Unit Type and Rh: 5100
Unit Type and Rh: 5100

## 2020-02-20 LAB — LIPID PANEL
Cholesterol: 108 mg/dL (ref 0–200)
HDL: 30 mg/dL — ABNORMAL LOW (ref >40)
LDL Cholesterol: 56 mg/dL (ref 0–99)
Total CHOL/HDL Ratio: 3.6 RATIO
Triglycerides: 109 mg/dL (ref <150)
VLDL: 22 mg/dL (ref 0–40)

## 2020-02-20 LAB — GLUCOSE, CAPILLARY
Glucose-Capillary: 118 mg/dL — ABNORMAL HIGH (ref 70–99)
Glucose-Capillary: 134 mg/dL — ABNORMAL HIGH (ref 70–99)
Glucose-Capillary: 109 mg/dL — ABNORMAL HIGH (ref 70–99)
Glucose-Capillary: 118 mg/dL — ABNORMAL HIGH (ref 70–99)
Glucose-Capillary: 166 mg/dL — ABNORMAL HIGH (ref 70–99)

## 2020-02-20 LAB — CBC
HCT: 29.7 % — ABNORMAL LOW (ref 39.0–52.0)
Hemoglobin: 10 g/dL — ABNORMAL LOW (ref 13.0–17.0)
MCH: 34.4 pg — ABNORMAL HIGH (ref 26.0–34.0)
MCHC: 33.7 g/dL (ref 30.0–36.0)
MCV: 102.1 fL — ABNORMAL HIGH (ref 80.0–100.0)
Platelets: 88 10*3/uL — ABNORMAL LOW (ref 150–400)
RBC: 2.91 MIL/uL — ABNORMAL LOW (ref 4.22–5.81)
RDW: 13.1 % (ref 11.5–15.5)
WBC: 9 10*3/uL (ref 4.0–10.5)
nRBC: 0 % (ref 0.0–0.2)

## 2020-02-20 LAB — TYPE AND SCREEN
ABO/RH(D): O POS
Antibody Screen: NEGATIVE
Unit division: 0
Unit division: 0

## 2020-02-20 MED ORDER — LIVING WELL WITH DIABETES BOOK
Freq: Once | Status: AC
  Filled 2020-02-20: qty 1

## 2020-02-20 NOTE — Progress Notes (Addendum)
      [address]       [place] [address]             [phone]      3 Days Post-Op Procedure(s) (LRB): CORONARY ARTERY BYPASS GRAFTING (CABG) x Three, using left internal mammry artery and right leg greater saphenous vein harvested endoscopically (N/A) TRANSESOPHAGEAL ECHOCARDIOGRAM (TEE) (N/A) Subjective: Feels okay this morning. He is walking by himself and appetite is picking up.  Objective: Vital signs in last 24 hours: Temp:  [97.9 F (36.6 C)-99.2 F (37.3 C)] 98.8 F (37.1 C) (06/14 0429) Pulse Rate:  [72-90] 72 (06/14 0429) Cardiac Rhythm: Normal sinus rhythm (06/13 2114) Resp:  [13-24] 16 (06/14 0429) BP: (110-165)/(57-129) 119/67 (06/14 0429) SpO2:  [87 %-98 %] 97 % (06/14 0429) Weight:  [90.4 kg] 90.4 kg (06/14 0429)     Intake/Output from previous day: 06/13 0701 - 06/14 0700 In: 100 [IV Piggyback:100] Out: 2605 [Urine:2605] Intake/Output this shift: No intake/output data recorded.  General appearance: alert, cooperative and no distress Heart: regular rate and rhythm, S1, S2 normal, no murmur, click, rub or gallop Lungs: clear to auscultation bilaterally Abdomen: soft, non-tender; bowel sounds normal; no masses,  no organomegaly Extremities: extremities normal, atraumatic, no cyanosis or edema Wound: clean and dry  Lab Results: Recent Labs    02/19/20 0310 02/20/20 0453  WBC 8.6 9.0  HGB 10.2* 10.0*  HCT 30.7* 29.7*  PLT 74* 88*   BMET:  Recent Labs    02/19/20 0310 02/20/20 0453  NA 132* 134*  K 4.0 3.9  CL 102 100  CO2 23 25  GLUCOSE 122* 142*  BUN 10 8  CREATININE 0.68 0.80  CALCIUM 7.4* 7.7*    PT/INR:  Recent Labs    02/17/20 1715  LABPROT 15.3*  INR 1.3*   ABG    Component Value Date/Time   PHART 7.398 02/18/2020 0235   HCO3 22.3 02/18/2020 0235   TCO2 23 02/18/2020 0235   ACIDBASEDEF 2.0 02/18/2020 0235   O2SAT 92.0 02/18/2020 0235   CBG (last 3)  Recent Labs    02/19/20 1526 02/19/20 2212  02/20/20 0632  GLUCAP 138* 201* 134*    Assessment/Plan: S/P Procedure(s) (LRB): CORONARY ARTERY BYPASS GRAFTING (CABG) x Three, using left internal mammry artery and right leg greater saphenous vein harvested endoscopically (N/A) TRANSESOPHAGEAL ECHOCARDIOGRAM (TEE) (N/A)  1. CV-NSR in the 80s, BP well controlled. Continue asa, statin and metoprolol. 2. Pulm-tolerating room air with good oxygen saturation. CXR reviewed and stable 3. Renal-creatinine 0.80, electrolytes okay 4. H and H 10.0/29.7, expected acute blood loss anemia 5. Endo-blood glucose well controlled  Plan: Remove EPW today since rhythm has been stable. Patient is working on a bowel movement today since he has not had one since surgery. He plabs to ambulate in the halls today and hopefully can go home tomorrow.    LOS: 4 days    Sharlene Dory 02/20/2020  Hemodynamics stable, ambulating on room air Chest x-ray image satisfactory Postop labs reviewed Plan DC home in a.m. if he remains in sinus rhythm Discharge instructions reviewed with patient and mother

## 2020-02-20 NOTE — Progress Notes (Signed)
Inpatient Diabetes Program Recommendations  AACE/ADA: New Consensus Statement on Inpatient Glycemic Control (2015)  Target Ranges:  Prepandial:   less than 140 mg/dL      Peak postprandial:   less than 180 mg/dL (1-2 hours)      Critically ill patients:  140 - 180 mg/dL   Results for CAROLD, EISNER (MRN [id number]) as of 02/20/2020 11:47  Ref. Range 02/16/2020 12:20  Hemoglobin A1C Latest Ref Range: 4.8 - 5.6 % 6.8 (H)    Admit with: CP/ Underwent CABG 6/11  No Previous History of Diabetes   Current Orders: Novolog Moderate Correction Scale/ SSI (0-15 units) TID AC     MD- Patient will need CBG meter Rx at time of discharge home.  If pt does not have insurance, he can purchase CBG meter OTC at [facility] for low cost.   Received referral for pt from Kathyrn Drown, NP for Cards Team stating new diagnosis of Type 2 diabetes.  Met w/ pt this AM at bedside.  I asked pt if the Cardiology team had mentioned his A1c or anything about diabetes and pt stated "No".    Introduced self and the Diabetes Coordinator role for glucose management in the hospital.  Explained to pt that glucose levels often rise in pts experiencing heart disease and that diabetes and heart disease are closely linked.  Reviewed pt's current A1c of 6.8% with him.  Explained what an A1c is and what it measures.  Discussed w/ pt that an A1c of 6.5% is indicative of a positive diagnosis of diabetes.  Also discussed w/ pt that the NP note in the chart stated they may d/c pt home on Jardiance.  Explained what Vania Rea is, how to take, side effects.  Also explained to pt that Jardiance helps to reduce risk for further cardiac events.  Explained what an A1C is, basic pathophysiology of DM Type 2, basic home care, basic diabetes diet nutrition principles, importance of checking CBGs and maintaining good CBG control to prevent long-term and short-term complications.  Also reviewed blood sugar goals and A1c goals for home.    RNs  to provide ongoing basic DM education at bedside with this patient.  Have ordered educational booklet and RD consult for DM diet education for this patient.  Gave pt 3 educational pamphlets on basic diabetes info.  Asked pt to read through them at his pace and write down any questions if he has them.  Pt stated he would read them when he feels up to it.     --Will follow patient during hospitalization--  Wyn Quaker RN, MSN, CDE Diabetes Coordinator Inpatient Glycemic Control Team Team Pager: [phone] (8a-5p)

## 2020-02-20 NOTE — Discharge Summary (Addendum)
[address]       [place] [address]             [phone]      Physician Discharge Summary  Patient ID: Karl Garcia MRN: [id number] DOB/AGE: 1960-09-12 59 y.o.  Admit date: 02/16/2020 Discharge date: 02/21/2020  Admission Diagnoses:  Patient Active Problem List   Diagnosis Date Noted   Chest pain 02/16/2020   NSTEMI (non-ST elevated myocardial infarction) (HCC) 02/16/2020    Discharge Diagnoses:  Active Problems:   Chest pain   NSTEMI (non-ST elevated myocardial infarction) (HCC)   S/P CABG x 3   Discharged Condition: good  HPI:     Mr. Petrich is a 59 yo male with no known medical history.  However he does not have any routine medical care and was last seen by a physician in the ED.  He is a current smoker and drinks a pint of alcohol daily.  He presented to the ED today with complaints of chest pain.  He states the pain awoke his from sleep and radiated to his arm.  He describes the pain as an ache and denied N/V, shortness of breath and diaphoresis.  Workup in the ED showed NSR with LT atrial enlargement w/o significant changes.  His Troponin levels were positive.  Patient was given SL NTG and Morphine with resolution of symptoms.  Cardiology evaluation was requested who felt patient should be admitted for further workup.  A CT scan from approximately 7 years ago was reviewed and showed plaque in the aorta.  Due to this patient was taken for cardiac catheterization. This showed a preserved EF of 45-50% and multivessel CAD.  It was felt coronary bypass grafting would be indicated and TCTS consult was requested.  Currently, the patient states he has mild chest discomfort off and on.  He works full time as a Writer.  He admits to nicotine and alcohol use.  He again denied medical problems, but admits to not being evaluated by a physician for a long time.  He is agreeable to proceed with surgery.      Hospital Course:   Mr. Mccarthy is a 59 year  old male patient who underwent coronary artery bypass grafting x 3 by Dr. Donata Clay. He tolerated the procedure well and was transferred to the surgical ICU for continued care. He was extubated in a timely manner. POD 1 his only complaint was incisional pain. He remained on milrinone. We discontinued his lines and chest tubes. He remained on 2L [place] which we continued to wean. He continued on a diuretic for fluid overload. His central line and foley were removed POD 2. He was cleared for transfer to the step-down unit for continued care. POD 3 he continued to progress. We removed his epicardial pacing wires since he remained in normal sinus rhythm. Cardiology continued to follow along with him. He continued to progress on the floor. POD 4 he was ambulating with limited assistance, his incisions were healing well, he was tolerating room air, and he was ready for discharge home. Will provide a prescription for Ativan Prn for home to assist with alcohol dependence. Cessation is recommended.    Consults: Cardiology, cardiac rehab  Significant Diagnostic Studies:   CLINICAL DATA:  59 year old male with atelectasis.   EXAM: CHEST - 2 VIEW   COMPARISON:  Chest x-ray 02/19/2020.   FINDINGS: Previously noted right IJ Cordis and right-sided chest tube have both been removed. No  appreciable right-sided pneumothorax. Lung volumes are normal. There are some bibasilar opacities (right greater than left) which are favored to reflect areas of resolving postoperative subsegmental atelectasis. Small bilateral pleural effusions (right greater than left). No definite consolidative airspace disease. No evidence of pulmonary edema. Heart size is normal. Upper mediastinal contours are within normal limits. Status post median sternotomy for CABG.   IMPRESSION: 1. Postoperative changes and support apparatus, as above. 2. Resolving bibasilar subsegmental atelectasis and small bilateral pleural effusions (right  greater than left).     Electronically Signed   By: Vinnie Langton M.D.   On: 02/20/2020 08:11    Treatments:  NAME: RONALD, LONDO MEDICAL RECORD [id number] ACCOUNT [id number] DATE OF BIRTH:1961-01-23 FACILITY: [facility] LOCATION: [facility] PHYSICIAN:Loranzo Desha VAN TRIGT III, MD   OPERATIVE REPORT   DATE OF PROCEDURE:  02/17/2020   OPERATIONS: 1.  Coronary artery bypass grafting x3 (left internal mammary artery to left anterior descending, saphenous vein graft to ramus intermediate, saphenous vein graft to posterior descending). 2.  Endoscopic harvest of right leg greater saphenous vein. 3.  Placement of left femoral arterial line for blood pressure monitoring.   SURGEON:  Ivin Poot, MD   ASSISTANT:  Nicholes Rough, PA-C.   ANESTHESIA:  General by Dr. Roberts Gaudy.  Discharge Exam: Blood pressure 133/79, pulse 91, temperature 98.7 F (37.1 C), temperature source Oral, resp. rate 15, height 5\' 11"  (1.803 m), weight 87.5 kg, SpO2 99 %.   General appearance: alert, cooperative and no distress Heart: regular rate and rhythm, S1, S2 normal, no murmur, click, rub or gallop Lungs: clear to auscultation bilaterally Abdomen: soft, non-tender; bowel sounds normal; no masses,  no organomegaly Extremities: extremities normal, atraumatic, no cyanosis or edema Wound: clean and dry  Disposition: Discharge disposition: 01-Home or Self Care       Discharge Instructions     Amb Referral to Cardiac Rehabilitation   Complete by: As directed    Diagnosis: CABG   CABG X ___: 3   After initial evaluation and assessments completed: Virtual Based Care may be provided alone or in conjunction with Phase 2 Cardiac Rehab based on patient barriers.: Yes      Allergies as of 02/21/2020   No Known Allergies      Medication List     TAKE these medications    acetaminophen 500 MG tablet Commonly known as: TYLENOL Take 2 tablets (1,000 mg total) by mouth every 6 (six) hours.     aspirin 325 MG EC tablet Take 1 tablet (325 mg total) by mouth daily.   atorvastatin 80 MG tablet Commonly known as: LIPITOR Take 1 tablet (80 mg total) by mouth daily.   cephALEXin 500 MG capsule Commonly known as: Keflex Take 1 capsule (500 mg total) by mouth 3 (three) times daily for 10 days.   furosemide 40 MG tablet Commonly known as: LASIX Take 1 tablet (40 mg total) by mouth daily.   LORazepam 1 MG tablet Commonly known as: Ativan Take 1 tablet (1 mg total) by mouth 2 (two) times daily as needed for anxiety.   metoprolol tartrate 25 MG tablet Commonly known as: LOPRESSOR Take 0.5 tablets (12.5 mg total) by mouth 2 (two) times daily.   oxyCODONE 5 MG immediate release tablet Commonly known as: Oxy IR/ROXICODONE Take 1 tablet (5 mg total) by mouth every 6 (six) hours as needed for severe pain.   potassium chloride SA 20 MEQ tablet Commonly known as: KLOR-CON Take 1 tablet (20 mEq total) by  mouth daily.   thiamine 100 MG tablet Take 1 tablet (100 mg total) by mouth daily.        Follow-up Information     Kerin Perna, MD Follow up.   Specialty: Cardiothoracic Surgery Why: Your wound check is on 6/22 at 330pm. Your follow-up appointment is on 03/21/2020 at 10:30am. Please arrive at 10:00am for a chest xray located at [facility] which is on the first floor of our building Contact information: [address] [place] [address] [phone]         Tereso Newcomer T, PA-C Follow up on 03/13/2020.   Specialties: Cardiology, Physician Assistant Why: Please arrive 15 minutes early for your at 8:45am post-hospital cardiology follow-up appointment  Contact information: [address] [place] [address] [phone]                The patient has been discharged on:   1.Beta Blocker:  Yes [ yes  ]                              No   [   ]                              If No, reason:  2.Ace Inhibitor/ARB: Yes  [   ]                                     No  [  no  ]                                     If No, reason: BP well controlled.   3.Statin:   Yes [ yes  ]                  No  [   ]                  If No, reason:  4.Ecasa:  Yes  [ yes  ]                  No   [   ]                  If No, reason:    Signed: Sharlene Dory 02/21/2020, 10:21 AM  patient examined and medical record reviewed,agree with above note. Kathlee Nations Trigt III 02/22/2020

## 2020-02-20 NOTE — Progress Notes (Signed)
Mobility Specialist - Progress Note   02/20/20 1635  Mobility  Activity Refused mobility  Mobility performed by Mobility specialist   Pt refused due to fatigue and stated he has already ambulated today.   Karl Garcia Mobility Specialist

## 2020-02-20 NOTE — Addendum Note (Signed)
Addendum  created 02/20/20 0805 by Sonda Primes, CRNA   Order list changed

## 2020-02-20 NOTE — Progress Notes (Signed)
EPWs removed per protocol, pt tolerated well.  Instructed on one hour of bedrest with BP monitoring.  Will continue to monitor. 

## 2020-02-20 NOTE — Progress Notes (Addendum)
Progress Note  Patient Name: Karl Garcia Date of Encounter: 02/20/2020  Primary Cardiologist: Dr. Dorris Carnes, MD   Subjective   Doing well today with no specific complaints. Ambulated with cardiac rehab.    Inpatient Medications    Scheduled Meds: . acetaminophen  1,000 mg Oral Q6H   Or  . acetaminophen (TYLENOL) oral liquid 160 mg/5 mL  1,000 mg Per Tube Q6H  . aspirin EC  325 mg Oral Daily   Or  . aspirin  324 mg Per Tube Daily  . atorvastatin  80 mg Oral Daily  . bisacodyl  10 mg Oral Daily   Or  . bisacodyl  10 mg Rectal Daily  . chlorhexidine gluconate (MEDLINE KIT)  15 mL Mouth Rinse BID  . Chlorhexidine Gluconate Cloth  6 each Topical Daily  . [facility] Patient [facility] Family Education   Does not apply Once  . docusate sodium  200 mg Oral Daily  . folic acid  1 mg Oral Daily  . furosemide  40 mg Oral Daily  . guaiFENesin  600 mg Oral BID  . insulin aspart  0-15 Units Subcutaneous TID WC  . LORazepam  0-4 mg Oral Q12H  . metoprolol tartrate  12.5 mg Oral BID   Or  . metoprolol tartrate  12.5 mg Per Tube BID  . mometasone-formoterol  2 puff Inhalation BID  . pantoprazole  40 mg Oral Daily  . potassium chloride  20 mEq Oral Daily  . sodium chloride flush  3 mL Intravenous Q12H  . thiamine  100 mg Oral Daily   Or  . thiamine  100 mg Intravenous Daily   Continuous Infusions: . sodium chloride     PRN Meds: sodium chloride, alum & mag hydroxide-simeth, diphenhydrAMINE, levalbuterol, magnesium hydroxide, metoprolol tartrate, ondansetron (ZOFRAN) IV, oxyCODONE, sodium chloride flush, traMADol, zolpidem   Vital Signs    Vitals:   02/19/20 2128 02/20/20 0429 02/20/20 0810 02/20/20 0916  BP:  119/67  122/74  Pulse:  72  78  Resp:  16  17  Temp:  98.8 F (37.1 C)  98.4 F (36.9 C)  TempSrc:  Oral  Oral  SpO2: 96% 97% 94% 93%  Weight:  90.4 kg    Height:        Intake/Output Summary (Last 24 hours) at 02/20/2020 0923 Last data filed  at 02/19/2020 2000 Gross per 24 hour  Intake 100 ml  Output 2500 ml  Net -2400 ml   Filed Weights   02/18/20 0500 02/19/20 0500 02/20/20 0429  Weight: 89.5 kg 90.4 kg 90.4 kg    Physical Exam   General: Well developed, well nourished, NAD Neck: Negative for carotid bruits. No JVD Lungs:Clear to ausculation bilaterally. No wheezes, rales, or rhonchi. Breathing is unlabored. Cardiovascular: RRR with S1 S2. No murmurs Extremities: No edema. Radial pulses 2+ bilaterally Neuro: Alert and oriented. No focal deficits. No facial asymmetry. MAE spontaneously. Psych: Responds to questions appropriately with normal affect.    Labs    Chemistry Recent Labs  Lab 02/16/20 1220 02/17/20 0032 02/18/20 1710 02/19/20 0310 02/20/20 0453  NA  --    < > 130* 132* 134*  K  --    < > 3.7 4.0 3.9  CL  --    < > 102 102 100  CO2  --    < > _0 GLUCOSE  --    < > 222* 122* 142*  BUN  --    < >  _0 CREATININE  --    < > 0.76 0.68 0.80  CALCIUM  --    < > 7.0* 7.4* 7.7*  PROT 6.8  --   --   --   --   ALBUMIN 3.8  --   --   --   --   AST 33  --   --   --   --   ALT 29  --   --   --   --   ALKPHOS 54  --   --   --   --   BILITOT 0.8  --   --   --   --   GFRNONAA  --    < > >60 >60 >60  GFRAA  --    < > >60 >60 >60  ANIONGAP  --    < > _1 < > = values in this interval not displayed.     Hematology Recent Labs  Lab 02/18/20 1710 02/19/20 0310 02/20/20 0453  WBC 8.9 8.6 9.0  RBC 3.07* 3.02* 2.91*  HGB 10.7* 10.2* 10.0*  HCT 31.3* 30.7* 29.7*  MCV 102.0* 101.7* 102.1*  MCH 34.9* 33.8 34.4*  MCHC 34.2 33.2 33.7  RDW 13.3 13.4 13.1  PLT 74* 74* 88*   Lipid Panel  No results found for: CHOL, TRIG, HDL, CHOLHDL, VLDL, LDLCALC, LDLDIRECT, LABVLDL   Cardiac EnzymesNo results for input(s): TROPONINI in the last 168 hours. No results for input(s): TROPIPOC in the last 168 hours.   BNPNo results for input(s): BNP, PROBNP in the last 168 hours.   DDimer No results for  input(s): DDIMER in the last 168 hours.   Radiology    DG Chest 2 View  Result Date: 02/20/2020 CLINICAL DATA:  59 year old male with atelectasis. EXAM: CHEST - 2 VIEW COMPARISON:  Chest x-ray 02/19/2020. FINDINGS: Previously noted right IJ Cordis and right-sided chest tube have both been removed. No appreciable right-sided pneumothorax. Lung volumes are normal. There are some bibasilar opacities (right greater than left) which are favored to reflect areas of resolving postoperative subsegmental atelectasis. Small bilateral pleural effusions (right greater than left). No definite consolidative airspace disease. No evidence of pulmonary edema. Heart size is normal. Upper mediastinal contours are within normal limits. Status post median sternotomy for CABG. IMPRESSION: 1. Postoperative changes and support apparatus, as above. 2. Resolving bibasilar subsegmental atelectasis and small bilateral pleural effusions (right greater than left). Electronically Signed   By: Vinnie Langton M.D.   On: 02/20/2020 08:11   DG Chest Port 1 View  Result Date: 02/19/2020 CLINICAL DATA:  Post CABG EXAM: PORTABLE CHEST 1 VIEW COMPARISON:  02/18/2020; 02/17/2020; 02/16/2020 FINDINGS: Grossly unchanged cardiac silhouette and mediastinal contours post median sternotomy and CABG. Interval removal of PA catheter with remaining vascular sheath tip overlying the mid SVC. Interval removal of mediastinal drain and left-sided chest tube. Right basilar chest tube remains. No pneumothorax. Slight increase in size of now small right and trace left-sided effusions with associated worsening bibasilar heterogeneous/consolidative opacities, right greater than left. No evidence of edema. No acute osseous abnormalities. IMPRESSION: 1. Interval removal of support apparatus as above.  No pneumothorax. 2. Slight increase in size of small right and trace left-sided pleural effusions with worsening bibasilar opacities, likely atelectasis.  Electronically Signed   By: Sandi Mariscal M.D.   On: 02/19/2020 06:21   Telemetry    02/20/20 NSR - Personally Reviewed  ECG    No new tracing  as of 02/20/20- Personally Reviewed  Cardiac Studies   LHC 02/16/20:   Ramus lesion is 90% stenosed.  Prox Cx lesion is 60% stenosed.  Ost LM to Mid LM lesion is 25% stenosed.  Mid LAD to Dist LAD lesion is 90% stenosed.  Mid LAD lesion is 90% stenosed.  Ost LAD to Prox LAD lesion is 70% stenosed.  Prox RCA to Mid RCA lesion is 65% stenosed.  There is mild to moderate left ventricular systolic dysfunction.  LV end diastolic pressure is mildly elevated.  The left ventricular ejection fraction is 45-50% by visual estimate.   Echo 02/16/20:  1. Left ventricular ejection fraction, by estimation, is 55 to 60%. The  left ventricle has normal function. The left ventricle has no regional  wall motion abnormalities. There is mild concentric left ventricular  hypertrophy. Left ventricular diastolic  parameters are consistent with Grade II diastolic dysfunction  (pseudonormalization).  2. Right ventricular systolic function is normal. The right ventricular  size is normal.  3. Left atrial size was moderately dilated.  4. The mitral valve is normal in structure. Trivial mitral valve  regurgitation. No evidence of mitral stenosis.  5. The aortic valve is tricuspid. Aortic valve regurgitation is not  visualized. No aortic stenosis is present.  6. The inferior vena cava is dilated in size with >50% respiratory  variability, suggesting right atrial pressure of 8 mmHg.   Patient Profile     59 y.o. male with a hx of ETOH and tobacco use who presented to [facility] with chest pain and elevated troponin>>found to have three vessel CAD>>now s/p CABG   Assessment & Plan    1. 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  Both  vessels are calcified. The circumflex is nondominant and has a noncritical lesion in the mid AV groove.  The RCA is large dominant vessel with a 60% smooth lesion in the mid vessel. -Underwent CABG with LIMA to LAD, SVG to ramus intermediate, SVG to PDA on 02/17/20 -Echo with LVEF at 55-60% with NRWMA and G2DD -Continue ASA, high intensity statin, metoprolol tartrate 12.66m PO BID -Obtain lipid panel   2. Tobacco use: -Cessation strongly encouraged   3. HTN: -Stable, 122/74>119/67>121/79 -Metoprolol 12.531mPO BID   4. Hx of ETOH use: -No w/d symptoms today  -Cessation strongly encouraged  -Wife reports daily alcohol use>>could be up to one gallon per day   5. DM2: -HbA1c, 6.8 -Consider adding jardiance>>needs close follow up with PCP in the OP setting  -SSI while inpatient status -DM coordinator for education     Signed, JiKathyrn DrownP-C [facility] Pager: [phone]/14/2021, 9:23 AM      Patient seen and examined. Agree with assessment and plan. Feels well. No recurrent chest pain; day his bypass was on the 11th; Day 3 status post CABG revascularization with LIMA to LAD, SVG to ramus intermediate, and SVG to PDA.  Rhythm stable.  I discussed the importance of complete smoking cessation.  Good candidate for post CABG  phase 2 cardiac rehab.  Now on atorvastatin 80 mg daily with target LDL less than 70.  Continues to be mildly anemic with mild macrocytosis and MCV 102.  Continues to be on metoprolol tartrate now orally 12.5 mg twice a day.  Diabetic on insulin.  Tentative plan is for possible discharge tomorrow.   ThTroy SineMD, FASan Antonio State Hospital  02/20/2020 12:29 PM    For questions or updates, please contact   Please consult [email or web address] for contact info under Cardiology/STEMI.

## 2020-02-20 NOTE — Progress Notes (Signed)
CARDIAC REHAB PHASE I   PRE:  Rate/Rhythm: 85 SR with PVCs    BP: sitting 129/77    SaO2: 92 RA  MODE:  Ambulation: 470 ft   POST:  Rate/Rhythm: 111 ST    BP: sitting 160/77     SaO2: 96 RA   Pt ambulated for third time today. Did not need RW, steady, quick pace. Pt did c/o hips being tight with distance. Sts this is normal for him, probably due to claudication.   Discussed sternal precautions, IS, exercise, diet, smoking cessation, and CRPII with pt and wife. Made aware of elevated A1C. Pt was somewhat flat regarding smoking, ETOH, diet, and exercise. He seems overwhelmed. Encouraged questions if any. Will refer to virtual CRPII [place]. Pt is interested in participating in Virtual Cardiac and Pulmonary Rehab. Pt advised that Virtual Cardiac and Pulmonary Rehab is provided at no cost to the patient.  Checklist:  1. Pt has smart device  ie smartphone and/or ipad for downloading an app  Yes 2. Reliable internet/wifi service    Yes 3. Understands how to use their smartphone and navigate within an app.  Yes Pt verbalized understanding and is in agreement. [id number]  Harriet Masson CES, ACSM 02/20/2020 10:01 AM

## 2020-02-21 ENCOUNTER — Telehealth: Payer: Self-pay | Admitting: Physician Assistant

## 2020-02-21 DIAGNOSIS — E118 Type 2 diabetes mellitus with unspecified complications: Secondary | ICD-10-CM

## 2020-02-21 DIAGNOSIS — E785 Hyperlipidemia, unspecified: Secondary | ICD-10-CM

## 2020-02-21 DIAGNOSIS — Z951 Presence of aortocoronary bypass graft: Secondary | ICD-10-CM

## 2020-02-21 LAB — GLUCOSE, CAPILLARY: Glucose-Capillary: 125 mg/dL — ABNORMAL HIGH (ref 70–99)

## 2020-02-21 MED ORDER — METOPROLOL TARTRATE 25 MG PO TABS: 12.5000 mg | ORAL_TABLET | Freq: Two times a day (BID) | ORAL | 1 refills | Status: DC

## 2020-02-21 MED ORDER — POTASSIUM CHLORIDE CRYS ER 20 MEQ PO TBCR: 20.0000 meq | EXTENDED_RELEASE_TABLET | Freq: Every day | ORAL | 0 refills | Status: DC

## 2020-02-21 MED ORDER — CEPHALEXIN 500 MG PO CAPS: 500.0000 mg | ORAL_CAPSULE | Freq: Three times a day (TID) | ORAL | 0 refills | Status: AC

## 2020-02-21 MED ORDER — LORAZEPAM 1 MG PO TABS
1.0000 mg | ORAL_TABLET | Freq: Two times a day (BID) | ORAL | 0 refills | Status: DC | PRN
Start: 2020-02-21 — End: 2020-08-15

## 2020-02-21 MED ORDER — OXYCODONE HCL 5 MG PO TABS: 5.0000 mg | ORAL_TABLET | Freq: Four times a day (QID) | ORAL | 0 refills | Status: DC | PRN

## 2020-02-21 MED ORDER — LORAZEPAM 1 MG PO TABS
1.0000 mg | ORAL_TABLET | Freq: Two times a day (BID) | ORAL | 0 refills | Status: DC | PRN
Start: 2020-02-21 — End: 2020-02-21

## 2020-02-21 MED ORDER — THIAMINE HCL 100 MG PO TABS: 100.0000 mg | ORAL_TABLET | Freq: Every day | ORAL | 1 refills | Status: DC

## 2020-02-21 MED ORDER — ACETAMINOPHEN 500 MG PO TABS: 1000.0000 mg | ORAL_TABLET | Freq: Four times a day (QID) | ORAL | 0 refills | Status: DC

## 2020-02-21 MED ORDER — FUROSEMIDE 40 MG PO TABS: 40.0000 mg | ORAL_TABLET | Freq: Every day | ORAL | 0 refills | Status: DC

## 2020-02-21 MED ORDER — CEPHALEXIN 500 MG PO CAPS: 500.0000 mg | ORAL_CAPSULE | Freq: Three times a day (TID) | ORAL | 0 refills | Status: DC

## 2020-02-21 MED ORDER — ATORVASTATIN CALCIUM 80 MG PO TABS: 80.0000 mg | ORAL_TABLET | Freq: Every day | ORAL | 1 refills | Status: DC

## 2020-02-21 MED ORDER — ASPIRIN 325 MG PO TBEC: 325.0000 mg | DELAYED_RELEASE_TABLET | Freq: Every day | ORAL | 0 refills | Status: DC

## 2020-02-21 MED FILL — Lidocaine HCl Local Soln Prefilled Syringe 100 MG/5ML (2%): INTRAMUSCULAR | Qty: 5 | Status: AC

## 2020-02-21 MED FILL — Electrolyte-R (PH 7.4) Solution: INTRAVENOUS | Qty: 4000 | Status: AC

## 2020-02-21 MED FILL — Sodium Chloride IV Soln 0.9%: INTRAVENOUS | Qty: 2000 | Status: AC

## 2020-02-21 MED FILL — Heparin Sodium (Porcine) Inj 1000 Unit/ML: INTRAMUSCULAR | Qty: 10 | Status: AC

## 2020-02-21 MED FILL — Sodium Bicarbonate IV Soln 8.4%: INTRAVENOUS | Qty: 50 | Status: AC

## 2020-02-21 MED FILL — Mannitol IV Soln 20%: INTRAVENOUS | Qty: 500 | Status: AC

## 2020-02-21 MED FILL — Magnesium Sulfate Inj 50%: INTRAMUSCULAR | Qty: 10 | Status: AC

## 2020-02-21 MED FILL — Potassium Chloride Inj 2 mEq/ML: INTRAVENOUS | Qty: 40 | Status: AC

## 2020-02-21 MED FILL — Heparin Sodium (Porcine) Inj 1000 Unit/ML: INTRAMUSCULAR | Qty: 30 | Status: AC

## 2020-02-21 NOTE — Discharge Instructions (Signed)

## 2020-02-21 NOTE — Progress Notes (Signed)
Pt ambulating independently. Reviewed sternal precautions and ambulating at home. No questions. [phone] Ethelda Chick CES, ACSM 8:23 AM 02/21/2020

## 2020-02-21 NOTE — Progress Notes (Addendum)
Progress Note  Patient Name: Karl Garcia Date of Encounter: 02/21/2020  Primary Cardiologist: Dr. Dorris Carnes, MD  Subjective   Doing well today. No complaints. Home today   Inpatient Medications    Scheduled Meds: . acetaminophen  1,000 mg Oral Q6H   Or  . acetaminophen (TYLENOL) oral liquid 160 mg/5 mL  1,000 mg Per Tube Q6H  . aspirin EC  325 mg Oral Daily   Or  . aspirin  324 mg Per Tube Daily  . atorvastatin  80 mg Oral Daily  . bisacodyl  10 mg Oral Daily   Or  . bisacodyl  10 mg Rectal Daily  . chlorhexidine gluconate (MEDLINE KIT)  15 mL Mouth Rinse BID  . Chlorhexidine Gluconate Cloth  6 each Topical Daily  . [facility] Patient [facility] Family Education   Does not apply Once  . docusate sodium  200 mg Oral Daily  . folic acid  1 mg Oral Daily  . furosemide  40 mg Oral Daily  . guaiFENesin  600 mg Oral BID  . insulin aspart  0-15 Units Subcutaneous TID WC  . metoprolol tartrate  12.5 mg Oral BID   Or  . metoprolol tartrate  12.5 mg Per Tube BID  . mometasone-formoterol  2 puff Inhalation BID  . pantoprazole  40 mg Oral Daily  . potassium chloride  20 mEq Oral Daily  . sodium chloride flush  3 mL Intravenous Q12H  . thiamine  100 mg Oral Daily   Or  . thiamine  100 mg Intravenous Daily   Continuous Infusions: . sodium chloride     PRN Meds: sodium chloride, alum & mag hydroxide-simeth, diphenhydrAMINE, levalbuterol, magnesium hydroxide, metoprolol tartrate, ondansetron (ZOFRAN) IV, oxyCODONE, sodium chloride flush, traMADol, zolpidem   Vital Signs    Vitals:   02/20/20 2319 02/21/20 0500 02/21/20 0609 02/21/20 0822  BP: 125/72 123/75  120/81  Pulse: 72 79  91  Resp: 15   15  Temp: 98.8 F (37.1 C) 98.2 F (36.8 C)  98.7 F (37.1 C)  TempSrc: Oral Oral  Oral  SpO2: 94% 100%  99%  Weight:   87.5 kg   Height:        Intake/Output Summary (Last 24 hours) at 02/21/2020 0836 Last data filed at 02/20/2020 1300 Gross per 24 hour    Intake 120 ml  Output --  Net 120 ml   Filed Weights   02/19/20 0500 02/20/20 0429 02/21/20 0609  Weight: 90.4 kg 90.4 kg 87.5 kg    Physical Exam   General: Well developed, well nourished, NAD Neck: Negative for carotid bruits. No JVD Lungs:Clear to ausculation bilaterally. No wheezes, rales, or rhonchi. Breathing is unlabored. Cardiovascular: RRR with S1 S2. No murmurs Abdomen: Soft, non-tender, non-distended. No obvious abdominal masses. Extremities: 2+BLE R>L edema. Radial pulses 2+ bilaterally Neuro: Alert and oriented. No focal deficits. No facial asymmetry. MAE spontaneously. Psych: Responds to questions appropriately with normal affect.    Labs    Chemistry Recent Labs  Lab 02/16/20 1220 02/17/20 0032 02/18/20 1710 02/19/20 0310 02/20/20 0453  NA  --    < > 130* 132* 134*  K  --    < > 3.7 4.0 3.9  CL  --    < > 102 102 100  CO2  --    < > 22 23 25   GLUCOSE  --    < > 222* 122* 142*  BUN  --    < >  11 10 8   CREATININE  --    < > 0.76 0.68 0.80  CALCIUM  --    < > 7.0* 7.4* 7.7*  PROT 6.8  --   --   --   --   ALBUMIN 3.8  --   --   --   --   AST 33  --   --   --   --   ALT 29  --   --   --   --   ALKPHOS 54  --   --   --   --   BILITOT 0.8  --   --   --   --   GFRNONAA  --    < > >60 >60 >60  GFRAA  --    < > >60 >60 >60  ANIONGAP  --    < > 6 7 9    < > = values in this interval not displayed.     Hematology Recent Labs  Lab 02/18/20 1710 02/19/20 0310 02/20/20 0453  WBC 8.9 8.6 9.0  RBC 3.07* 3.02* 2.91*  HGB 10.7* 10.2* 10.0*  HCT 31.3* 30.7* 29.7*  MCV 102.0* 101.7* 102.1*  MCH 34.9* 33.8 34.4*  MCHC 34.2 33.2 33.7  RDW 13.3 13.4 13.1  PLT 74* 74* 88*   Lipid Panel     Component Value Date/Time   CHOL 108 02/20/2020 1155   TRIG 109 02/20/2020 1155   HDL 30 (L) 02/20/2020 1155   CHOLHDL 3.6 02/20/2020 1155   VLDL 22 02/20/2020 1155   LDLCALC 56 02/20/2020 1155    Cardiac EnzymesNo results for input(s): TROPONINI in the last 168  hours. No results for input(s): TROPIPOC in the last 168 hours.   BNPNo results for input(s): BNP, PROBNP in the last 168 hours.   DDimer No results for input(s): DDIMER in the last 168 hours.   Radiology    DG Chest 2 View  Result Date: 02/20/2020 CLINICAL DATA:  59 year old male with atelectasis. EXAM: CHEST - 2 VIEW COMPARISON:  Chest x-ray 02/19/2020. FINDINGS: Previously noted right IJ Cordis and right-sided chest tube have both been removed. No appreciable right-sided pneumothorax. Lung volumes are normal. There are some bibasilar opacities (right greater than left) which are favored to reflect areas of resolving postoperative subsegmental atelectasis. Small bilateral pleural effusions (right greater than left). No definite consolidative airspace disease. No evidence of pulmonary edema. Heart size is normal. Upper mediastinal contours are within normal limits. Status post median sternotomy for CABG. IMPRESSION: 1. Postoperative changes and support apparatus, as above. 2. Resolving bibasilar subsegmental atelectasis and small bilateral pleural effusions (right greater than left). Electronically Signed   By: Vinnie Langton M.D.   On: 02/20/2020 08:11   Telemetry    02/21/20 NSR - Personally Reviewed  ECG    No new tracing as of 02/21/20- Personally Reviewed  Cardiac Studies   LHC 02/16/20:   Ramus lesion is 90% stenosed.  Prox Cx lesion is 60% stenosed.  Ost LM to Mid LM lesion is 25% stenosed.  Mid LAD to Dist LAD lesion is 90% stenosed.  Mid LAD lesion is 90% stenosed.  Ost LAD to Prox LAD lesion is 70% stenosed.  Prox RCA to Mid RCA lesion is 65% stenosed.  There is mild to moderate left ventricular systolic dysfunction.  LV end diastolic pressure is mildly elevated.  The left ventricular ejection fraction is 45-50% by visual estimate.  Echo 02/16/20:  1. Left ventricular ejection fraction, by estimation,  is 55 to 60%. The  left ventricle has normal function.  The left ventricle has no regional  wall motion abnormalities. There is mild concentric left ventricular  hypertrophy. Left ventricular diastolic  parameters are consistent with Grade II diastolic dysfunction  (pseudonormalization).  2. Right ventricular systolic function is normal. The right ventricular  size is normal.  3. Left atrial size was moderately dilated.  4. The mitral valve is normal in structure. Trivial mitral valve  regurgitation. No evidence of mitral stenosis.  5. The aortic valve is tricuspid. Aortic valve regurgitation is not  visualized. No aortic stenosis is present.  6. The inferior vena cava is dilated in size with >50% respiratory  variability, suggesting right atrial pressure of 8 mmHg.   Patient Profile     60 y.o. 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 Both vessels are calcified. The circumflex is nondominant and has a noncritical lesion in the mid AV groove. The RCA is large dominant vessel with a 60% smooth lesion in the mid vessel. -Underwent CABG with LIMA to LAD, SVG to ramus intermediate, SVG to PDA on 02/17/20 -Echo with LVEF at 55-60% with NRWMA and G2DD -Continue ASA, high intensity statin, metoprolol tartrate 12.1m PO BID -LDL found to be controlled at 56 on 02/20/20   2. Tobacco use: -Cessation strongly encouraged   3. HTN: -Stable, 122/74>119/67>121/79 -Metoprolol 12.562mPO BID   4. Hx of ETOH use: -No w/d symptoms today  -Cessation strongly encouraged  -Wife reports daily alcohol use>>could be up to one gallon per day -D/c with ativan    5. DM2: -HbA1c, 6.8 -Consider adding SGLT2 inhibitor >>needs close follow up with PCP in the OP  setting  -SSI while inpatient status -DM coordinator for education    6. LE edema: -BLE on exam today  -Plan for d/c on Lasix per TCTS    Signed, JiKathyrn DrownP-C [facility] Pager: [phone]/15/2021, 8:36 AM     Patient seen and examined. Agree with assessment and plan. Feels well. Anxious to go home today. No chest pain, s/p CABG. LDL now 56 on high potency statin. Discussed lifestyle modification with smoking cessation. Cardiac Rehab. BP controlled. Decreased BS without wheezing contributed by significant tobacco history. F/U with Dr. RoHarrington Challenger  ThTroy SineMD, FAM[facility]/15/2021 9:15 AM    For questions or updates, please contact   Please consult [email or web address] for contact info under Cardiology/STEMI.

## 2020-02-21 NOTE — Progress Notes (Signed)
Pt discharged from unit. Medication/discharge instruction given, VVS  Ramandeep Arington K Bubba Vanbenschoten, RN   

## 2020-02-21 NOTE — Telephone Encounter (Signed)
Patient is scheduled for a TOC appointment per Judy Pimple on 03/13/20 at 8:45 AM with Tereso Newcomer.

## 2020-02-21 NOTE — Progress Notes (Addendum)
      [address]       [place] [address]             [phone]      4 Days Post-Op Procedure(s) (LRB): CORONARY ARTERY BYPASS GRAFTING (CABG) x Three, using left internal mammry artery and right leg greater saphenous vein harvested endoscopically (N/A) TRANSESOPHAGEAL ECHOCARDIOGRAM (TEE) (N/A) Subjective: Ready to go home this morning.   Objective: Vital signs in last 24 hours: Temp:  [98.2 F (36.8 C)-99 F (37.2 C)] 98.2 F (36.8 C) (06/15 0500) Pulse Rate:  [72-79] 79 (06/15 0500) Cardiac Rhythm: Normal sinus rhythm (06/15 0500) Resp:  [15-17] 15 (06/14 2319) BP: (110-145)/(62-75) 123/75 (06/15 0500) SpO2:  [93 %-100 %] 100 % (06/15 0500) Weight:  [87.5 kg] 87.5 kg (06/15 0609)     Intake/Output from previous day: 06/14 0701 - 06/15 0700 In: 240 [P.O.:240] Out: -  Intake/Output this shift: No intake/output data recorded.  General appearance: alert, cooperative and no distress Heart: regular rate and rhythm, S1, S2 normal, no murmur, click, rub or gallop Lungs: clear to auscultation bilaterally Abdomen: soft, non-tender; bowel sounds normal; no masses,  no organomegaly Extremities: extremities normal, atraumatic, no cyanosis or edema Wound: clean and dry  Lab Results: Recent Labs    02/19/20 0310 02/20/20 0453  WBC 8.6 9.0  HGB 10.2* 10.0*  HCT 30.7* 29.7*  PLT 74* 88*   BMET:  Recent Labs    02/19/20 0310 02/20/20 0453  NA 132* 134*  K 4.0 3.9  CL 102 100  CO2 23 25  GLUCOSE 122* 142*  BUN 10 8  CREATININE 0.68 0.80  CALCIUM 7.4* 7.7*    PT/INR: No results for input(s): LABPROT, INR in the last 72 hours. ABG    Component Value Date/Time   PHART 7.398 02/18/2020 0235   HCO3 22.3 02/18/2020 0235   TCO2 23 02/18/2020 0235   ACIDBASEDEF 2.0 02/18/2020 0235   O2SAT 92.0 02/18/2020 0235   CBG (last 3)  Recent Labs    02/20/20 1616 02/20/20 2119 02/21/20 0611  GLUCAP 118* 166* 125*    Assessment/Plan: S/P  Procedure(s) (LRB): CORONARY ARTERY BYPASS GRAFTING (CABG) x Three, using left internal mammry artery and right leg greater saphenous vein harvested endoscopically (N/A) TRANSESOPHAGEAL ECHOCARDIOGRAM (TEE) (N/A)  1. CV-NSR in the 80s, BP well controlled. Continue asa, statin and metoprolol. 2. Pulm-tolerating room air with good oxygen saturation. CXR reviewed and stable 3. Renal-creatinine 0.80, electrolytes okay 4. H and H 10.0/29.7, expected acute blood loss anemia 5. Endo-blood glucose well controlled  Plan: Discharge today. Instructions reviewed and all questions answered. Will discharge on PRN ativan for alcohol abuse. Will need another week of lasix for fluid overload.    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  Will cover with oral antibiotics-Keflex and follow-up in office for wound check next week.  Discharge instructions reviewed with patient and wife.  patient examined and medical record reviewed,agree with above note. Kathlee Nations Trigt III 02/21/2020

## 2020-02-22 NOTE — Telephone Encounter (Signed)
**Note De-Identified  Obfuscation** TCTS TCM Call-1st attempt. No answer so I left a message on the pts VM asking him to call Larita Fife at [facility] at [phone].

## 2020-02-23 NOTE — Telephone Encounter (Signed)
**Note De-Identified  Obfuscation** Patient contacted regarding discharge from [facility] on 02/21/2020.  Patient understands to follow up with provider Tereso Newcomer, PA-c on 03/13/2020 at 8:45 at [address] in [place], [place] [address]. And 02/29/2020 at 11:00 at [facility] at [address] [place], [place] [address]   Patient understands discharge instructions? Yes Patient understands medications and regiment? Yes Patient understands to bring all medications to this visit? Yes  Ask patient:  Are you enrolled in My Chart: The pt states that he and his girlfriend will be working on that the 1st chance they get as they have had a lot going on. He does not feel that they will have any issues/difficulties signing up.   Postop Surgical Patients:                What is your wound status? Per pt "looks good"     Any signs/ symptoms of infection (Temp, redness/ red streaks, swelling, purulent drainage, foul odor or smell)? Per pt "No"  .             Please do not place any creams/ lotions/ or antibiotic ointment on any surgical incisions/ wounds without physician approval: Pt advised and he verbalized understanding.  .             Do you have any questions about your medications? Per pt "No"       All medications (except pain medications) are to be filled by your Cardiologist AFTER your first post op       appointment with them: The pt is advised and he verbalized understanding.    Are you taking your pain medication? Yes. He states that he is taking both Oxycodone and Tylenol as prescribed.  .             How is your pain controlled? Per pt "ok"   Pain level? On a scale of 1 to 10 with 1 being the least he rates his pain at 4.  .             If you require a refill on pain medications, know that the same medication/ amount may not be prescribed or a refill may not be given:  He has been advised and he verbalized understanding. Please contact your pharmacy for refill requests:  He has  been advised and he verbalized understanding.   .             Do you have help at home with ADL's? The pt states that his girlfriend is there with him daily.  If you have home health, have you been contacted or seen by the agency? None order  .             Please refer to your Pre/post surgery booklet, there is a lot of useful information in it that may answer any questions you may have: He has been advised and he verbalized understanding.   .             Please note that it is ok to remove your surgical dressing, shower (soap/ water), and pat the incision dry: He has been advised and he verbalized understanding.

## 2020-02-24 ENCOUNTER — Telehealth: Payer: Self-pay

## 2020-02-24 ENCOUNTER — Telehealth (HOSPITAL_COMMUNITY): Payer: Self-pay

## 2020-02-24 NOTE — Telephone Encounter (Signed)
Patient's family contacted the office concerned about patient's leg swelling.  She stated that he is taking Lasix 40 mg daily and "it does not seem to be working".  Patient is s/p CABG x3 with Dr. Donata Clay 02/17/2020 and discharged this Monday, June 14th.  Upon questioning, patient has not been elevating his legs when he is seated and the right leg is the swollen one.  Advised that the [facility] site leg may become more swollen then the other which is normal.  He can wear compression hose to help with the swelling and to make sure he has been weighing himself daily, which he just started today.  D/C weight 190 lbs., today's weight, 186 lbs.  Patient is still taking Lasix which advised to continue taking.  Patient stated that he is not short of breath. Patient does have a follow-up appointment with Dr. Donata Clay next week which patient is aware of.

## 2020-02-24 NOTE — Telephone Encounter (Signed)
Pt returned CR phone call and stated he is interested in our VCR program. Will contact him after his f/u appt.

## 2020-02-25 ENCOUNTER — Ambulatory Visit: Payer: Self-pay | Attending: Internal Medicine

## 2020-02-25 DIAGNOSIS — Z23 Encounter for immunization: Secondary | ICD-10-CM

## 2020-02-25 NOTE — Progress Notes (Signed)
° °  Covid-19 Vaccination Clinic  Name:  Karl Garcia    MRN: [id number] DOB: August 10, 1961  02/25/2020  [name] was observed post Covid-19 immunization for 15 minutes without incident. He was provided with Vaccine Information Sheet and instruction to access the V-Safe system.   [name] was instructed to call 911 with any severe reactions post vaccine:  Difficulty breathing   Swelling of face and throat   A fast heartbeat   A bad rash all over body   Dizziness and weakness   Immunizations Administered    Name Date Dose VIS Date Route   JANSSEN COVID-19 VACCINE 02/25/2020  8:14 AM 0.5 mL 11/05/2019 Intramuscular   Manufacturer: [employer]   Lot: [id number]   NDC: 90903-014-99

## 2020-02-28 ENCOUNTER — Ambulatory Visit: Payer: Self-pay | Admitting: Cardiothoracic Surgery

## 2020-03-01 ENCOUNTER — Encounter (HOSPITAL_COMMUNITY): Payer: Self-pay | Admitting: *Deleted

## 2020-03-01 ENCOUNTER — Other Ambulatory Visit: Payer: Self-pay

## 2020-03-01 ENCOUNTER — Emergency Department (HOSPITAL_COMMUNITY)
Admission: EM | Admit: 2020-03-01 | Discharge: 2020-03-02 | Disposition: A | Discharge status: Home / Self Care | Payer: Self-pay | Attending: Emergency Medicine | Admitting: Emergency Medicine

## 2020-03-01 DIAGNOSIS — I252 Old myocardial infarction: Secondary | ICD-10-CM | POA: Insufficient documentation

## 2020-03-01 DIAGNOSIS — Z7982 Long term (current) use of aspirin: Secondary | ICD-10-CM | POA: Insufficient documentation

## 2020-03-01 DIAGNOSIS — F17210 Nicotine dependence, cigarettes, uncomplicated: Secondary | ICD-10-CM | POA: Insufficient documentation

## 2020-03-01 DIAGNOSIS — Z951 Presence of aortocoronary bypass graft: Secondary | ICD-10-CM | POA: Insufficient documentation

## 2020-03-01 DIAGNOSIS — Z79899 Other long term (current) drug therapy: Secondary | ICD-10-CM | POA: Insufficient documentation

## 2020-03-01 DIAGNOSIS — Z20822 Contact with and (suspected) exposure to covid-19: Secondary | ICD-10-CM | POA: Insufficient documentation

## 2020-03-01 DIAGNOSIS — F1010 Alcohol abuse, uncomplicated: Secondary | ICD-10-CM | POA: Insufficient documentation

## 2020-03-01 DIAGNOSIS — Z046 Encounter for general psychiatric examination, requested by authority: Secondary | ICD-10-CM | POA: Insufficient documentation

## 2020-03-01 DIAGNOSIS — Y908 Blood alcohol level of 240 mg/100 ml or more: Secondary | ICD-10-CM | POA: Insufficient documentation

## 2020-03-01 LAB — COMPREHENSIVE METABOLIC PANEL
ALT: 54 U/L — ABNORMAL HIGH (ref 0–44)
AST: 37 U/L (ref 15–41)
Albumin: 2.8 g/dL — ABNORMAL LOW (ref 3.5–5.0)
Alkaline Phosphatase: 64 U/L (ref 38–126)
Anion gap: 13 (ref 5–15)
BUN: 10 mg/dL (ref 8–23)
CO2: 22 mmol/L (ref 22–32)
Calcium: 8.7 mg/dL — ABNORMAL LOW (ref 8.9–10.3)
Chloride: 104 mmol/L (ref 98–111)
Creatinine, Ser: 0.89 mg/dL (ref 0.61–1.24)
GFR calc Af Amer: >60 mL/min (ref >60)
GFR calc non Af Amer: >60 mL/min (ref >60)
Glucose, Bld: 114 mg/dL — ABNORMAL HIGH (ref 70–99)
Potassium: 3.5 mmol/L (ref 3.5–5.1)
Sodium: 139 mmol/L (ref 135–145)
Total Bilirubin: 0.5 mg/dL (ref 0.3–1.2)
Total Protein: 6.7 g/dL (ref 6.5–8.1)

## 2020-03-01 LAB — ETHANOL: Alcohol, Ethyl (B): 296 mg/dL — ABNORMAL HIGH (ref <10)

## 2020-03-01 LAB — ACETAMINOPHEN LEVEL: Acetaminophen (Tylenol), Serum: <10 ug/mL — ABNORMAL LOW (ref 10–30)

## 2020-03-01 LAB — CBC
HCT: 31.3 % — ABNORMAL LOW (ref 39.0–52.0)
Hemoglobin: 10.1 g/dL — ABNORMAL LOW (ref 13.0–17.0)
MCH: 33.7 pg (ref 26.0–34.0)
MCHC: 32.3 g/dL (ref 30.0–36.0)
MCV: 104.3 fL — ABNORMAL HIGH (ref 80.0–100.0)
Platelets: 541 10*3/uL — ABNORMAL HIGH (ref 150–400)
RBC: 3 MIL/uL — ABNORMAL LOW (ref 4.22–5.81)
RDW: 13.5 % (ref 11.5–15.5)
WBC: 12.5 10*3/uL — ABNORMAL HIGH (ref 4.0–10.5)
nRBC: 0 % (ref 0.0–0.2)

## 2020-03-01 LAB — RAPID URINE DRUG SCREEN, HOSP PERFORMED
Amphetamines: NOT DETECTED
Barbiturates: NOT DETECTED
Benzodiazepines: NOT DETECTED
Cocaine: NOT DETECTED
Opiates: NOT DETECTED
Tetrahydrocannabinol: NOT DETECTED

## 2020-03-01 LAB — SARS CORONAVIRUS 2 BY RT PCR (HOSPITAL ORDER, PERFORMED IN ~~LOC~~ HOSPITAL LAB): SARS Coronavirus 2: NEGATIVE

## 2020-03-01 LAB — SALICYLATE LEVEL: Salicylate Lvl: <7 mg/dL — ABNORMAL LOW (ref 7.0–30.0)

## 2020-03-01 MED ORDER — METOPROLOL TARTRATE 25 MG PO TABS
12.5000 mg | ORAL_TABLET | Freq: Two times a day (BID) | ORAL | Status: DC
  Administered 2020-03-01 – 2020-03-02 (×2): 12.5 mg via ORAL
  Filled 2020-03-01 (×2): qty 1

## 2020-03-01 MED ORDER — LORAZEPAM 1 MG PO TABS: 1.0000 mg | ORAL_TABLET | Freq: Two times a day (BID) | ORAL | Status: DC | PRN

## 2020-03-01 MED ORDER — CEPHALEXIN 250 MG PO CAPS
500.0000 mg | ORAL_CAPSULE | Freq: Three times a day (TID) | ORAL | Status: DC
  Administered 2020-03-01 – 2020-03-02 (×2): 500 mg via ORAL
  Filled 2020-03-01 (×2): qty 2

## 2020-03-01 MED ORDER — ATORVASTATIN CALCIUM 80 MG PO TABS
80.0000 mg | ORAL_TABLET | Freq: Every day | ORAL | Status: DC
  Administered 2020-03-01 – 2020-03-02 (×2): 80 mg via ORAL
  Filled 2020-03-01 (×2): qty 1

## 2020-03-01 MED ORDER — FUROSEMIDE 20 MG PO TABS
40.0000 mg | ORAL_TABLET | Freq: Every day | ORAL | Status: DC
  Administered 2020-03-01: 40 mg via ORAL
  Filled 2020-03-01: qty 2

## 2020-03-01 MED ORDER — POTASSIUM CHLORIDE CRYS ER 20 MEQ PO TBCR
20.0000 meq | EXTENDED_RELEASE_TABLET | Freq: Every day | ORAL | Status: DC
  Administered 2020-03-01 – 2020-03-02 (×2): 20 meq via ORAL
  Filled 2020-03-01 (×2): qty 1

## 2020-03-01 MED ORDER — ACETAMINOPHEN 500 MG PO TABS
1000.0000 mg | ORAL_TABLET | Freq: Four times a day (QID) | ORAL | Status: DC
  Administered 2020-03-01 – 2020-03-02 (×3): 1000 mg via ORAL
  Filled 2020-03-01 (×3): qty 2

## 2020-03-01 MED ORDER — ASPIRIN EC 325 MG PO TBEC
325.0000 mg | DELAYED_RELEASE_TABLET | Freq: Every day | ORAL | Status: DC
  Administered 2020-03-01 – 2020-03-02 (×2): 325 mg via ORAL
  Filled 2020-03-01 (×2): qty 1

## 2020-03-01 MED ORDER — OXYCODONE HCL 5 MG PO TABS: 5.0000 mg | ORAL_TABLET | Freq: Four times a day (QID) | ORAL | Status: DC | PRN

## 2020-03-01 MED ORDER — THIAMINE HCL 100 MG PO TABS
100.0000 mg | ORAL_TABLET | Freq: Every day | ORAL | Status: DC
  Administered 2020-03-01 – 2020-03-02 (×2): 100 mg via ORAL
  Filled 2020-03-01 (×2): qty 1

## 2020-03-01 NOTE — ED Notes (Signed)
Belongings inventoried and placed in locker 5. Valuables, including $2470 in cash which was counted in front of the patient, inventoried and given to security.
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
## 2020-03-02 ENCOUNTER — Ambulatory Visit: Payer: Self-pay | Admitting: Cardiothoracic Surgery

## 2020-03-02 ENCOUNTER — Emergency Department (HOSPITAL_COMMUNITY): Payer: Self-pay

## 2020-03-02 DIAGNOSIS — F1010 Alcohol abuse, uncomplicated: Secondary | ICD-10-CM

## 2020-03-02 MED ORDER — FUROSEMIDE 20 MG PO TABS
40.0000 mg | ORAL_TABLET | Freq: Every day | ORAL | Status: DC
  Administered 2020-03-02: 40 mg via ORAL
  Filled 2020-03-02: qty 2

## 2020-03-02 NOTE — ED Notes (Signed)
All patients belongings including clothing, phone, and $2,470.00 in assorted bills were returned to this patient.

## 2020-03-02 NOTE — BH Assessment (Addendum)
Will, RN to fax pt's IVC paperwork. Once IVC paperwork is received and reviewed TTS will engage pt in assessment.    Redmond Pulling, MS, [facility], [facility] Triage Specialist [phone]
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
## 2020-03-02 NOTE — ED Notes (Signed)
Belongings returned from [employer] to this pt ($2,470.00, clothing, phone, etc.)

## 2020-03-02 NOTE — Discharge Instructions (Signed)
Please cut back/cease drinking.  Please read the attached information and use the resources here.

## 2020-03-02 NOTE — Progress Notes (Signed)
  Subjective: Patient examined and CXR, Labs reviewed In ED for emotional issues influenced by Etoh He denies angina, CHF symptoms Incisions mare healing well CXR today reviewed- clear NSR Objective: Vital signs in last 24 hours: Temp:  [97.6 F (36.4 C)-98.6 F (37 C)] 97.6 F (36.4 C) (06/25 0546) Pulse Rate:  [79-92] 88 (06/25 0546) Resp:  [16] 16 (06/25 0546) BP: (111-153)/(62-72) 153/72 (06/25 0546) SpO2:  [97 %-100 %] 97 % (06/25 0546) Weight:  [81.6 kg] 81.6 kg (06/25 0546)  Hemodynamic parameters for last 24 hours:    Intake/Output from previous day: 06/24 0701 - 06/25 0700 In: -  Out: 1100 [Urine:1100] Intake/Output this shift: No intake/output data recorded.       Exam    General- alert and comfortable. Incisions clean    Neck- no JVD, no cervical adenopathy palpable, no carotid bruit   Lungs- clear without rales, wheezes   Cor- regular rate and rhythm, no murmur , gallop   Abdomen- soft, non-tender   Extremities - warm, non-tender, minimal edema   Neuro- oriented, appropriate, no focal weakness   Lab Results: Recent Labs    03/01/20 1800  WBC 12.5*  HGB 10.1*  HCT 31.3*  PLT 541*   BMET:  Recent Labs    03/01/20 1800  NA 139  K 3.5  CL 104  CO2 22  GLUCOSE 114*  BUN 10  CREATININE 0.89  CALCIUM 8.7*    PT/INR: No results for input(s): LABPROT, INR in the last 72 hours. ABG    Component Value Date/Time   PHART 7.398 02/18/2020 0235   HCO3 22.3 02/18/2020 0235   TCO2 23 02/18/2020 0235   ACIDBASEDEF 2.0 02/18/2020 0235   O2SAT 92.0 02/18/2020 0235   CBG (last 3)  No results for input(s): GLUCAP in the last 72 hours.  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

## 2020-03-02 NOTE — ED Notes (Signed)
Patient verbalizes understanding of discharge instructions. Opportunity for questioning and answers were provided. Armband removed by staff, pt discharged from ED ambulatory to home.  

## 2020-03-02 NOTE — ED Notes (Signed)
IVC Rescinded per Secretary and EDP

## 2020-03-02 NOTE — BHH Counselor (Signed)
Clinician attempted to contact pt's IVC petitioner/girlfriend Dan Europe, [phone]) to gather collateral information however clinician left a HIPPA compliant voice message.   Clinician has not received a call back.   Redmond Pulling, MS, [facility], [facility] Triage Specialist [phone]
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
## 2020-03-02 NOTE — Care Management (Signed)
CM provided patient with OP ETOH treatment resources, and information for OP Behavioral Health follow up with [facility] placed on AVS.

## 2020-03-02 NOTE — ED Notes (Signed)
Per TTS pt to be reevaluated in AM

## 2020-03-02 NOTE — ED Notes (Signed)
Lunch Tray Ordered @ 1048. °

## 2020-03-12 NOTE — Progress Notes (Signed)
Cardiology Office Note:    Date:  03/13/2020   ID:  Karl Garcia, DOB February 24, 1961, MRN [id number]  PCP:  Patient, No Pcp Per  Cardiologist:  Dietrich Pates, MD  Electrophysiologist:  None   Referring MD: No ref. provider found   Chief Complaint:  Hospitalization Follow-up (s/p MI >> s/p CABG)    Patient Profile:    Karl Garcia is a 59 y.o. male with:   Coronary artery disease  S/p NSTEMI >> s/p CABG  Hypertension   Hyperlipidemia   Diabetes mellitus   Peripheral Arterial Disease    Tobacco abuse  Alcohol abuse    Prior CV studies: Pre-CABG Dopplers 02/16/2020 Bilateral ICA 1-39; left subclavian stenosis ABIs:  R 0.64; L 0.86  Echocardiogram 02/16/2020 EF 55-60, normal wall motion, mild LVH, GRII DD, normal RV SF, moderate LAE, trivial MR  Cardiac catheterization 02/16/2020 LM ostial 25 LAD ostial 70, mid 90, distal 90 RI 90 LCx proximal 60 RCA proximal 65-45-50  History of Present Illness:    Karl Garcia was admitted 6/10-6/15 with a NSTEMI.  Cardiac catheterization demonstrated 3v CAD with culprit being a highly diseased LAD which is not amenable to PCI.  He underwent CABG with Dr. Donata Clay (L-LAD, S-RI, S-PDA).  His post op course was fairly uneventful.  Of note, his pre CABG dopplers did show evidence of L subclavian stenosis and mod R and mild L lower extremity arterial disease.  His A1C was 6.8 and OP follow up with PCP was recommended.  He returns for follow up.  He is here today with his girlfriend.  Since discharge, he feels that he has been doing well.  He still has some chest soreness but this is improving.  His right leg remains swollen.  He ran out of furosemide about a week or so ago.  His swelling gets worse throughout the day.  He has not really had significant shortness of breath or orthopnea.  He has been trying to walk as much as possible.  He does note some congestion.  He has not had fevers.  His appetite is good.  He continues to smoke and  drink alcohol.  Past Medical History:  Diagnosis Date   ETOH abuse    ETOH abuse    Tobacco abuse     Current Medications: Current Meds  Medication Sig   acetaminophen (TYLENOL) 500 MG tablet Take 2 tablets (1,000 mg total) by mouth every 6 (six) hours.   aspirin 81 MG tablet Take 1 tablet (81 mg total) by mouth daily.   atorvastatin (LIPITOR) 80 MG tablet Take 1 tablet (80 mg total) by mouth daily.   furosemide (LASIX) 40 MG tablet Take 1 tablet (40 mg total) by mouth daily.   LORazepam (ATIVAN) 1 MG tablet Take 1 tablet (1 mg total) by mouth 2 (two) times daily as needed for anxiety.   metoprolol tartrate (LOPRESSOR) 25 MG tablet Take 1 tablet (25 mg total) by mouth 2 (two) times daily.   mometasone-formoterol (DULERA) 100-5 MCG/ACT AERO Inhale 2 puffs into the lungs 2 (two) times daily.   potassium chloride SA (KLOR-CON) 20 MEQ tablet Take 1 tablet (20 mEq total) by mouth daily.   [DISCONTINUED] aspirin 325 MG EC tablet Take 1 tablet (325 mg total) by mouth daily.   [DISCONTINUED] furosemide (LASIX) 40 MG tablet Take 1 tablet (40 mg total) by mouth daily.   [DISCONTINUED] metoprolol tartrate (LOPRESSOR) 25 MG tablet Take 0.5 tablets (12.5 mg total) by mouth 2 (two)  times daily.   [DISCONTINUED] potassium chloride SA (KLOR-CON) 20 MEQ tablet Take 1 tablet (20 mEq total) by mouth daily.     Allergies:   Morphine and related   Social History   Tobacco Use   Smoking status: Current Every Day Smoker    Packs/day: 1.50   Smokeless tobacco: Never Used  Vaping Use   Vaping Use: Never used  Substance Use Topics   Alcohol use: Yes    Comment: 1 pint of liquor per day    Drug use: Never     Family Hx: The patient's family history includes Healthy in his brother; Heart attack in his father and mother; Heart failure in his father.  Review of Systems  Constitutional: Negative for decreased appetite and fever.  Respiratory: Positive for cough.     Gastrointestinal: Negative for hematochezia and melena.  Genitourinary: Negative for hematuria.     EKGs/Labs/Other Test Reviewed:    EKG:  EKG is   ordered today.  The ekg ordered today demonstrates normal sinus rhythm, heart rate 97, normal axis, nonspecific ST-T wave changes, anteroseptal Q waves, QTC 408  Recent Labs: 02/17/2020: TSH 1.563 02/18/2020: Magnesium 2.1 03/01/2020: ALT 54; BUN 10; Creatinine, Ser 0.89; Hemoglobin 10.1; Platelets 541; Potassium 3.5; Sodium 139   Recent Lipid Panel Lab Results  Component Value Date/Time   CHOL 108 02/20/2020 11:55 AM   TRIG 109 02/20/2020 11:55 AM   HDL 30 (L) 02/20/2020 11:55 AM   CHOLHDL 3.6 02/20/2020 11:55 AM   LDLCALC 56 02/20/2020 11:55 AM    Physical Exam:    VS:  BP (!) 160/58    Pulse 97    Ht 5\' 11"  (1.803 m)    Wt 176 lb 6.4 oz (80 kg)    SpO2 95%    BMI 24.60 kg/m     Wt Readings from Last 3 Encounters:  03/13/20 176 lb 6.4 oz (80 kg)  03/02/20 180 lb (81.6 kg)  02/21/20 192 lb 12.8 oz (87.5 kg)     Constitutional:      Appearance: Healthy appearance. Not in distress.  Neck:     Thyroid: No thyromegaly.     Vascular: JVD normal.  Pulmonary:     Breath sounds: No wheezing. No rales.     Comments: Decreased breath sounds Cardiovascular:     PMI at left midclavicular line. Normal rate. Regular rhythm. Normal S1. Normal S2.     Murmurs: There is no murmur.  Edema:    Pretibial: trace edema of the left pretibial area and 1+ edema of the right pretibial area. Abdominal:     Palpations: Abdomen is soft.  Skin:    General: Skin is warm and dry.  Neurological:     General: No focal deficit present.     Mental Status: Alert and oriented to person, place and time.     Cranial Nerves: Cranial nerves are intact.      ASSESSMENT & PLAN:    1. NSTEMI (non-ST elevation myocardial infarction) (HCC) Status post recent nonestablished myocardial infarction followed by CABG.  He is currently progressing well since his  bypass surgery.  He still has some soreness but seems to be improving.  He has not really heard from cardiac rehabilitation yet.  He would like to go.  I will place a referral for him.  He still has some evidence of volume excess.  Some of his swelling in his right leg is also related to vein harvesting.  His blood pressure is  above target.  I will resume his furosemide at 40 mg daily along with potassium 20 mEq daily.  As he did have acute coronary syndrome prior to his bypass, he should be on dual antiplatelet therapy.  Therefore, I will change his aspirin 81 mg daily and add clopidogrel 75 mg daily.  Continue atorvastatin, beta-blocker.  Follow-up with Dr. Tenny Craw or me in 3 to 4 weeks.  -Change aspirin 325 to 81 mg daily  -Add Clopidogrel 75 mg daily  -Refer to cardiac rehab  -Follow-up 3-4 weeks  2. PAD (peripheral artery disease) (HCC) He did have evidence of moderate right and mild left lower extremity arterial disease on pre-CABG Dopplers.  He does note some symptoms of claudication as well.  I will reach out to Dr. Allyson Sabal who did his cardiac catheterization in the hospital as well as Dr. Donata Clay to determine follow-up for his PAD.  Continue statin, aspirin.  3. Subclavian arterial stenosis (HCC) He had evidence of left subclavian stenosis on pre-CABG Dopplers.  He does not really have symptoms of subclavian steal.  As noted above, I will reach out to Dr. Allyson Sabal and Dr. Donata Clay to determine follow up.    4. Type 2 diabetes mellitus without complication, without long-term current use of insulin (HCC) Hemoglobin A1c in the hospital was 6.8.  He needs follow-up with primary care.  He does not have a PCP.  We will provide him with information for PCPs in the area.  5. Essential hypertension Blood pressure above target.  Resume furosemide and increase metoprolol succinate to 25 mg twice daily.  Dr. Tenny Craw or I will see him back in 3 to 4 weeks.  If his blood pressures remains elevated and his heart  rate is better, consider adding ACE inhibitor therapy.  6. Mixed hyperlipidemia Continue high intensity statin therapy.  At follow-up, I will schedule follow-up fasting lipids and LFTs.    Dispo:  Return in about 4 weeks (around 04/10/2020) for Close Follow Up, w/ Dr. Tenny Craw, or Tereso Newcomer, PA-C, in person.   Medication Adjustments/Labs and Tests Ordered: Current medicines are reviewed at length with the patient today.  Concerns regarding medicines are outlined above.  Tests Ordered: Orders Placed This Encounter  Procedures   Comprehensive metabolic panel   AMB referral to cardiac rehabilitation   EKG 12-Lead   Medication Changes: Meds ordered this encounter  Medications   clopidogrel (PLAVIX) 75 MG tablet    Sig: Take 1 tablet (75 mg total) by mouth daily.    Dispense:  90 tablet    Refill:  2   aspirin 81 MG tablet    Sig: Take 1 tablet (81 mg total) by mouth daily.   furosemide (LASIX) 40 MG tablet    Sig: Take 1 tablet (40 mg total) by mouth daily.    Dispense:  90 tablet    Refill:  2   potassium chloride SA (KLOR-CON) 20 MEQ tablet    Sig: Take 1 tablet (20 mEq total) by mouth daily.    Dispense:  90 tablet    Refill:  2   metoprolol tartrate (LOPRESSOR) 25 MG tablet    Sig: Take 1 tablet (25 mg total) by mouth 2 (two) times daily.    Dispense:  180 tablet    Refill:  2    Signed, Tereso Newcomer, PA-C  03/13/2020 10:11 AM    [facility] [address] [place], [place], [place]  [address] Phone: [phone]; Fax: [phone]

## 2020-03-13 ENCOUNTER — Encounter: Payer: Self-pay | Admitting: Physician Assistant

## 2020-03-13 ENCOUNTER — Ambulatory Visit (INDEPENDENT_AMBULATORY_CARE_PROVIDER_SITE_OTHER): Payer: Self-pay | Admitting: Physician Assistant

## 2020-03-13 ENCOUNTER — Other Ambulatory Visit: Payer: Self-pay

## 2020-03-13 VITALS — BP 160/58 | HR 97 | Ht 71.0 in | Wt 176.4 lb

## 2020-03-13 DIAGNOSIS — E119 Type 2 diabetes mellitus without complications: Secondary | ICD-10-CM

## 2020-03-13 DIAGNOSIS — I10 Essential (primary) hypertension: Secondary | ICD-10-CM

## 2020-03-13 DIAGNOSIS — I214 Non-ST elevation (NSTEMI) myocardial infarction: Secondary | ICD-10-CM

## 2020-03-13 DIAGNOSIS — I739 Peripheral vascular disease, unspecified: Secondary | ICD-10-CM

## 2020-03-13 DIAGNOSIS — I771 Stricture of artery: Secondary | ICD-10-CM

## 2020-03-13 DIAGNOSIS — E782 Mixed hyperlipidemia: Secondary | ICD-10-CM

## 2020-03-13 MED ORDER — ASPIRIN EC 81 MG PO TBEC: 81.0000 mg | DELAYED_RELEASE_TABLET | Freq: Every day | ORAL | Status: DC

## 2020-03-13 MED ORDER — FUROSEMIDE 40 MG PO TABS: 40.0000 mg | ORAL_TABLET | Freq: Every day | ORAL | 2 refills | Status: DC

## 2020-03-13 MED ORDER — POTASSIUM CHLORIDE CRYS ER 20 MEQ PO TBCR: 20.0000 meq | EXTENDED_RELEASE_TABLET | Freq: Every day | ORAL | 2 refills | Status: DC

## 2020-03-13 MED ORDER — CLOPIDOGREL BISULFATE 75 MG PO TABS
75.0000 mg | ORAL_TABLET | Freq: Every day | ORAL | 2 refills | Status: DC
Start: 2020-03-13 — End: 2022-12-03

## 2020-03-13 MED ORDER — METOPROLOL TARTRATE 25 MG PO TABS: 25.0000 mg | ORAL_TABLET | Freq: Two times a day (BID) | ORAL | 2 refills | Status: DC

## 2020-03-13 NOTE — Patient Instructions (Addendum)
Medication Instructions:   Your physician has recommended you make the following change in your medication:   1) Decrease Aspirin to 81 mg, 1 tablet by mouth once a day 2) Start Plavix 75 mg, 1 tablet by mouth once a day 3) Increase Metoprolol to 25 mg, 1 tablet by mouth twice a day  *If you need a refill on your cardiac medications before your next appointment, please call your pharmacy*  Lab Work:  Your physician recommends that you return for lab work in 1 week on 03/21/20  Testing/Procedures:  None ordered today  Follow-Up:  On 04/10/20 at 11:15AM with Tereso Newcomer, PA-C

## 2020-03-14 ENCOUNTER — Other Ambulatory Visit: Payer: Self-pay | Admitting: Surgical

## 2020-03-14 ENCOUNTER — Ambulatory Visit: Payer: Self-pay | Admitting: Physician Assistant

## 2020-03-14 ENCOUNTER — Other Ambulatory Visit: Payer: Self-pay | Admitting: Cardiothoracic Surgery

## 2020-03-14 ENCOUNTER — Ambulatory Visit (INDEPENDENT_AMBULATORY_CARE_PROVIDER_SITE_OTHER): Payer: Self-pay | Admitting: *Deleted

## 2020-03-14 ENCOUNTER — Ambulatory Visit: Payer: Self-pay | Admitting: Cardiothoracic Surgery

## 2020-03-14 ENCOUNTER — Encounter: Payer: Self-pay | Admitting: Cardiothoracic Surgery

## 2020-03-14 VITALS — BP 128/78 | HR 75 | Temp 97.6°F | Resp 16 | Ht 71.0 in | Wt 176.0 lb

## 2020-03-14 DIAGNOSIS — Z951 Presence of aortocoronary bypass graft: Secondary | ICD-10-CM

## 2020-03-14 DIAGNOSIS — Z4889 Encounter for other specified surgical aftercare: Secondary | ICD-10-CM

## 2020-03-14 DIAGNOSIS — I214 Non-ST elevation (NSTEMI) myocardial infarction: Secondary | ICD-10-CM

## 2020-03-14 DIAGNOSIS — I2510 Atherosclerotic heart disease of native coronary artery without angina pectoris: Secondary | ICD-10-CM

## 2020-03-14 MED ORDER — TRAMADOL HCL 50 MG PO TABS: 50.0000 mg | ORAL_TABLET | Freq: Four times a day (QID) | ORAL | 0 refills | Status: AC | PRN

## 2020-03-14 NOTE — Progress Notes (Signed)
Karl Garcia was scheduled to see Dr. Donata Clay today for a wound check of his distal sternal incision post CABG 02/17/20 as instructed on discharge.  There was confusion regarding appointment times, Dr. Donata Clay had to leave early and as a result Mr. Hyams was not seen by him.  I observed his sternal incision as well as his chest tube sites and right leg EVH site.  All areas are healing very well without signs of infection.  There is no drainage or open areas.  He will be seen next week for his regularly scheduled post op visit with a chest xray.

## 2020-03-15 ENCOUNTER — Telehealth: Payer: Self-pay | Admitting: Physician Assistant

## 2020-03-15 NOTE — Telephone Encounter (Signed)
Please tell patient I am going to have him follow up with Dr. Allyson Sabal for Peripheral Arterial Disease and L subclavian artery stenosis. Please put refer to PV (Dr. Allyson Sabal). Thanks Tereso Newcomer, PA-C    03/15/2020 5:00 PM

## 2020-03-16 ENCOUNTER — Other Ambulatory Visit: Payer: Self-pay

## 2020-03-16 DIAGNOSIS — I739 Peripheral vascular disease, unspecified: Secondary | ICD-10-CM

## 2020-03-16 DIAGNOSIS — I771 Stricture of artery: Secondary | ICD-10-CM

## 2020-03-16 NOTE — Telephone Encounter (Signed)
I called and spoke with patient, he is aware referral to Dr. Allyson Sabal will be started for Peripheral Arterial Disease and L subclavian artery stenosis. Referral in.

## 2020-03-20 ENCOUNTER — Telehealth (HOSPITAL_COMMUNITY): Payer: Self-pay

## 2020-03-20 ENCOUNTER — Other Ambulatory Visit: Payer: Self-pay

## 2020-03-20 NOTE — Telephone Encounter (Signed)
Telephone call to reach out to patient regarding recent PV Navigator referral. Patient reports he is too busy to talk right now and hung phone up. I will attempt to call patient at a later time.  Hilma Favors RN BSN CWS [facility] Navigator [place]

## 2020-03-21 ENCOUNTER — Other Ambulatory Visit: Payer: Self-pay | Admitting: *Deleted

## 2020-03-21 ENCOUNTER — Ambulatory Visit
Admission: RE | Admit: 2020-03-21 | Discharge: 2020-03-21 | Disposition: A | Discharge status: Home / Self Care | Payer: Self-pay | Source: Ambulatory Visit | Attending: Cardiothoracic Surgery | Admitting: Cardiothoracic Surgery

## 2020-03-21 ENCOUNTER — Ambulatory Visit (INDEPENDENT_AMBULATORY_CARE_PROVIDER_SITE_OTHER): Payer: Self-pay | Admitting: Cardiothoracic Surgery

## 2020-03-21 ENCOUNTER — Other Ambulatory Visit: Payer: Self-pay

## 2020-03-21 ENCOUNTER — Encounter: Payer: Self-pay | Admitting: Cardiothoracic Surgery

## 2020-03-21 VITALS — BP 118/81 | HR 79 | Temp 98.2°F | Resp 20 | Ht 71.0 in | Wt 172.0 lb

## 2020-03-21 DIAGNOSIS — I2510 Atherosclerotic heart disease of native coronary artery without angina pectoris: Secondary | ICD-10-CM

## 2020-03-21 DIAGNOSIS — I10 Essential (primary) hypertension: Secondary | ICD-10-CM

## 2020-03-21 DIAGNOSIS — E119 Type 2 diabetes mellitus without complications: Secondary | ICD-10-CM

## 2020-03-21 DIAGNOSIS — I214 Non-ST elevation (NSTEMI) myocardial infarction: Secondary | ICD-10-CM

## 2020-03-21 DIAGNOSIS — I739 Peripheral vascular disease, unspecified: Secondary | ICD-10-CM

## 2020-03-21 DIAGNOSIS — Z09 Encounter for follow-up examination after completed treatment for conditions other than malignant neoplasm: Secondary | ICD-10-CM | POA: Insufficient documentation

## 2020-03-21 DIAGNOSIS — Z951 Presence of aortocoronary bypass graft: Secondary | ICD-10-CM

## 2020-03-21 DIAGNOSIS — I771 Stricture of artery: Secondary | ICD-10-CM

## 2020-03-21 DIAGNOSIS — E782 Mixed hyperlipidemia: Secondary | ICD-10-CM

## 2020-03-21 LAB — COMPREHENSIVE METABOLIC PANEL
ALT: 20 IU/L (ref 0–44)
AST: 25 IU/L (ref 0–40)
Albumin/Globulin Ratio: 1.4 (ref 1.2–2.2)
Albumin: 4.2 g/dL (ref 3.8–4.8)
Alkaline Phosphatase: 114 IU/L (ref 48–121)
BUN/Creatinine Ratio: 21 (ref 10–24)
BUN: 14 mg/dL (ref 8–27)
Bilirubin Total: 0.6 mg/dL (ref 0.0–1.2)
CO2: 23 mmol/L (ref 20–29)
Calcium: 9.3 mg/dL (ref 8.6–10.2)
Chloride: 98 mmol/L (ref 96–106)
Creatinine, Ser: 0.66 mg/dL — ABNORMAL LOW (ref 0.76–1.27)
GFR calc Af Amer: 121 mL/min/{1.73_m2} (ref >59)
GFR calc non Af Amer: 104 mL/min/{1.73_m2} (ref >59)
Globulin, Total: 3 g/dL (ref 1.5–4.5)
Glucose: 127 mg/dL — ABNORMAL HIGH (ref 65–99)
Potassium: 4.1 mmol/L (ref 3.5–5.2)
Sodium: 140 mmol/L (ref 134–144)
Total Protein: 7.2 g/dL (ref 6.0–8.5)

## 2020-03-21 NOTE — Progress Notes (Signed)
PCP is Karl Garcia, No Pcp Per Referring Provider is Pricilla Riffle, MD  Chief Complaint  Karl Garcia presents with  . Routine Post Op    f/u from surgery  with CXR s/p CABG x 3 on 02/17/20    HPI: The Karl Garcia presents for 1 month follow-up with x-ray after urgent CABG x3 for non-STEMI..  Karl Garcia had a significant left subclavian stenosis so the left IMA was placed as a free graft.  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  Fortunately he did well postop and continues to progress.  He is reducing his cigarette smoking.  His surgical incisions are healing well.  Chest x-ray today demonstrates no significant effusions or edema with sternal wires intact.  He maintains a sinus rhythm and has had no recurrent angina.    Past Medical History:  Diagnosis Date  . ETOH abuse   . ETOH abuse   . Tobacco abuse     Past Surgical History:  Procedure Laterality Date  . CORONARY ARTERY BYPASS GRAFT N/A 02/17/2020   Procedure: CORONARY ARTERY BYPASS GRAFTING (CABG) x Three, using left internal mammry artery and right leg greater saphenous vein harvested endoscopically;  Surgeon: Kerin Perna, MD;  Location: [facility] OR;  Service: Open Heart Surgery;  Laterality: N/A;  . LEFT HEART CATH AND CORONARY ANGIOGRAPHY N/A 02/16/2020   Procedure: LEFT HEART CATH AND CORONARY ANGIOGRAPHY;  Surgeon: Runell Gess, MD;  Location: [facility] INVASIVE CV LAB;  Service: Cardiovascular;  Laterality: N/A;  . TEE WITHOUT CARDIOVERSION N/A 02/17/2020   Procedure: TRANSESOPHAGEAL ECHOCARDIOGRAM (TEE);  Surgeon: Donata Clay, Theron Arista, MD;  Location: [facility] OR;  Service: Open Heart Surgery;  Laterality: N/A;  . TONSILECTOMY, ADENOIDECTOMY, BILATERAL MYRINGOTOMY AND TUBES     unsure of all of that but + tonsilectomy    Family History  Problem Relation Age of Onset  . Heart attack Mother   . Heart attack Father   . Heart failure Father   . Healthy Brother     Social History Social History   Tobacco Use  . Smoking status:  Current Every Day Smoker    Packs/day: 1.50  . Smokeless tobacco: Never Used  Vaping Use  . Vaping Use: Never used  Substance Use Topics  . Alcohol use: Yes    Comment: 1 pint of liquor per day   . Drug use: Never    Current Outpatient Medications  Medication Sig Dispense Refill  . acetaminophen (TYLENOL) 500 MG tablet Take 2 tablets (1,000 mg total) by mouth every 6 (six) hours. 30 tablet 0  . aspirin 81 MG tablet Take 1 tablet (81 mg total) by mouth daily.    Marland Kitchen atorvastatin (LIPITOR) 80 MG tablet Take 1 tablet (80 mg total) by mouth daily. 30 tablet 1  . clopidogrel (PLAVIX) 75 MG tablet Take 1 tablet (75 mg total) by mouth daily. 90 tablet 2  . furosemide (LASIX) 40 MG tablet Take 1 tablet (40 mg total) by mouth daily. 90 tablet 2  . LORazepam (ATIVAN) 1 MG tablet Take 1 tablet (1 mg total) by mouth 2 (two) times daily as needed for anxiety. 20 tablet 0  . metoprolol tartrate (LOPRESSOR) 25 MG tablet Take 1 tablet (25 mg total) by mouth 2 (two) times daily. 180 tablet 2  . mometasone-formoterol (DULERA) 100-5 MCG/ACT AERO Inhale 2 puffs into the lungs 2 (two) times daily.    . potassium chloride SA (KLOR-CON) 20 MEQ tablet Take 1 tablet (20 mEq total) by mouth daily. 90  tablet 2   No current facility-administered medications for this visit.    Allergies  Allergen Reactions  . Morphine And Related Rash    Review of Systems  Walking 10 to 20 minutes daily No angina. Weight stable. No fever.  BP 118/81 (BP Location: Right Arm)   Pulse 79   Temp 98.2 F (36.8 C) (Temporal)   Resp 20   Ht 5\' 11"  (1.803 m)   Wt 172 lb (78 kg)   SpO2 96% Comment: RA  BMI 23.99 kg/m  Physical Exam      Exam    General- alert and comfortable. Surgical incisions clean, dry    Neck- no JVD, no cervical adenopathy palpable, no carotid bruit   Lungs- clear without rales, wheezes   Cor- regular rate and rhythm, no murmur , gallop   Abdomen- soft, non-tender   Extremities - warm,  non-tender, minimal edema   Neuro- oriented, appropriate, no focal weakness   Diagnostic Tests: Chest x-ray personally reviewed showing clear lung fields no significant pleural effusion  Impression: Doing well 1 month after urgent CABG Karl Garcia understands he can resume driving and lifting up to 10 pounds. 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  He is encouraged to reduce cigarette smoking down to 0 and  avoid drinking more than 2 beers per day.  Plan: Continue current medications.  Final follow-up visit in 6-8 weeks with chest x-ray.   , MD [facility] [phone]

## 2020-03-23 ENCOUNTER — Encounter (HOSPITAL_COMMUNITY): Payer: Self-pay

## 2020-03-23 ENCOUNTER — Telehealth (HOSPITAL_COMMUNITY): Payer: Self-pay

## 2020-03-23 NOTE — Telephone Encounter (Signed)
Attempted to call patient in regards to Cardiac Rehab - LM on VM Mailed letter 

## 2020-03-24 ENCOUNTER — Other Ambulatory Visit: Payer: Self-pay | Admitting: Surgical

## 2020-03-24 ENCOUNTER — Other Ambulatory Visit: Payer: Self-pay | Admitting: Physician Assistant

## 2020-04-06 ENCOUNTER — Telehealth (HOSPITAL_COMMUNITY): Payer: Self-pay

## 2020-04-06 NOTE — Telephone Encounter (Signed)
No response from pt regarding CR.  Closed referral.  

## 2020-04-09 NOTE — Progress Notes (Signed)
Cardiology Office Note:    Date:  04/10/2020   ID:  Karl Garcia, DOB 02-18-1961, MRN [id number]  PCP:  Patient, No Pcp Per  Cardiologist:  Dietrich Pates, MD   Electrophysiologist:  None   Referring MD: No ref. provider found   Chief Complaint:  Follow-up (CAD)    Patient Profile:    Karl Garcia is a 59 y.o. male with:   Coronary artery disease ? S/p NSTEMI 02/2020 >> s/p CABG ? Echocardiogram 6/21: EF 55-60, Gr 2 DD, LAE  Hypertension   Hyperlipidemia   Diabetes mellitus   Peripheral Arterial Disease  L subclavian stenosis     Tobacco abuse  Alcohol abuse    Prior CV studies: Pre-CABG Dopplers 02/16/2020 Bilateral ICA 1-39; left subclavian stenosis ABIs:  R 0.64; L 0.86  Echocardiogram 02/16/2020 EF 55-60, normal wall motion, mild LVH, GRII DD, normal RV SF, moderate LAE, trivial MR  Cardiac catheterization 02/16/2020 LM ostial 25 LAD ostial 70, mid 90, distal 90 RI 90 LCx proximal 60 RCA proximal 65-45-50  History of Present Illness:    Karl Garcia was last seen in 03/2020.  He was seen for follow up after a recent admission with a NSTEMI followed by CABG.  I put him back on furosemide due to volume excess and I adjusted his Metoprolol.  I referred him to [facility] for subclavian stenosis and peripheral arterial disease.  He sees Dr. Allyson Sabal later this week.  He returns for follow up.    He is here alone.  He has not had significant shortness of breath.  His chest soreness is almost resolved.  He has not had syncope, HAs. His leg swelling is improved.     Past Medical History:  Diagnosis Date  . Coronary artery disease    S/p NSTEMI 02/2020 >> s/p CABG // Echocardiogram 6/21: EF 55-60, Gr 2 DD, LAE  . Diabetes mellitus Type 2   . Essential hypertension   . ETOH abuse   . Hx of NSTEMI in 02/2020   . Hyperlipidemia   . L subclavian stenosis   . Peripheral Arterial Disease   . Tobacco abuse     Current Medications: Current Meds  Medication Sig  .  acetaminophen (TYLENOL) 500 MG tablet Take 2 tablets (1,000 mg total) by mouth every 6 (six) hours.  Marland Kitchen aspirin 81 MG tablet Take 1 tablet (81 mg total) by mouth daily.  Marland Kitchen atorvastatin (LIPITOR) 80 MG tablet Take 1 tablet (80 mg total) by mouth daily.  . clopidogrel (PLAVIX) 75 MG tablet Take 1 tablet (75 mg total) by mouth daily.  . furosemide (LASIX) 40 MG tablet Take 1 tablet (40 mg total) by mouth daily.  Marland Kitchen LORazepam (ATIVAN) 1 MG tablet Take 1 tablet (1 mg total) by mouth 2 (two) times daily as needed for anxiety.  . metoprolol tartrate (LOPRESSOR) 25 MG tablet Take 1 tablet (25 mg total) by mouth 2 (two) times daily.  . mometasone-formoterol (DULERA) 100-5 MCG/ACT AERO Inhale 2 puffs into the lungs 2 (two) times daily.  . potassium chloride SA (KLOR-CON) 20 MEQ tablet Take 1 tablet (20 mEq total) by mouth daily.     Allergies:   Morphine and related   Social History   Tobacco Use  . Smoking status: Current Every Day Smoker    Packs/day: 1.50  . Smokeless tobacco: Never Used  Vaping Use  . Vaping Use: Never used  Substance Use Topics  . Alcohol use: Yes    Comment: 1  pint of liquor per day   . Drug use: Never     Family Hx: The patient's family history includes Healthy in his brother; Heart attack in his father and mother; Heart failure in his father.  ROS   EKGs/Labs/Other Test Reviewed:    EKG:  EKG is   ordered today.  The ekg ordered today demonstrates normal sinus rhythm, HR 78, normal axis, no ST-TW changes, QTc 405 ms   Recent Labs: 02/17/2020: TSH 1.563 02/18/2020: Magnesium 2.1 03/01/2020: Hemoglobin 10.1; Platelets 541 03/21/2020: ALT 20; BUN 14; Creatinine, Ser 0.66; Potassium 4.1; Sodium 140   Recent Lipid Panel Lab Results  Component Value Date/Time   CHOL 108 02/20/2020 11:55 AM   TRIG 109 02/20/2020 11:55 AM   HDL 30 (L) 02/20/2020 11:55 AM   CHOLHDL 3.6 02/20/2020 11:55 AM   LDLCALC 56 02/20/2020 11:55 AM    Physical Exam:    VS:  BP (!) 170/90    Pulse 78   Ht 5\' 11"  (1.803 m)   Wt 176 lb 12.8 oz (80.2 kg)   SpO2 97%   BMI 24.66 kg/m     Wt Readings from Last 3 Encounters:  04/10/20 176 lb 12.8 oz (80.2 kg)  03/21/20 172 lb (78 kg)  03/14/20 176 lb (79.8 kg)     Constitutional:      Appearance: Healthy appearance. Not in distress.  Neck:     Vascular: JVD normal.  Pulmonary:     Effort: Pulmonary effort is normal.     Breath sounds: No wheezing. No rales.  Cardiovascular:     Normal rate. Regular rhythm. Normal S1. Normal S2.     Murmurs: There is no murmur.  Edema:    Peripheral edema present.    Pretibial: bilateral trace edema of the pretibial area. Abdominal:     Palpations: Abdomen is soft.  Skin:    General: Skin is warm and dry.  Neurological:     General: No focal deficit present.     Mental Status: Alert and oriented to person, place and time.     Cranial Nerves: Cranial nerves are intact.       ASSESSMENT & PLAN:    1. Coronary artery disease involving native coronary artery of native heart without angina pectoris S/p NSTEMI in 02/2020 managed with CABG.  He is recovering nicely.  He is not having anginal symptoms.  He has not heard from cardiac rehabilitation.  I have asked him to call 03/2020 if he does not hear anything in the next 2 weeks.  Continue ASA, Clopidogrel (can DC after 02/2021), Atorvastatin, Metoprolol tartrate.  FU in 3 mos.   2. PAD (peripheral artery disease) (HCC) 3. Subclavian arterial stenosis (HCC) He notes improved claudication symptoms since last seen.  I have asked him to keep his appt with Dr. 03/2021 later this week for evaluation.    4. Essential hypertension BP above target.  Continue Metoprolol tartrate.  Start Lisinopril 20 mg once daily.  CMET in 1 week.  I have asked him to monitor his BP and call if his BP is > 130/80.  If it remains high, consider increasing beta-blocker dose.    5. Mixed hyperlipidemia Continue high intensity statin.  Obtain fasting Lipids in 1 week.    6. Tobacco use We discussed the importance of quitting and the risk of not quitting.    7. Diabetes Mellitus I have asked him to establish with primary care to have this managed.   Lab Results  Component Value Date   HGBA1C 6.8 (H) 02/16/2020       Dispo:  Return in about 3 months (around 07/11/2020) for Routine Follow Up, w/ Dr. Tenny Craw, or Tereso Newcomer, PA-C, in person.   Medication Adjustments/Labs and Tests Ordered: Current medicines are reviewed at length with the patient today.  Concerns regarding medicines are outlined above.  Tests Ordered: Orders Placed This Encounter  Procedures  . Comprehensive metabolic panel  . Lipid panel  . EKG 12-Lead   Medication Changes: Meds ordered this encounter  Medications  . lisinopril (ZESTRIL) 20 MG tablet    Sig: Take 1 tablet (20 mg total) by mouth daily.    Dispense:  90 tablet    Refill:  3    Signed, Tereso Newcomer, PA-C  04/10/2020 12:49 PM    [facility] [address] [place], [place], [place]  [address] Phone: [phone]; Fax: [phone]

## 2020-04-10 ENCOUNTER — Other Ambulatory Visit: Payer: Self-pay

## 2020-04-10 ENCOUNTER — Encounter: Payer: Self-pay | Admitting: Physician Assistant

## 2020-04-10 ENCOUNTER — Ambulatory Visit (INDEPENDENT_AMBULATORY_CARE_PROVIDER_SITE_OTHER): Payer: Self-pay | Admitting: Physician Assistant

## 2020-04-10 VITALS — BP 170/90 | HR 78 | Ht 71.0 in | Wt 176.8 lb

## 2020-04-10 DIAGNOSIS — I10 Essential (primary) hypertension: Secondary | ICD-10-CM

## 2020-04-10 DIAGNOSIS — E782 Mixed hyperlipidemia: Secondary | ICD-10-CM

## 2020-04-10 DIAGNOSIS — E119 Type 2 diabetes mellitus without complications: Secondary | ICD-10-CM

## 2020-04-10 DIAGNOSIS — I2510 Atherosclerotic heart disease of native coronary artery without angina pectoris: Secondary | ICD-10-CM

## 2020-04-10 DIAGNOSIS — I739 Peripheral vascular disease, unspecified: Secondary | ICD-10-CM

## 2020-04-10 DIAGNOSIS — I771 Stricture of artery: Secondary | ICD-10-CM

## 2020-04-10 DIAGNOSIS — Z720 Tobacco use: Secondary | ICD-10-CM

## 2020-04-10 MED ORDER — LISINOPRIL 20 MG PO TABS
20.0000 mg | ORAL_TABLET | Freq: Every day | ORAL | 3 refills | Status: DC
Start: 2020-04-10 — End: 2020-06-22

## 2020-04-10 NOTE — Patient Instructions (Addendum)
Medication Instructions:  Your physician has recommended you make the following change in your medication:   1) Start Lisinopril 20 mg, 1 tablet by mouth once a day  *If you need a refill on your cardiac medications before your next appointment, please call your pharmacy*  Lab Work: Your physician recommends that you return for lab work in 1 week on 04/17/20  Testing/Procedures: None ordered today  Follow-Up:  On 07/11/20 at 10:45AM with Tereso Newcomer, PA-C  Other Instructions Check blood pressure 3-4 times a week and call if blood pressure is consistently 130/80 or greater. Establish care with a primary care doctor.

## 2020-04-12 ENCOUNTER — Other Ambulatory Visit: Payer: Self-pay

## 2020-04-12 ENCOUNTER — Encounter: Payer: Self-pay | Admitting: Cardiovascular Disease

## 2020-04-12 ENCOUNTER — Ambulatory Visit (INDEPENDENT_AMBULATORY_CARE_PROVIDER_SITE_OTHER): Payer: Self-pay | Admitting: Cardiovascular Disease

## 2020-04-12 VITALS — BP 122/76 | HR 82 | Ht 71.0 in | Wt 174.6 lb

## 2020-04-12 DIAGNOSIS — I771 Stricture of artery: Secondary | ICD-10-CM | POA: Insufficient documentation

## 2020-04-12 DIAGNOSIS — F17200 Nicotine dependence, unspecified, uncomplicated: Secondary | ICD-10-CM | POA: Insufficient documentation

## 2020-04-12 DIAGNOSIS — Z720 Tobacco use: Secondary | ICD-10-CM

## 2020-04-12 DIAGNOSIS — I739 Peripheral vascular disease, unspecified: Secondary | ICD-10-CM

## 2020-04-12 NOTE — Assessment & Plan Note (Signed)
Greater than 60-pack-year tobacco abuse having smoked 1-1/2 packs a day for 40 years now down to 1/2 pack a day.  I did stress the importance of smoking cessation.

## 2020-04-12 NOTE — Assessment & Plan Note (Signed)
Karl Garcia was referred back to me by Tereso Newcomer, PA-C because of left subclavian artery stenosis and PAD.  His Dopplers revealed a 23 mm upper extremity pressure differential and retrograde left vertebral filling with monophasic waveforms.  He did have bypass surgery by Dr. Maren Beach 02/17/2020 with a LIMA to the LAD.  He denies left upper extremity claudication or subclavian steal symptoms.  We will continue to follow this conservatively

## 2020-04-12 NOTE — Progress Notes (Signed)
04/12/2020 Karl Garcia   11-16-1960  [id number]  Primary Physician Patient, No Pcp Per Primary Cardiologist: Karl Gess MD Karl Garcia, [place], [place]  HPI:  Karl Garcia is a 59 y.o. thin appearing single Caucasian male with no children who works as a Writer was referred by Karl Newcomer, PA-C for evaluation of left subclavian artery stenosis and PAD.  His risk factors include 60-pack-year tobacco abuse currently smoking 1/2 pack/day as well as hyperlipidemia.  He presented with chest pain and non-STEMI on 02/16/2020.  I performed diagnostic coronary angiography via the right radial approach revealing three-vessel disease.  Ultimately underwent CABG x3 the following day by Dr. Maren Garcia with a LIMA to his LAD, vein graft to intermediate branch and to the PDA.  Postop course was unremarkable.  Recuperated nicely.  He denies chest pain or shortness of breath or claudication.  There is no evidence of subclavian steal.   Current Meds  Medication Sig  . acetaminophen (TYLENOL) 500 MG tablet Take 2 tablets (1,000 mg total) by mouth every 6 (six) hours.  Marland Kitchen aspirin 81 MG tablet Take 1 tablet (81 mg total) by mouth daily.  Marland Kitchen atorvastatin (LIPITOR) 80 MG tablet Take 1 tablet (80 mg total) by mouth daily.  . clopidogrel (PLAVIX) 75 MG tablet Take 1 tablet (75 mg total) by mouth daily.  . furosemide (LASIX) 40 MG tablet Take 1 tablet (40 mg total) by mouth daily.  Marland Kitchen lisinopril (ZESTRIL) 20 MG tablet Take 1 tablet (20 mg total) by mouth daily.  Marland Kitchen LORazepam (ATIVAN) 1 MG tablet Take 1 tablet (1 mg total) by mouth 2 (two) times daily as needed for anxiety.  . metoprolol tartrate (LOPRESSOR) 25 MG tablet Take 1 tablet (25 mg total) by mouth 2 (two) times daily.  . mometasone-formoterol (DULERA) 100-5 MCG/ACT AERO Inhale 2 puffs into the lungs 2 (two) times daily.  . potassium chloride SA (KLOR-CON) 20 MEQ tablet Take 1 tablet (20 mEq total) by mouth daily.     Allergies  Allergen  Reactions  . Morphine And Related Rash    Social History   Socioeconomic History  . Marital status: Single    Spouse name: Not on file  . Number of children: Not on file  . Years of education: Not on file  . Highest education level: Not on file  Occupational History  . Not on file  Tobacco Use  . Smoking status: Current Every Day Smoker    Packs/day: 1.50  . Smokeless tobacco: Never Used  Vaping Use  . Vaping Use: Never used  Substance and Sexual Activity  . Alcohol use: Yes    Comment: 1 pint of liquor per day   . Drug use: Never  . Sexual activity: Not on file  Other Topics Concern  . Not on file  Social History Narrative  . Not on file   Social Determinants of Health   Financial Resource Strain:   . Difficulty of Paying Living Expenses:   Food Insecurity:   . Worried About Programme researcher, broadcasting/film/video in the Last Year:   . Barista in the Last Year:   Transportation Needs:   . Freight forwarder (Medical):   Marland Kitchen Lack of Transportation (Non-Medical):   Physical Activity:   . Days of Exercise per Week:   . Minutes of Exercise per Session:   Stress:   . Feeling of Stress :   Social Connections:   . Frequency of Communication  with Friends and Family:   . Frequency of Social Gatherings with Friends and Family:   . Attends Religious Services:   . Active Member of Clubs or Organizations:   . Attends Banker Meetings:   Marland Kitchen Marital Status:   Intimate Partner Violence:   . Fear of Current or Ex-Partner:   . Emotionally Abused:   Marland Kitchen Physically Abused:   . Sexually Abused:      Review of Systems: General: negative for chills, fever, night sweats or weight changes.  Cardiovascular: negative for chest pain, dyspnea on exertion, edema, orthopnea, palpitations, paroxysmal nocturnal dyspnea or shortness of breath Dermatological: negative for rash Respiratory: negative for cough or wheezing Urologic: negative for hematuria Abdominal: negative for nausea,  vomiting, diarrhea, bright red blood per rectum, melena, or hematemesis Neurologic: negative for visual changes, syncope, or dizziness All other systems reviewed and are otherwise negative except as noted above.    Blood pressure 122/76, pulse 82, height 5\' 11"  (1.803 m), weight 174 lb 9.6 oz (79.2 kg), SpO2 98 %.  General appearance: alert and no distress Neck: no adenopathy, no carotid bruit, no JVD, supple, symmetrical, trachea midline and thyroid not enlarged, symmetric, no tenderness/mass/nodules Lungs: clear to auscultation bilaterally Heart: normal apical impulse Extremities: extremities normal, atraumatic, no cyanosis or edema Pulses: 2+ and symmetric Skin: Skin color, texture, turgor normal. No rashes or lesions Neurologic: Alert and oriented X 3, normal strength and tone. Normal symmetric reflexes. Normal coordination and gait  EKG not performed today  ASSESSMENT AND PLAN:   Stenosis of left subclavian artery [facility]) Mr. Hechler was referred back to me by Karl Fillers, PA-C because of left subclavian artery stenosis and PAD.  His Dopplers revealed a 23 mm upper extremity pressure differential and retrograde left vertebral filling with monophasic waveforms.  He did have bypass surgery by Dr. Tereso Garcia 02/17/2020 with a LIMA to the LAD.  He denies left upper extremity claudication or subclavian steal symptoms.  We will continue to follow this conservatively  Peripheral arterial disease (HCC) History of claudication with Dopplers that revealed a right ABI of 0.64 and a left of 0.86.  Since his bypass surgery he denies claudication.  I am going to get formal lower extremity arterial Doppler studies to further characterize  Tobacco abuse Greater than 60-pack-year tobacco abuse having smoked 1-1/2 packs a day for 40 years now down to 1/2 pack a day.  I did stress the importance of smoking cessation.      04/18/2020 MD FACP,FACC,FAHA, [facility] 04/12/2020 11:02 AM

## 2020-04-12 NOTE — Assessment & Plan Note (Signed)
History of claudication with Dopplers that revealed a right ABI of 0.64 and a left of 0.86.  Since his bypass surgery he denies claudication.  I am going to get formal lower extremity arterial Doppler studies to further characterize

## 2020-04-12 NOTE — Patient Instructions (Signed)
Medication Instructions:  Continue current medications  *If you need a refill on your cardiac medications before your next appointment, please call your pharmacy*   Lab Work: None ordered   Testing/Procedures: Your physician has requested that you have a lower extremity arterial duplex. This test is an ultrasound of the arteries in the legs or arms. It looks at arterial blood flow in the legs and arms. Allow one hour for Lower and Upper Arterial scans. There are no restrictions or special instructions   Follow-Up: At [facility], you and your health needs are our priority.  As part of our continuing mission to provide you with exceptional heart care, we have created designated Provider Care Teams.  These Care Teams include your primary Cardiologist (physician) and Advanced Practice Providers (APPs -  Physician Assistants and Nurse Practitioners) who all work together to provide you with the care you need, when you need it.  We recommend signing up for the patient portal called "MyChart".  Sign up information is provided on this After Visit Summary.  MyChart is used to connect with patients for Virtual Visits (Telemedicine).  Patients are able to view lab/test results, encounter notes, upcoming appointments, etc.  Non-urgent messages can be sent to your provider as well.   To learn more about what you can do with MyChart, go to [email or web address].    Your next appointment:   1 year(s)  The format for your next appointment:   In Person  Provider:   You may see Nanetta Batty, MD or one of the following Advanced Practice Providers on your designated Care Team:    Corine Shelter, PA-C  [place], [place]  Edd Fabian, [place]

## 2020-04-17 ENCOUNTER — Other Ambulatory Visit: Payer: Self-pay

## 2020-04-17 DIAGNOSIS — I739 Peripheral vascular disease, unspecified: Secondary | ICD-10-CM

## 2020-04-17 DIAGNOSIS — E782 Mixed hyperlipidemia: Secondary | ICD-10-CM

## 2020-04-17 DIAGNOSIS — I2510 Atherosclerotic heart disease of native coronary artery without angina pectoris: Secondary | ICD-10-CM

## 2020-04-17 DIAGNOSIS — I10 Essential (primary) hypertension: Secondary | ICD-10-CM

## 2020-04-17 DIAGNOSIS — I771 Stricture of artery: Secondary | ICD-10-CM

## 2020-04-18 LAB — LIPID PANEL
Chol/HDL Ratio: 2.7 ratio (ref 0.0–5.0)
Cholesterol, Total: 125 mg/dL (ref 100–199)
HDL: 47 mg/dL (ref >39)
LDL Chol Calc (NIH): 65 mg/dL (ref 0–99)
Triglycerides: 63 mg/dL (ref 0–149)
VLDL Cholesterol Cal: 13 mg/dL (ref 5–40)

## 2020-04-18 LAB — COMPREHENSIVE METABOLIC PANEL
ALT: 17 IU/L (ref 0–44)
AST: 22 IU/L (ref 0–40)
Albumin/Globulin Ratio: 1.6 (ref 1.2–2.2)
Albumin: 4.1 g/dL (ref 3.8–4.8)
Alkaline Phosphatase: 96 IU/L (ref 48–121)
BUN/Creatinine Ratio: 20 (ref 10–24)
BUN: 14 mg/dL (ref 8–27)
Bilirubin Total: 0.5 mg/dL (ref 0.0–1.2)
CO2: 28 mmol/L (ref 20–29)
Calcium: 9.3 mg/dL (ref 8.6–10.2)
Chloride: 99 mmol/L (ref 96–106)
Creatinine, Ser: 0.7 mg/dL — ABNORMAL LOW (ref 0.76–1.27)
GFR calc Af Amer: 118 mL/min/{1.73_m2} (ref >59)
GFR calc non Af Amer: 102 mL/min/{1.73_m2} (ref >59)
Globulin, Total: 2.5 g/dL (ref 1.5–4.5)
Glucose: 110 mg/dL — ABNORMAL HIGH (ref 65–99)
Potassium: 4 mmol/L (ref 3.5–5.2)
Sodium: 142 mmol/L (ref 134–144)
Total Protein: 6.6 g/dL (ref 6.0–8.5)

## 2020-04-19 ENCOUNTER — Other Ambulatory Visit: Payer: Self-pay | Admitting: Cardiovascular Disease

## 2020-04-19 ENCOUNTER — Other Ambulatory Visit (HOSPITAL_COMMUNITY): Payer: Self-pay | Admitting: Cardiovascular Disease

## 2020-04-19 DIAGNOSIS — I739 Peripheral vascular disease, unspecified: Secondary | ICD-10-CM

## 2020-05-01 ENCOUNTER — Ambulatory Visit (HOSPITAL_COMMUNITY): Admission: RE | Admit: 2020-05-01 | Payer: Self-pay | Source: Ambulatory Visit

## 2020-05-03 ENCOUNTER — Other Ambulatory Visit: Payer: Self-pay | Admitting: Physician Assistant

## 2020-05-06 ENCOUNTER — Other Ambulatory Visit: Payer: Self-pay | Admitting: Physician Assistant

## 2020-05-15 ENCOUNTER — Other Ambulatory Visit: Payer: Self-pay | Admitting: Cardiothoracic Surgery

## 2020-05-15 DIAGNOSIS — Z951 Presence of aortocoronary bypass graft: Secondary | ICD-10-CM

## 2020-05-16 ENCOUNTER — Ambulatory Visit (INDEPENDENT_AMBULATORY_CARE_PROVIDER_SITE_OTHER): Payer: Self-pay | Admitting: Cardiothoracic Surgery

## 2020-05-16 ENCOUNTER — Encounter: Payer: Self-pay | Admitting: Cardiothoracic Surgery

## 2020-05-16 ENCOUNTER — Ambulatory Visit
Admission: RE | Admit: 2020-05-16 | Discharge: 2020-05-16 | Disposition: A | Discharge status: Home / Self Care | Payer: Self-pay | Source: Ambulatory Visit | Attending: Cardiothoracic Surgery | Admitting: Cardiothoracic Surgery

## 2020-05-16 ENCOUNTER — Other Ambulatory Visit: Payer: Self-pay

## 2020-05-16 ENCOUNTER — Ambulatory Visit: Payer: Self-pay | Admitting: Cardiothoracic Surgery

## 2020-05-16 VITALS — BP 127/70 | HR 80 | Temp 98.4°F | Resp 20 | Ht 71.0 in | Wt 173.4 lb

## 2020-05-16 DIAGNOSIS — Z951 Presence of aortocoronary bypass graft: Secondary | ICD-10-CM

## 2020-05-16 DIAGNOSIS — Z4889 Encounter for other specified surgical aftercare: Secondary | ICD-10-CM | POA: Insufficient documentation

## 2020-05-16 MED ORDER — METOPROLOL SUCCINATE ER 25 MG PO TB24: 25.0000 mg | ORAL_TABLET | Freq: Every day | ORAL | 2 refills | Status: DC

## 2020-05-16 NOTE — Progress Notes (Signed)
PCP is Patient, No Pcp Per Referring Provider is Pricilla Riffle, MD  Chief Complaint  Patient presents with  . Follow-up    s/p CABG x3 with chest xray    HPI:  The patient returns for final postop follow-up 3 months after emergency CABG for left main stenosis.  He is a heavy smoker and has COPD but his pulmonary function remain adequate postoperatively.  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  Sternal wires are intact.  Cardiac silhouette is stable.   Past Medical History:  Diagnosis Date  . Coronary artery disease    S/p NSTEMI 02/2020 >> s/p CABG // Echocardiogram 6/21: EF 55-60, Gr 2 DD, LAE  . Diabetes mellitus Type 2   . Essential hypertension   . ETOH abuse   . Hx of NSTEMI in 02/2020   . Hyperlipidemia   . L subclavian stenosis   . Peripheral Arterial Disease   . Tobacco abuse     Past Surgical History:  Procedure Laterality Date  . CORONARY ARTERY BYPASS GRAFT N/A 02/17/2020   Procedure: CORONARY ARTERY BYPASS GRAFTING (CABG) x Three, using left internal mammry artery and right leg greater saphenous vein harvested endoscopically;  Surgeon: Kerin Perna, MD;  Location: [facility] OR;  Service: Open Heart Surgery;  Laterality: N/A;  . LEFT HEART CATH AND CORONARY ANGIOGRAPHY N/A 02/16/2020   Procedure: LEFT HEART CATH AND CORONARY ANGIOGRAPHY;  Surgeon: Runell Gess, MD;  Location: [facility] INVASIVE CV LAB;  Service: Cardiovascular;  Laterality: N/A;  . TEE WITHOUT CARDIOVERSION N/A 02/17/2020   Procedure: TRANSESOPHAGEAL ECHOCARDIOGRAM (TEE);  Surgeon: Donata Clay, Theron Arista, MD;  Location: [facility] OR;  Service: Open Heart Surgery;  Laterality: N/A;  . TONSILECTOMY, ADENOIDECTOMY, BILATERAL MYRINGOTOMY AND TUBES     unsure of all of that but + tonsilectomy    Family History   Problem Relation Age of Onset  . Heart attack Mother   . Heart attack Father   . Heart failure Father   . Healthy Brother     Social History Social History   Tobacco Use  . Smoking status: Current Every Day Smoker    Packs/day: 1.50  . Smokeless tobacco: Never Used  Vaping Use  . Vaping Use: Never used  Substance Use Topics  . Alcohol use: Yes    Comment: 1 pint of liquor per day   . Drug use: Never    Current Outpatient Medications  Medication Sig Dispense Refill  . acetaminophen (TYLENOL) 500 MG tablet Take 2 tablets (1,000 mg total) by mouth every 6 (six) hours. 30 tablet 0  . aspirin 81 MG tablet Take 1 tablet (81 mg total) by mouth daily.    Marland Kitchen atorvastatin (LIPITOR) 80 MG tablet Take 1 tablet (80 mg total) by mouth daily. 30 tablet 1  . clopidogrel (PLAVIX) 75 MG tablet Take 1 tablet (75 mg total) by mouth daily. 90 tablet 2  . furosemide (LASIX) 40 MG tablet Take 1 tablet (40 mg total) by mouth daily. 90 tablet 2  . lisinopril (ZESTRIL) 20 MG tablet Take 1 tablet (20 mg total) by mouth daily. 90 tablet 3  . LORazepam (ATIVAN) 1 MG tablet Take 1 tablet (1 mg total) by mouth 2 (two) times daily as needed for anxiety. 20 tablet 0  .  metoprolol tartrate (LOPRESSOR) 25 MG tablet Take 1 tablet (25 mg total) by mouth 2 (two) times daily. 180 tablet 2  . mometasone-formoterol (DULERA) 100-5 MCG/ACT AERO Inhale 2 puffs into the lungs 2 (two) times daily.    . potassium chloride SA (KLOR-CON) 20 MEQ tablet Take 1 tablet (20 mEq total) by mouth daily. 90 tablet 2   No current facility-administered medications for this visit.    Allergies  Allergen Reactions  . Morphine And Related Rash    Review of Systems  Weight stable No abdominal pain No palpitations No dizziness No claudication  There were no vitals taken for this visit. Physical Exam      Exam    General- alert and comfortable.  Sternal incision well-healed.  Leg incision well-healed.    Neck- no JVD, no  cervical adenopathy palpable, no carotid bruit   Lungs- clear without rales, wheezes   Cor- regular rate and rhythm, no murmur , gallop.  Left radial pulse weaker than right pulse due to subclavian stenosis on the left   Abdomen- soft, non-tender   Extremities - warm, non-tender, minimal edema   Neuro- oriented, appropriate, no focal weakness   Diagnostic Tests: Chest x-ray performed today personally reviewed with results as noted above.  Impression: Doing well now just over 3 months after emergency CABG.  His activity restrictions are now lifted but he is encouraged to gradually resume his exercise intensity and weight limits.  Plan: He is recommended to continue a heart healthy diet including smoke cessation, ambulation-aerobic activity 40 minutes 5 days a week and heart healthy diet.  He is encouraged to continue his current medications including aspirin, statin, beta-blocker,.  He will return as needed.   Mikey Bussing, MD [facility] [phone]

## 2020-05-21 ENCOUNTER — Telehealth: Payer: Self-pay | Admitting: Internal Medicine

## 2020-05-21 MED ORDER — ATORVASTATIN CALCIUM 80 MG PO TABS: 80.0000 mg | ORAL_TABLET | Freq: Every day | ORAL | 3 refills | Status: DC

## 2020-05-21 MED ORDER — POTASSIUM CHLORIDE CRYS ER 20 MEQ PO TBCR
20.0000 meq | EXTENDED_RELEASE_TABLET | Freq: Every day | ORAL | 3 refills | Status: DC
Start: 2020-05-21 — End: 2020-06-22

## 2020-05-21 NOTE — Telephone Encounter (Signed)
*  STAT* If patient is at the pharmacy, call can be transferred to refill team.   1. Which medications need to be refilled? (please list name of each medication and dose if known)  atorvastatin (LIPITOR) 80 MG tablet mometasone-formoterol (DULERA) 100-5 MCG/ACT AERO potassium chloride SA (KLOR-CON) 20 MEQ tablet  2. Which pharmacy/location (including street and city if local pharmacy) is medication to be sent to?  [facility] [address], [place] - [address] Phone:  [phone]          3. Do they need a 30 day or 90 day supply? 30 day

## 2020-05-21 NOTE — Telephone Encounter (Signed)
Called pt and spoke with pt's significant other [name], informing her that pt needs to refill his medication mometasone (inhaler) with his PCP, but the other two medications atorvastatin and potassium, I have sent to pt's pharmacy as requested. Confirmation received. I informed [name], that if the pt has any other problems, questions or concerns, to give our office a call back. [name] verbalized understanding.

## 2020-05-23 ENCOUNTER — Other Ambulatory Visit: Payer: Self-pay

## 2020-05-23 ENCOUNTER — Ambulatory Visit (HOSPITAL_COMMUNITY)
Admission: RE | Admit: 2020-05-23 | Discharge: 2020-05-23 | Disposition: A | Discharge status: Home / Self Care | Payer: Self-pay | Source: Ambulatory Visit | Attending: Cardiology | Admitting: Cardiology

## 2020-05-23 DIAGNOSIS — I739 Peripheral vascular disease, unspecified: Secondary | ICD-10-CM | POA: Insufficient documentation

## 2020-05-29 ENCOUNTER — Encounter: Payer: Self-pay | Admitting: Cardiovascular Disease

## 2020-05-29 ENCOUNTER — Other Ambulatory Visit: Payer: Self-pay | Admitting: *Deleted

## 2020-05-29 ENCOUNTER — Ambulatory Visit (INDEPENDENT_AMBULATORY_CARE_PROVIDER_SITE_OTHER): Payer: Self-pay | Admitting: Cardiovascular Disease

## 2020-05-29 ENCOUNTER — Other Ambulatory Visit: Payer: Self-pay

## 2020-05-29 DIAGNOSIS — I739 Peripheral vascular disease, unspecified: Secondary | ICD-10-CM

## 2020-05-29 MED ORDER — SODIUM CHLORIDE 0.9% FLUSH: 3.0000 mL | Freq: Two times a day (BID) | INTRAVENOUS | Status: DC

## 2020-05-29 NOTE — Patient Instructions (Addendum)
Medication Instructions:  None Ordered At This Time.  *If you need a refill on your cardiac medications before your next appointment, please call your pharmacy*  Lab Work: BMET, CBC October 14th  If you have labs (blood work) drawn today and your tests are completely normal, you will receive your results only by: Marland Kitchen MyChart Message (if you have MyChart) OR . A paper copy in the mail If you have any lab test that is abnormal or we need to change your treatment, we will call you to review the results.  Testing/Procedures:    [facility]ARDIOVASCULAR DIVISION [facility] [address] [place] [address] Dept: [phone] Loc: [phone]  Karl Garcia  05/29/2020  You are scheduled for a Peripheral Angiogram on Monday, October 18 with Dr. Nanetta Batty.  1. Please arrive at the [facility] (Main Entrance A) at [facility]: [address] [place], [place] [address] at 5:30 AM (This time is two hours before your procedure to ensure your preparation). Free valet parking service is available.   Special note: Every effort is made to have your procedure done on time. Please understand that emergencies sometimes delay scheduled procedures.  2. Diet: Do not eat solid foods after midnight.  The patient may have clear liquids until 5am upon the day of the procedure.  3. Labs: You will need to have blood drawn on Thursday, October 14 at [facility] [address], [place]  Open: 8am - 5pm (Lunch 12:30 - 1:30)   Phone: [phone]. You do not need to be fasting.  4. Medication instructions in preparation for your procedure:   Contrast Allergy: No  PLEASE HOLD LASIX MORNING OF PROCEDURE   On the morning of your procedure, take your Aspirin/plavix and any morning medicines NOT listed above.  You may use sips of water.  5. Plan for one night stay--bring personal belongings. 6. Bring a current list of your  medications and current insurance cards. 7. You MUST have a responsible person to drive you home. 8. Someone MUST be with you the first 24 hours after you arrive home or your discharge will be delayed. 9. Please wear clothes that are easy to get on and off and wear slip-on shoes.  Thank you for allowing [place] to care for you!   -- [facility]  Your physician has requested that you have a lower extremity arterial duplex 1 week after procedure. During this test, ultrasound is used to evaluate arterial blood flow in the legs. Allow one hour for this exam. There are no restrictions or special instructions. This will take place at [address] [facility], [address].  Your physician has requested that you have an ankle brachial index (ABI) 1 week after procedure. During this test an ultrasound and blood pressure cuff are used to evaluate the arteries that supply the arms and legs with blood. Allow thirty minutes for this exam. There are no restrictions or special instructions. This will take place at [address] [facility], [address].   Follow-Up: At [facility], you and your health needs are our priority.  As part of our continuing mission to provide you with exceptional heart care, we have created designated Provider Care Teams.  These Care Teams include your primary Cardiologist (physician) and Advanced Practice Providers (APPs -  Physician Assistants and Nurse Practitioners) who all work together to provide you with the care you need, when you need it.  We  recommend signing up for the patient portal called "MyChart".  Sign up information is provided on this After Visit Summary.  MyChart is used to connect with patients for Virtual Visits (Telemedicine).  Patients are able to view lab/test results, encounter notes, upcoming appointments, etc.  Non-urgent messages can be sent to your provider as well.   To learn more about what you can do with MyChart, go to  [email or web address].    Your next appointment:   2 WEEKS AFTER PROCEDURE 10/18  The format for your next appointment:   In Person  Provider:   Nanetta Batty, MD   Other Instructions COVID TEST- October 14th, at 11am. [address] [facility] [place]. [place]ice

## 2020-05-29 NOTE — Progress Notes (Signed)
05/29/2020 Karl Garcia   [birth year]  [id number]  Primary Physician Patient, No Pcp Per Primary Cardiologist: Runell Gess MD Milagros Loll, [place], [place]  HPI:  Karl Garcia is a 59 y.o.    thin appearing single Caucasian male with no children who works as a Writer was referred by Karl Newcomer, PA-C for evaluation of left subclavian artery stenosis and PAD.    I last saw him in the office 04/12/2020.  His risk factors include 60-pack-year tobacco abuse currently smoking 1/2 pack/day as well as hyperlipidemia.  He presented with chest pain and non-STEMI on 02/16/2020.  I performed diagnostic coronary angiography via the right radial approach revealing three-vessel disease.  Ultimately underwent CABG x3 the following day by Dr. Maren Garcia with a LIMA to his LAD, vein graft to intermediate branch and to the PDA.  Postop course was unremarkable.  Recuperated nicely.  He denies chest pain or shortness of breath or claudication.  There is no evidence of subclavian steal.  I performed lower extremity arterial Doppler studies on him 05/23/2020 revealing a noncompressive right ABI and a left ABI of 0.54.  He had an occluded right common iliac and moderate left common iliac artery stenosis.  He does have lifestyle limiting claudication.  Unfortunately he does continue to smoke as well.  We will schedule him for angiography and endovascular therapy sometime within the next 3 to 4 weeks.   Current Meds  Medication Sig  . acetaminophen (TYLENOL) 500 MG tablet Take 2 tablets (1,000 mg total) by mouth every 6 (six) hours.  Marland Kitchen aspirin 81 MG tablet Take 1 tablet (81 mg total) by mouth daily.  Marland Kitchen atorvastatin (LIPITOR) 80 MG tablet Take 1 tablet (80 mg total) by mouth daily.  . clopidogrel (PLAVIX) 75 MG tablet Take 1 tablet (75 mg total) by mouth daily.  . furosemide (LASIX) 40 MG tablet Take 1 tablet (40 mg total) by mouth daily.  Marland Kitchen lisinopril (ZESTRIL) 20 MG tablet Take 1 tablet (20 mg total) by mouth  daily.  Marland Kitchen LORazepam (ATIVAN) 1 MG tablet Take 1 tablet (1 mg total) by mouth 2 (two) times daily as needed for anxiety.  . metoprolol succinate (TOPROL XL) 25 MG 24 hr tablet Take 1 tablet (25 mg total) by mouth daily.  . mometasone-formoterol (DULERA) 100-5 MCG/ACT AERO Inhale 2 puffs into the lungs 2 (two) times daily.   . potassium chloride SA (KLOR-CON) 20 MEQ tablet Take 1 tablet (20 mEq total) by mouth daily.     Allergies  Allergen Reactions  . Morphine And Related Rash    Social History   Socioeconomic History  . Marital status: Single    Spouse name: Not on file  . Number of children: Not on file  . Years of education: Not on file  . Highest education level: Not on file  Occupational History  . Not on file  Tobacco Use  . Smoking status: Current Every Day Smoker    Packs/day: 1.50  . Smokeless tobacco: Never Used  Vaping Use  . Vaping Use: Never used  Substance and Sexual Activity  . Alcohol use: Yes    Comment: 1 pint of liquor per day   . Drug use: Never  . Sexual activity: Not on file  Other Topics Concern  . Not on file  Social History Narrative  . Not on file   Social Determinants of Health   Financial Resource Strain:   . Difficulty of Paying Living Expenses:  Not on file  Food Insecurity:   . Worried About Programme researcher, broadcasting/film/video in the Last Year: Not on file  . Ran Out of Food in the Last Year: Not on file  Transportation Needs:   . Lack of Transportation (Medical): Not on file  . Lack of Transportation (Non-Medical): Not on file  Physical Activity:   . Days of Exercise per Week: Not on file  . Minutes of Exercise per Session: Not on file  Stress:   . Feeling of Stress : Not on file  Social Connections:   . Frequency of Communication with Friends and Family: Not on file  . Frequency of Social Gatherings with Friends and Family: Not on file  . Attends Religious Services: Not on file  . Active Member of Clubs or Organizations: Not on file  . Attends  Banker Meetings: Not on file  . Marital Status: Not on file  Intimate Partner Violence:   . Fear of Current or Ex-Partner: Not on file  . Emotionally Abused: Not on file  . Physically Abused: Not on file  . Sexually Abused: Not on file     Review of Systems: General: negative for chills, fever, night sweats or weight changes.  Cardiovascular: negative for chest pain, dyspnea on exertion, edema, orthopnea, palpitations, paroxysmal nocturnal dyspnea or shortness of breath Dermatological: negative for rash Respiratory: negative for cough or wheezing Urologic: negative for hematuria Abdominal: negative for nausea, vomiting, diarrhea, bright red blood per rectum, melena, or hematemesis Neurologic: negative for visual changes, syncope, or dizziness All other systems reviewed and are otherwise negative except as noted above.    Blood pressure 118/76, pulse 72, height 5\' 11"  (1.803 m), weight 170 lb 9.6 oz (77.4 kg), SpO2 99 %.  General appearance: alert and no distress Neck: no adenopathy, no carotid bruit, no JVD, supple, symmetrical, trachea midline and thyroid not enlarged, symmetric, no tenderness/mass/nodules Lungs: clear to auscultation bilaterally Heart: regular rate and rhythm, S1, S2 normal, no murmur, click, rub or gallop Extremities: extremities normal, atraumatic, no cyanosis or edema Pulses: Diminished pedal pulses Skin: Skin color, texture, turgor normal. No rashes or lesions Neurologic: Alert and oriented X 3, normal strength and tone. Normal symmetric reflexes. Normal coordination and gait  EKG not performed today  ASSESSMENT AND PLAN:   Peripheral arterial disease (HCC) History of symptomatic PAD with bilateral hip buttock and lower extremity claudication.  He had Doppler studies performed 05/23/2020 revealing an occluded right iliac with moderate left iliac disease.  He wishes to proceed with angiography and endovascular therapy for lifestyle limiting  claudication.      05/25/2020 MD FACP,FACC,FAHA, [facility] 05/29/2020 11:22 AM

## 2020-05-29 NOTE — Assessment & Plan Note (Signed)
History of symptomatic PAD with bilateral hip buttock and lower extremity claudication.  He had Doppler studies performed 05/23/2020 revealing an occluded right iliac with moderate left iliac disease.  He wishes to proceed with angiography and endovascular therapy for lifestyle limiting claudication.

## 2020-06-06 ENCOUNTER — Telehealth: Payer: Self-pay

## 2020-06-06 NOTE — Telephone Encounter (Signed)
Spoke to pt who report he is scheduled for PV procedure on 11/18 and questioning if Dr. Allyson Sabal could move up surgery date. Pt report leg pain is worsening.  Will route to MD for recommendations.

## 2020-06-08 NOTE — Telephone Encounter (Signed)
Dr. Allyson Sabal informed that pt's procedure is scheduled for 10/18 instead of 11/18. MD voiced that's the earliest he can perform procedure.  Pt made aware and verbalized understanding.

## 2020-06-08 NOTE — Telephone Encounter (Signed)
I am happy to move up his procedure to the third or fourth week of October.  If we do reschedule make sure that the same wraps that I had plan to be there for his procedure are there for the rescheduled procedure.

## 2020-06-18 IMAGING — CR DG CHEST 2V
2 series · 2 of 2 positions shown · non-contrast
Comparison: Chest x-ray 02/19/2020.

CLINICAL DATA: 59-year-old male with atelectasis.

EXAM:
CHEST - 2 VIEW

[chest pa]
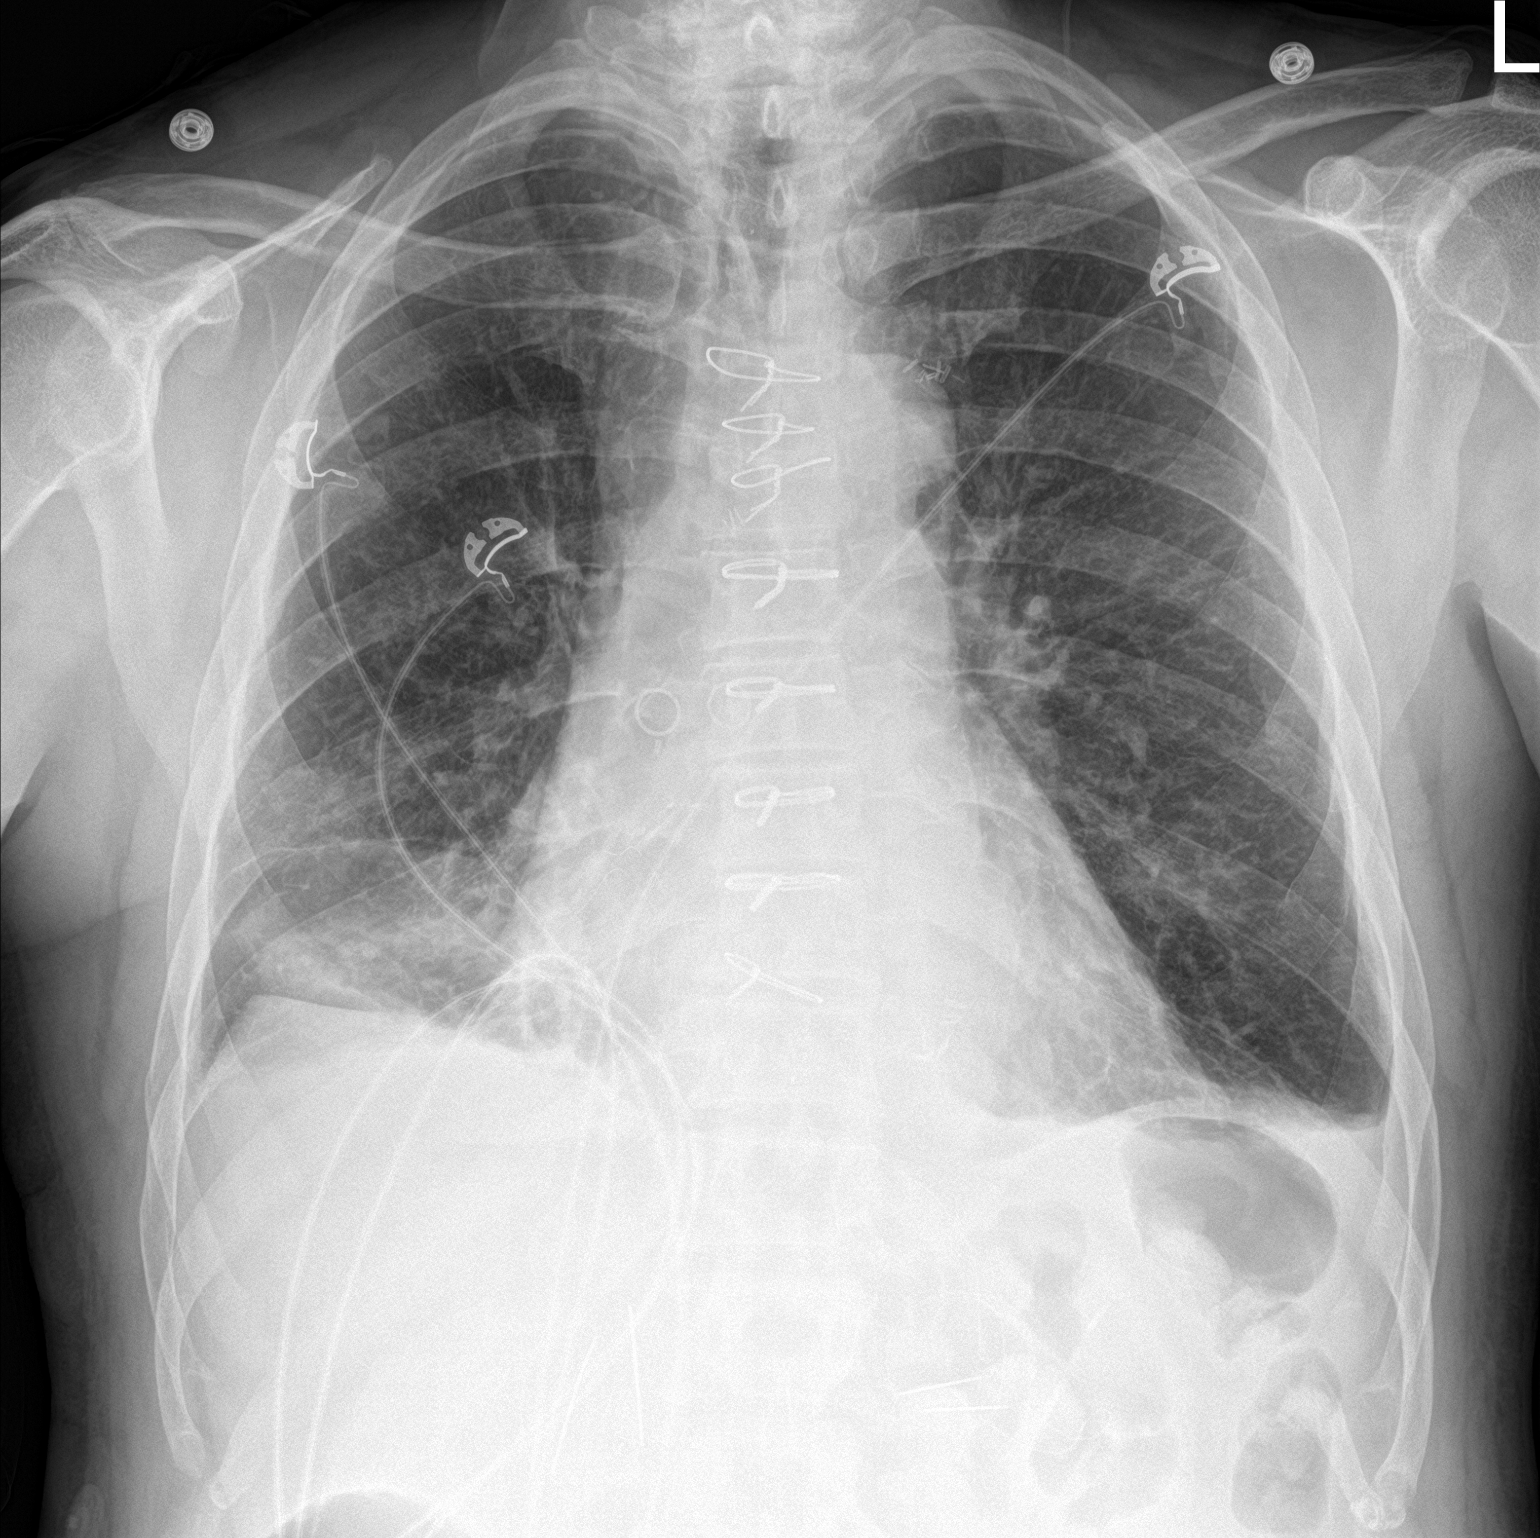

[chest lat]
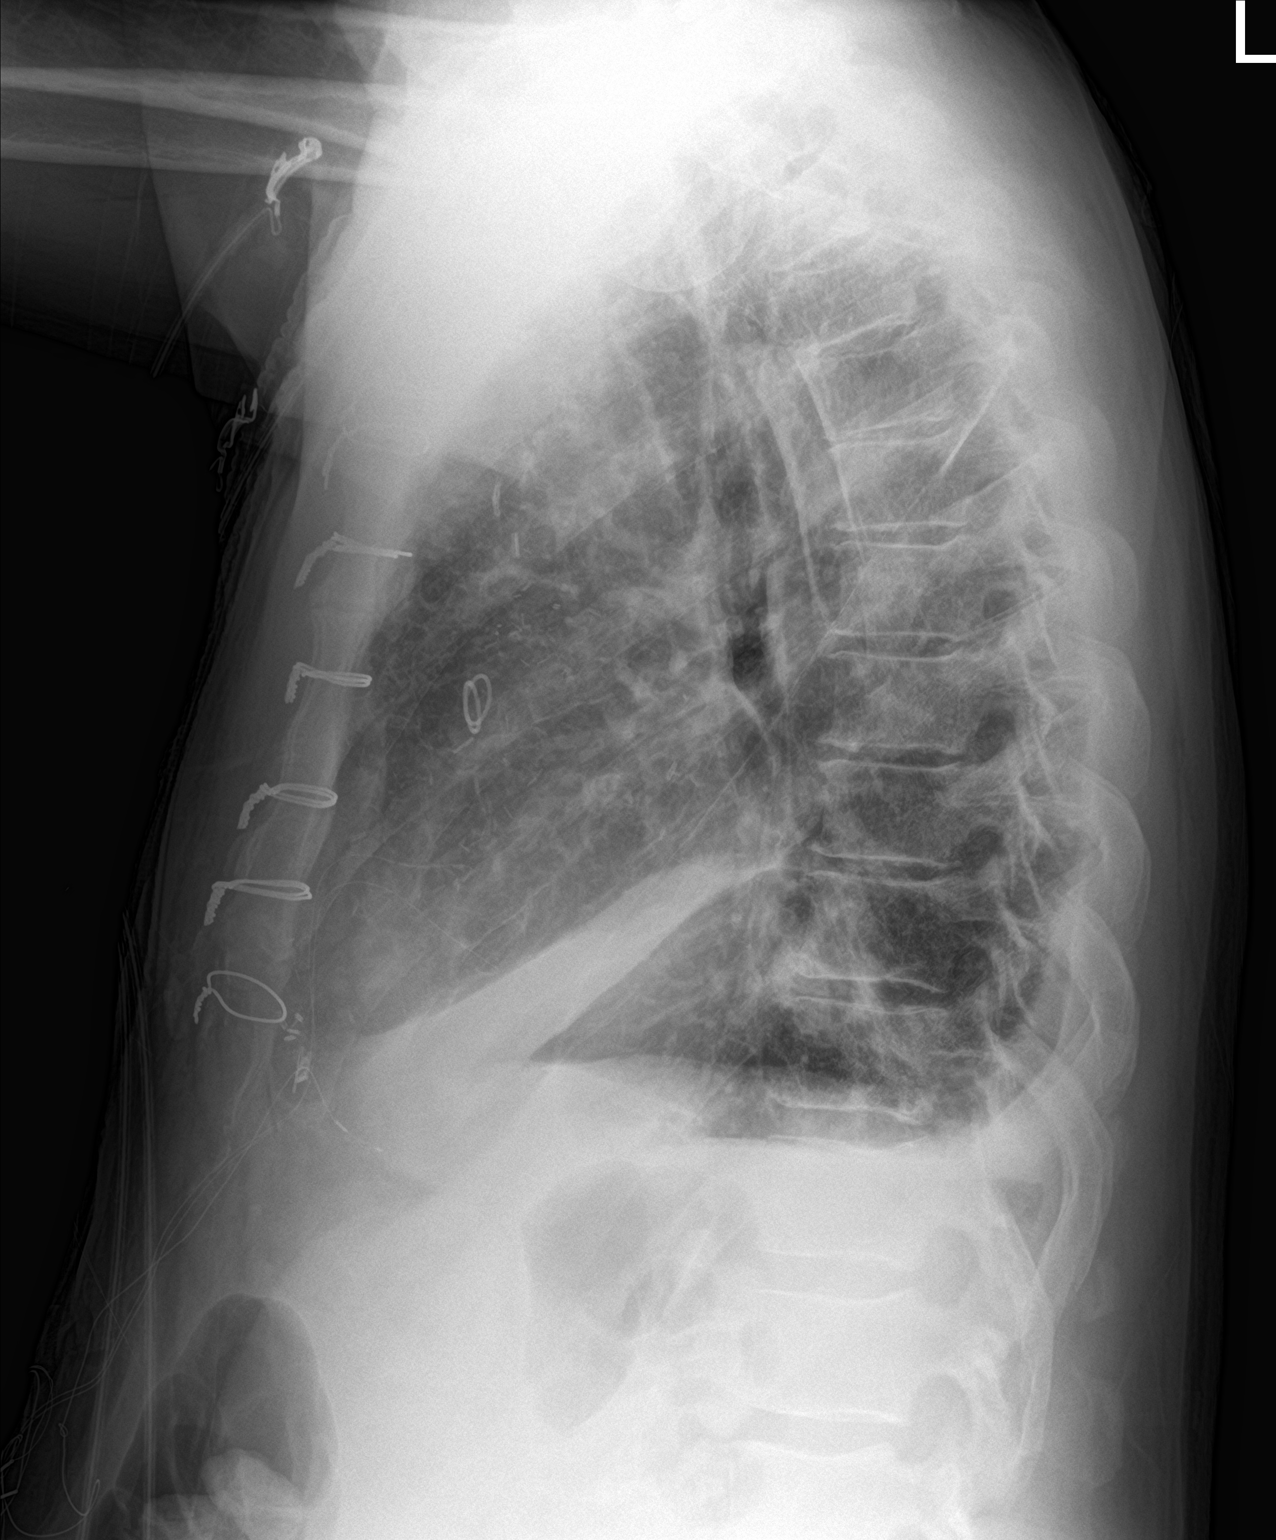

[2 of 2 positions shown; findings below may reference images not displayed]

FINDINGS: Previously noted right IJ Cordis and right-sided chest tube have
both been removed. No appreciable right-sided pneumothorax. Lung
volumes are normal. There are some bibasilar opacities (right
greater than left) which are favored to reflect areas of resolving
postoperative subsegmental atelectasis. Small bilateral pleural
effusions (right greater than left). No definite consolidative
airspace disease. No evidence of pulmonary edema. Heart size is
normal. Upper mediastinal contours are within normal limits. Status
post median sternotomy for CABG.
IMPRESSION: 1. Postoperative changes and support apparatus, as above.
2. Resolving bibasilar subsegmental atelectasis and small bilateral
pleural effusions (right greater than left).

## 2020-06-21 ENCOUNTER — Telehealth: Payer: Self-pay | Admitting: Physician Assistant

## 2020-06-21 ENCOUNTER — Telehealth: Payer: Self-pay | Admitting: *Deleted

## 2020-06-21 ENCOUNTER — Other Ambulatory Visit (HOSPITAL_COMMUNITY)
Admission: RE | Admit: 2020-06-21 | Discharge: 2020-06-21 | Disposition: A | Discharge status: Home / Self Care | Payer: HRSA Program | Source: Ambulatory Visit | Attending: Cardiovascular Disease | Admitting: Cardiovascular Disease

## 2020-06-21 DIAGNOSIS — Z01818 Encounter for other preprocedural examination: Secondary | ICD-10-CM | POA: Diagnosis present

## 2020-06-21 DIAGNOSIS — Z20822 Contact with and (suspected) exposure to covid-19: Secondary | ICD-10-CM | POA: Insufficient documentation

## 2020-06-21 LAB — BASIC METABOLIC PANEL
BUN/Creatinine Ratio: 20 (ref 10–24)
BUN: 34 mg/dL — ABNORMAL HIGH (ref 8–27)
CO2: 26 mmol/L (ref 20–29)
Calcium: 9.6 mg/dL (ref 8.6–10.2)
Chloride: 100 mmol/L (ref 96–106)
Creatinine, Ser: 1.71 mg/dL — ABNORMAL HIGH (ref 0.76–1.27)
GFR calc Af Amer: 49 mL/min/{1.73_m2} — ABNORMAL LOW (ref >59)
GFR calc non Af Amer: 42 mL/min/{1.73_m2} — ABNORMAL LOW (ref >59)
Glucose: 144 mg/dL — ABNORMAL HIGH (ref 65–99)
Potassium: 5.8 mmol/L (ref 3.5–5.2)
Sodium: 137 mmol/L (ref 134–144)

## 2020-06-21 LAB — CBC WITH DIFFERENTIAL/PLATELET
Basophils Absolute: 0.1 10*3/uL (ref 0.0–0.2)
Basos: 1 %
EOS (ABSOLUTE): 0.2 10*3/uL (ref 0.0–0.4)
Eos: 3 %
Hematocrit: 35.7 % — ABNORMAL LOW (ref 37.5–51.0)
Hemoglobin: 12.4 g/dL — ABNORMAL LOW (ref 13.0–17.7)
Immature Grans (Abs): 0 10*3/uL (ref 0.0–0.1)
Immature Granulocytes: 1 %
Lymphocytes Absolute: 1.4 10*3/uL (ref 0.7–3.1)
Lymphs: 27 %
MCH: 32.4 pg (ref 26.6–33.0)
MCHC: 34.7 g/dL (ref 31.5–35.7)
MCV: 93 fL (ref 79–97)
Monocytes Absolute: 0.8 10*3/uL (ref 0.1–0.9)
Monocytes: 15 %
Neutrophils Absolute: 2.7 10*3/uL (ref 1.4–7.0)
Neutrophils: 53 %
Platelets: 199 10*3/uL (ref 150–450)
RBC: 3.83 x10E6/uL — ABNORMAL LOW (ref 4.14–5.80)
RDW: 13.7 % (ref 11.6–15.4)
WBC: 5 10*3/uL (ref 3.4–10.8)

## 2020-06-21 LAB — SARS CORONAVIRUS 2 (TAT 6-24 HRS): SARS Coronavirus 2: NEGATIVE

## 2020-06-21 NOTE — Telephone Encounter (Signed)
Pt contacted pre-PV angiogram scheduled at [facility] for: Monday June 25, 2020 7:30 AM Verified arrival time and place: [facility] Main Entrance A [facility]) at: 5:30 AM   No solid food after midnight prior to cath, clear liquids until 5 AM day of procedure.  Hold: Lasix/KCl-AM of procedure  Except hold medications AM meds can be  taken pre-cath with sips of water including: ASA 81 mg Plavix 75 mg   Confirmed patient has responsible adult to drive home post procedure and be with patient first 24 hours after arriving home: yes  You are allowed ONE visitor in the waiting room during the time you are at the hospital for your procedure. Both you and your visitor must wear a mask once you enter the hospital.       COVID-19 Pre-Screening Questions:   In the past 14 days have you had a new cough, new headache, new nasal congestion, fever (100.4 or greater) unexplained body aches, new sore throat, or sudden loss of taste or sense of smell? no  In the past 14 days have you been around anyone with known Covid 19? no  Have you been vaccinated for COVID-19? Yes, see immunization history   Reviewed procedure/mask/visitor instructions, COVID-19 questions reviewed with patient.

## 2020-06-21 NOTE — Telephone Encounter (Signed)
   [facility] called because of abnl labs. See below.  Called pt.  Requested he hold the Lasix (furosemide), lisinopril and potassium. He will need repeat BMET when he comes in for the procedure.   Will route this to Margorie John, RN and Dr Allyson Sabal.   Lab Results  Component Value Date   WBC WILL FOLLOW 06/21/2020   HGB WILL FOLLOW 06/21/2020   HCT WILL FOLLOW 06/21/2020   MCV WILL FOLLOW 06/21/2020   PLT WILL FOLLOW 06/21/2020   No results for input(s): INR in the last 72 hours.  Recent Labs  Lab 06/21/20 1102  NA 137  K 5.8*  CL 100  CO2 26  BUN 34*  CREATININE 1.71*  CALCIUM 9.6  GLUCOSE 144*   Andreka Stucki, PA-C 06/21/2020 5:37 PM

## 2020-06-22 ENCOUNTER — Telehealth: Payer: Self-pay | Admitting: Physician Assistant

## 2020-06-22 NOTE — Telephone Encounter (Signed)
Spent 23 minutes on the phone w/ pt. Advised of recommendations.  He states he does NOT have a way to check his BP at home. Pt reports that his legs hurt/burn, "they feel like they have been dipped in boiling water". This has progressively worsened since seeing Dr. Allyson Sabal 9/21. He has issues just walking around the house and must stop frequently. Pt would "like something to take the edge off", such as pain medication. Aware I will forward to Dr. Allyson Sabal & Tereso Newcomer, PA for advisement. Understands office will call once we hear advisement.  He would also like a sooner appt if possible, as he does not want to wait until Wednesday to be seen d/t worsening pain/discomfort.

## 2020-06-22 NOTE — Addendum Note (Signed)
Addended by: Neoma Laming on: 06/22/2020 09:25 AM   Modules accepted: Orders

## 2020-06-22 NOTE — Telephone Encounter (Signed)
Order placed for BMP on arrival to hospital for procedure 06/25/20.

## 2020-06-22 NOTE — Telephone Encounter (Addendum)
Spoke to [facility] about abnormal bmet.He advised to cancel PV procedure scheduled Mon 06/25/20.He will be speaking to [facility] PA about adding patient to his schedule next week to evaluate further.Dr.Berry advised to stop taking Lisinopril.Stop taking Lasix and Potassium supplement. Spoke to [place] at cath lab procedure cancelled. Spoke to patient advised PV procedure cancelled on Mon 10/18.Advised to stop taking Lisinopril,Lasix and Potassium supplement. Advised he will be receiving a call from our office with appointment to evaluate bmet further.

## 2020-06-22 NOTE — Telephone Encounter (Signed)
I was contacted by Dr. Allyson Sabal today about his pre procedure labs for upcoming LE angiogram. His creatinine is newly elevated.  His Lisinopril, Lasix and K+ have been held.  PLAN:  Please schedule him with me on Wed 10/20 at 4:15 to follow up. We will need to repeat a BMET while he is here.   If he can check his BP at home, check it daily and bring in his readings. Drink plenty of fluids over the weekend (water). Tereso Newcomer, PA-C    06/22/2020 2:13 PM

## 2020-06-22 NOTE — Telephone Encounter (Signed)
I need to see him to evaluate his worsening renal function.  I am not in the office Mon or Tues.  I do not have any openings on Wed but can add him in at 415.  We can check his BMET again Monday to see how his creatinine responds to hold his ACE, diuretic and K+.  If he is ok with that, arrange a BMET on Monday.  If Dr. Tenny Craw or any other APP (even if not on our team) has an opening, we can have him see one of them. I will have to defer any medical therapy for his claudication to Dr. Allyson Sabal or pain management to his PCP. Tereso Newcomer, PA-C    06/22/2020 4:32 PM

## 2020-06-24 NOTE — Telephone Encounter (Signed)
As soon as his K and renal function are addressed  I'm happy to reschedule his PV angio  JJB

## 2020-06-25 ENCOUNTER — Ambulatory Visit (HOSPITAL_COMMUNITY): Admission: RE | Admit: 2020-06-25 | Payer: Self-pay | Source: Home / Self Care | Admitting: Cardiovascular Disease

## 2020-06-25 ENCOUNTER — Encounter (HOSPITAL_COMMUNITY): Admission: RE | Payer: Self-pay | Source: Home / Self Care

## 2020-06-25 SURGERY — PELVIC ANGIOGRAPHY
Anesthesia: LOCAL | Laterality: Bilateral

## 2020-06-27 ENCOUNTER — Other Ambulatory Visit: Payer: Self-pay

## 2020-06-27 ENCOUNTER — Encounter: Payer: Self-pay | Admitting: Physician Assistant

## 2020-06-27 ENCOUNTER — Ambulatory Visit (INDEPENDENT_AMBULATORY_CARE_PROVIDER_SITE_OTHER): Payer: Self-pay | Admitting: Physician Assistant

## 2020-06-27 VITALS — BP 138/64 | HR 74 | Ht 71.0 in | Wt 178.2 lb

## 2020-06-27 DIAGNOSIS — I739 Peripheral vascular disease, unspecified: Secondary | ICD-10-CM

## 2020-06-27 DIAGNOSIS — I10 Essential (primary) hypertension: Secondary | ICD-10-CM

## 2020-06-27 DIAGNOSIS — I2510 Atherosclerotic heart disease of native coronary artery without angina pectoris: Secondary | ICD-10-CM

## 2020-06-27 DIAGNOSIS — E119 Type 2 diabetes mellitus without complications: Secondary | ICD-10-CM

## 2020-06-27 DIAGNOSIS — N179 Acute kidney failure, unspecified: Secondary | ICD-10-CM

## 2020-06-27 DIAGNOSIS — Z720 Tobacco use: Secondary | ICD-10-CM

## 2020-06-27 NOTE — Progress Notes (Signed)
Cardiology Office Note:    Date:  06/27/2020   ID:  Karl Garcia, DOB Feb 06, 1961, MRN [id number]  PCP:  Patient, No Pcp Per  [facility] Cardiologist:  Karl Pates, MD   [facility] Electrophysiologist:  None  PV:  Karl Batty, MD  Referring MD: No ref. provider found   Chief Complaint:  Follow-up (Acute kidney injury)    Patient Profile:    Karl Garcia is a 59 y.o. male with:   Coronary artery disease ? S/p NSTEMI6/2021 >> s/p CABG ? Echocardiogram 6/21: EF 55-60, Gr 2 DD, LAE  Hypertension   Hyperlipidemia   Diabetes mellitus   Peripheral Arterial Disease  L subclavian stenosis    Tobacco abuse  Alcohol abuse   Prior CV studies: Pre-CABG Dopplers 02/16/2020 Bilateral ICA 1-39; left subclavian stenosis ABIs: R 0.64; L0.86  Echocardiogram 02/16/2020 EF 55-60, normal wall motion, mild LVH, GRII DD, normal RV SF, moderate LAE, trivial MR  Cardiac catheterization 02/16/2020 LM ostial 25 LAD ostial 70, mid 90, distal 90 RI 90 LCx proximal 60 RCA proximal 65-45-50  History of Present Illness:    Karl Garcia was last seen in 8/21.  Since then, he has been evaluated by Karl Garcia for peripheral arterial disease.  He has lifestyle limiting claudication with evidence of an occluded R CIA and mod L CIA stenosis on arterial doppler studies.  Peripheral angiography was planned.  However, his pre-procedure labs indicated AKI.  His ACE inhibitor, furosemide and K+ were held.  He returns for further evaluation.  He is here alone.  Overall, he is feeling well aside from lifestyle limiting claudication.  He has not had chest pain, shortness of breath, syncope, orthopnea, leg swelling.  He does not take NSAIDs.  He has never had kidney issues.        Past Medical History:  Diagnosis Date  . Coronary artery disease    S/p NSTEMI 02/2020 >> s/p CABG // Echocardiogram 6/21: EF 55-60, Gr 2 DD, LAE  . Diabetes mellitus Type 2   . Essential hypertension     . ETOH abuse   . Hx of NSTEMI in 02/2020   . Hyperlipidemia   . L subclavian stenosis   . Peripheral Arterial Disease   . Tobacco abuse     Current Medications: Current Meds  Medication Sig  . acetaminophen (TYLENOL) 500 MG tablet Take 2 tablets (1,000 mg total) by mouth every 6 (six) hours. (Patient taking differently: Take 1,000 mg by mouth every 6 (six) hours as needed for moderate pain or headache. )  . aspirin 81 MG tablet Take 1 tablet (81 mg total) by mouth daily.  Marland Kitchen atorvastatin (LIPITOR) 80 MG tablet Take 1 tablet (80 mg total) by mouth daily.  . clopidogrel (PLAVIX) 75 MG tablet Take 1 tablet (75 mg total) by mouth daily.  Marland Kitchen LORazepam (ATIVAN) 1 MG tablet Take 1 tablet (1 mg total) by mouth 2 (two) times daily as needed for anxiety.  . metoprolol succinate (TOPROL XL) 25 MG 24 hr tablet Take 1 tablet (25 mg total) by mouth daily.   Current Facility-Administered Medications for the 06/27/20 encounter (Office Visit) with Karl Garcia T, PA-C  Medication  . sodium chloride flush (NS) 0.9 % injection 3 mL     Allergies:   Morphine and related   Social History   Tobacco Use  . Smoking status: Current Every Day Smoker    Packs/day: 1.50  . Smokeless tobacco: Never Used  Vaping Use  .  Vaping Use: Never used  Substance Use Topics  . Alcohol use: Yes    Comment: 1 pint of liquor per day   . Drug use: Never     Family Hx: The patient's family history includes Healthy in his brother; Heart attack in his father and mother; Heart failure in his father.  ROS   EKGs/Labs/Other Test Reviewed:    EKG:  EKG is not ordered today.  The ekg ordered today demonstrates n/a  Recent Labs: 02/17/2020: TSH 1.563 02/18/2020: Magnesium 2.1 04/17/2020: ALT 17 06/21/2020: BUN 34; Creatinine, Ser 1.71; Hemoglobin 12.4; Platelets 199; Potassium 5.8; Sodium 137   Recent Lipid Panel Lab Results  Component Value Date/Time   CHOL 125 04/17/2020 01:41 PM   TRIG 63 04/17/2020 01:41 PM    HDL 47 04/17/2020 01:41 PM   CHOLHDL 2.7 04/17/2020 01:41 PM   CHOLHDL 3.6 02/20/2020 11:55 AM   LDLCALC 65 04/17/2020 01:41 PM      Risk Assessment/Calculations:      Physical Exam:    VS:  BP 138/64   Pulse 74   Ht 5\' 11"  (1.803 m)   Wt 178 lb 3.2 oz (80.8 kg)   SpO2 95%   BMI 24.85 kg/m     Wt Readings from Last 3 Encounters:  06/27/20 178 lb 3.2 oz (80.8 kg)  05/29/20 170 lb 9.6 oz (77.4 kg)  05/16/20 173 lb 6.4 oz (78.7 kg)     Constitutional:      Appearance: Healthy appearance. Not in distress.  Neck:     Vascular: JVD normal.  Pulmonary:     Effort: Pulmonary effort is normal.     Breath sounds: No wheezing. No rales.  Cardiovascular:     Normal rate. Regular rhythm. Normal S1. Normal S2.     Murmurs: There is no murmur.  Edema:    Peripheral edema absent.  Abdominal:     General: There is abdominal bruit.     Palpations: Abdomen is soft.  Skin:    General: Skin is warm and dry.  Neurological:     General: No focal deficit present.     Mental Status: Alert and oriented to person, place and time.     Cranial Nerves: Cranial nerves are intact.      ASSESSMENT & PLAN:    1. AKI (acute kidney injury) (HCC) As noted, his creatinine increased from 0.7 in August to 1.71 last week.  His ACE inhibitor, diuretic and potassium have all been placed on hold.  He has been hydrating well at home.  He does not take NSAIDs or use any other nephrotoxic agents.  I suspect his elevated creatinine was related to ACE inhibitor therapy in the setting of overdiuresis with furosemide.  He does have an abdominal bruit on exam.  This may be referred from his known disease in his iliacs.  However, I will set him up for renal arterial Dopplers to rule out the possibility of renal artery stenosis.  We will obtain a follow-up BMET today.  Hopefully, his renal function will improve soon so that he can return to see Dr. September and undergo planned peripheral angiogram.  2. Peripheral  arterial disease (HCC) As noted, he has lifestyle limiting claudication.  3. Coronary artery disease involving native coronary artery of native heart without angina pectoris History of non-STEMI in 6/21 followed by CABG.  He is doing well without anginal symptoms.  Continue aspirin, clopidogrel, atorvastatin, metoprolol succinate.  4. Essential hypertension BP controlled.  Have asked  him to monitor his blood pressure now that he is off of lisinopril.  If his blood pressure remains above goal, we could potentially place him on amlodipine.  5.  Diabetes mellitus Follow-up with primary care.  If his renal function returns to normal, we could consider placing him on empagliflozin given CV benefit. Dispo:  Return in about 8 weeks (around 08/22/2020) for Routine Follow Up, w/ Karl Newcomer, PA-C, in person.   Medication Adjustments/Labs and Tests Ordered: Current medicines are reviewed at length with the patient today.  Concerns regarding medicines are outlined above.  Tests Ordered: Orders Placed This Encounter  Procedures  . Basic metabolic panel  . VAS US RENAL ARTERY DUPLEX   Medication Changes: No orders of the defined types were placed in this encounter.   Signed, Karl Newcomer, PA-C  06/27/2020 5:01 PM    [facility] [address] [place], [place], [place]  [address] Phone: [phone]; Fax: [phone]

## 2020-06-27 NOTE — Patient Instructions (Signed)
Medication Instructions:  Your physician recommends that you continue on your current medications as directed. Please refer to the Current Medication list given to you today.  *If you need a refill on your cardiac medications before your next appointment, please call your pharmacy*  Lab Work: You will have labs drawn today: BMET  Testing/Procedures: Your physician has requested that you have a renal artery duplex. During this test, an ultrasound is used to evaluate blood flow to the kidneys. Allow one hour for this exam. Do not eat after midnight the day before and avoid carbonated beverages. Take your medications as you usually do.  Follow-Up: At [facility], you and your health needs are our priority.  As part of our continuing mission to provide you with exceptional heart care, we have created designated Provider Care Teams.  These Care Teams include your primary Cardiologist (physician) and Advanced Practice Providers (APPs -  Physician Assistants and Nurse Practitioners) who all work together to provide you with the care you need, when you need it.  Your next appointment:   6-8 week(s)  The format for your next appointment:   In Person  Provider:   Tereso Newcomer, PA-C

## 2020-06-28 ENCOUNTER — Telehealth: Payer: Self-pay

## 2020-06-28 LAB — BASIC METABOLIC PANEL
BUN/Creatinine Ratio: 15 (ref 10–24)
BUN: 10 mg/dL (ref 8–27)
CO2: 20 mmol/L (ref 20–29)
Calcium: 9.2 mg/dL (ref 8.6–10.2)
Chloride: 102 mmol/L (ref 96–106)
Creatinine, Ser: 0.67 mg/dL — ABNORMAL LOW (ref 0.76–1.27)
GFR calc Af Amer: 120 mL/min/{1.73_m2} (ref >59)
GFR calc non Af Amer: 104 mL/min/{1.73_m2} (ref >59)
Glucose: 96 mg/dL (ref 65–99)
Potassium: 3.8 mmol/L (ref 3.5–5.2)
Sodium: 141 mmol/L (ref 134–144)

## 2020-06-28 NOTE — Telephone Encounter (Signed)
-----   Message from Beatrice Lecher, [place] sent at 06/28/2020  7:39 AM EDT ----- Renal function returned to normal.  K+ normal.   We need to keep an eye on his BP now that he is off of Lisinopril.  He may need to take something else in its place. PLAN:  - Continue current medications/treatment plan and follow up as scheduled. - Pt should monitor BP at home for 2 weeks and send readings for review. - If he is unable to get a BP cuff, please provide him one through the patient fund. - I will fwd to Dr. Allyson Sabal as well.  Tereso Newcomer, PA-C    06/28/2020 7:36 AM

## 2020-06-28 NOTE — Telephone Encounter (Signed)
Called and made patient aware Karl Garcia comments below. Patient aware that he should be contacted by Dr. Hazle Coca nurse in the near future to reschedule angiogram. He verbalized understanding and thanked me for the call.

## 2020-06-28 NOTE — Telephone Encounter (Signed)
He does not need lower extremity arterial Dopplers right now. He was to have an angiogram with Dr. Allyson Sabal but it was cancelled due to his elevated creatinine. The lower extremity dopplers that are ordered were to be done after his procedure as f/u.  His angiogram is a hospital procedure. So, the renal artery dopplers cannot be done the same day.  I sent a copy of his labs to Dr. Allyson Sabal. So, I'm sure his nurse will be contacting him  soon to reschedule his angiogram.  Tereso Newcomer, PA-C 11:55 06/28/2020

## 2020-06-28 NOTE — Telephone Encounter (Signed)
Call placed to pt.  Advised of [employer] plan.   He will pick up blood pressure cuff this afternoon and monitor BP for 2 weeks and bring to office.  --Pt is asking about rescheduling the tests for his legs.  He would like to do it the same day as his renal study of possible.  Advised would send message to SW to advise on timing of leg dopplers.

## 2020-06-29 ENCOUNTER — Telehealth: Payer: Self-pay | Admitting: *Deleted

## 2020-06-29 IMAGING — DX DG CHEST 1V PORT
1 series · 1 of 1 positions shown · non-contrast
Comparison: 02/20/2020

CLINICAL DATA: CABG.

EXAM:
PORTABLE CHEST 1 VIEW

[chest ap]
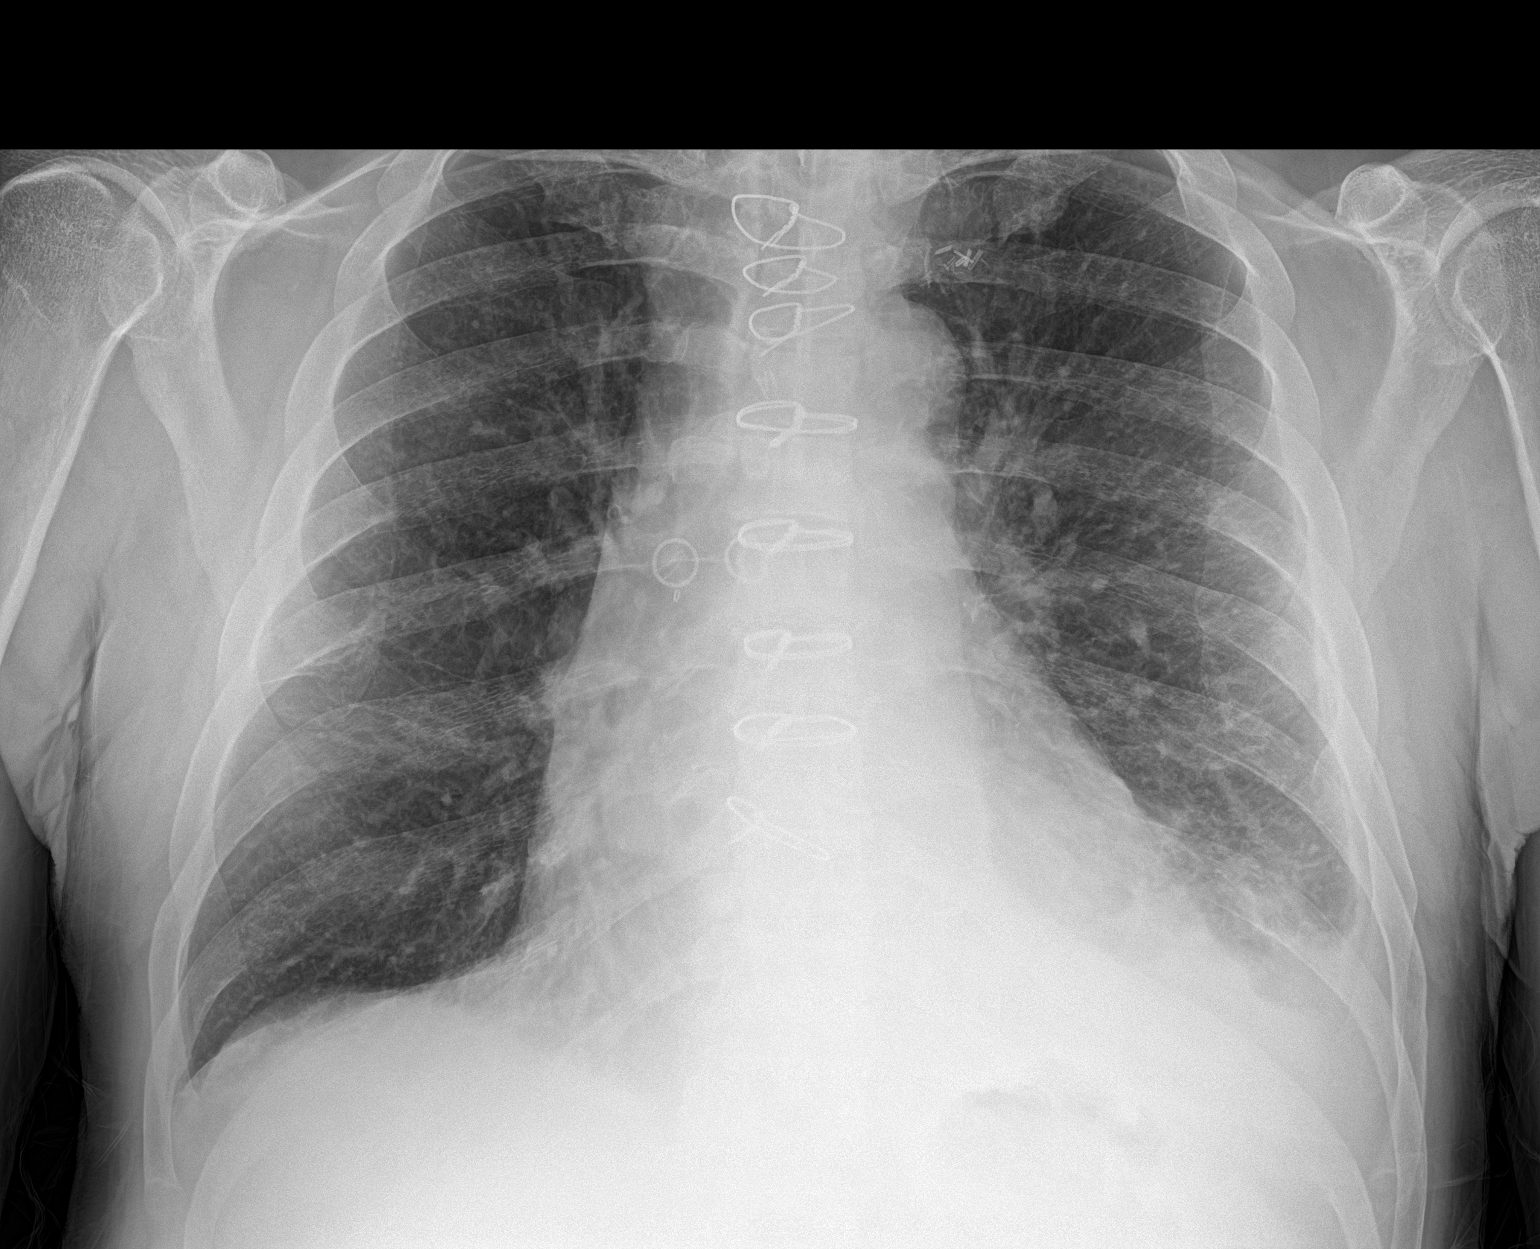

[1 of 1 positions shown; findings below may reference images not displayed]

FINDINGS: 1067 hours. Cardiopericardial silhouette is at upper limits of
normal for size. Slight progression of left base atelectasis with
small left pleural effusion similar to prior. Interval resolution of
the patchy airspace opacity at the right base. No substantial right
pleural effusion. The visualized bony structures of the thorax are
intact.
IMPRESSION: 1. Interval resolution of patchy airspace disease at the right base.
2. Persistent small left pleural effusion with slight increase in
left base atelectasis.

## 2020-06-29 NOTE — Telephone Encounter (Signed)
Spoke with pt, pv angio rescheduled to 07-16-20 @ 7:30 am. He will have covid testing 07-12-20. He is aware of the instructions, directions and appointments. He will call with questions.

## 2020-06-29 NOTE — Telephone Encounter (Signed)
-----   Message from Runell Gess, MD sent at 06/29/2020  6:49 AM EDT ----- Labs have normalized. Need to resched PV angio with approp reps ----- Message ----- From: Freddi Starr, RN Sent: 06/28/2020   2:24 PM EDT To: Runell Gess, MD  He is going to need an updated H&P, do you want to see him in the office to reschedule or can you update your note from 05-29-20?

## 2020-07-02 ENCOUNTER — Ambulatory Visit (HOSPITAL_COMMUNITY)
Admission: RE | Admit: 2020-07-02 | Payer: Self-pay | Source: Ambulatory Visit | Attending: Cardiovascular Disease | Admitting: Cardiovascular Disease

## 2020-07-02 ENCOUNTER — Ambulatory Visit (HOSPITAL_COMMUNITY): Payer: Self-pay

## 2020-07-10 ENCOUNTER — Ambulatory Visit: Payer: Self-pay | Admitting: Cardiovascular Disease

## 2020-07-11 ENCOUNTER — Ambulatory Visit: Payer: Self-pay | Admitting: Physician Assistant

## 2020-07-12 ENCOUNTER — Other Ambulatory Visit (HOSPITAL_COMMUNITY)
Admission: RE | Admit: 2020-07-12 | Discharge: 2020-07-12 | Disposition: A | Discharge status: Home / Self Care | Payer: HRSA Program | Source: Ambulatory Visit | Attending: Cardiovascular Disease | Admitting: Cardiovascular Disease

## 2020-07-12 ENCOUNTER — Telehealth: Payer: Self-pay | Admitting: *Deleted

## 2020-07-12 DIAGNOSIS — Z20822 Contact with and (suspected) exposure to covid-19: Secondary | ICD-10-CM | POA: Insufficient documentation

## 2020-07-12 DIAGNOSIS — Z01818 Encounter for other preprocedural examination: Secondary | ICD-10-CM | POA: Diagnosis present

## 2020-07-12 LAB — SARS CORONAVIRUS 2 (TAT 6-24 HRS): SARS Coronavirus 2: NEGATIVE

## 2020-07-12 NOTE — Telephone Encounter (Signed)
Pt contacted pre-abdominal aortogram  scheduled at [facility] for: Monday July 16, 2020 7:30 AM Verified arrival time and place: [facility] Main Entrance [facility]) at: 5:30 AM   No solid food after midnight prior to cath, clear liquids until 5 AM day of procedure.  AM meds can be  taken pre-cath with sips of water including: ASA 81 mg Plavix 75 mg  Confirmed patient has responsible adult to drive home post procedure and be with patient first 24 hours after arriving home: yes  You are allowed ONE visitor in the waiting room during the time you are at the hospital for your procedure. Both you and your visitor must wear a mask once you enter the hospital.       COVID-19 Pre-Screening Questions:   In the past 14 days have you had a new cough, new headache, new nasal congestion, fever (100.4 or greater) unexplained body aches, new sore throat, or sudden loss of taste or sense of smell? no  In the past 14 days have you been around anyone with known Covid 19? No   Reviewed procedure/mask/visitor instructions, COVID-19 questions with patient.

## 2020-07-13 NOTE — H&P (Signed)
07/13/2020 [name]   1960-09-27  [id number]  Primary Physician Patient, No Pcp Per Primary Cardiologist: Runell Gess MD Milagros Loll, [place], [place]  HPI:  Karl Garcia is a 59 y.o.   thin appearing single Caucasian male with no children who works as a Writer was referred by Tereso Newcomer, PA-C for evaluation of left subclavian artery stenosis and PAD.   I last saw him in the office 04/12/2020.  His risk factors include 60-pack-year tobacco abuse currently smoking 1/2 pack/day as well as hyperlipidemia. He presented with chest pain and non-STEMI on 02/16/2020. I performed diagnostic coronary angiography via the right radial approach revealing three-vessel disease. Ultimately underwent CABG x3 the following day by Dr. Maren Beach with a LIMA to his LAD, vein graft to intermediate branch and to the PDA. Postop course was unremarkable. Recuperated nicely. He denies chest pain or shortness of breath or claudication. There is no evidence of subclavian steal.  I performed lower extremity arterial Doppler studies on him 05/23/2020 revealing a noncompressive right ABI and a left ABI of 0.54.  He had an occluded right common iliac and moderate left common iliac artery stenosis.  He does have lifestyle limiting claudication.  Unfortunately he does continue to smoke as well.  We will schedule him for angiography and endovascular therapy sometime within the next 3 to 4 weeks.   Active Medications      Current Meds  Medication Sig  . acetaminophen (TYLENOL) 500 MG tablet Take 2 tablets (1,000 mg total) by mouth every 6 (six) hours.  Marland Kitchen aspirin 81 MG tablet Take 1 tablet (81 mg total) by mouth daily.  Marland Kitchen atorvastatin (LIPITOR) 80 MG tablet Take 1 tablet (80 mg total) by mouth daily.  . clopidogrel (PLAVIX) 75 MG tablet Take 1 tablet (75 mg total) by mouth daily.  . furosemide (LASIX) 40 MG tablet Take 1 tablet (40 mg total) by mouth daily.  Marland Kitchen lisinopril (ZESTRIL) 20 MG tablet Take 1  tablet (20 mg total) by mouth daily.  Marland Kitchen LORazepam (ATIVAN) 1 MG tablet Take 1 tablet (1 mg total) by mouth 2 (two) times daily as needed for anxiety.  . metoprolol succinate (TOPROL XL) 25 MG 24 hr tablet Take 1 tablet (25 mg total) by mouth daily.  . mometasone-formoterol (DULERA) 100-5 MCG/ACT AERO Inhale 2 puffs into the lungs 2 (two) times daily.   . potassium chloride SA (KLOR-CON) 20 MEQ tablet Take 1 tablet (20 mEq total) by mouth daily.           Allergies  Allergen Reactions  . Morphine And Related Rash    Social History        Socioeconomic History  . Marital status: Single    Spouse name: Not on file  . Number of children: Not on file  . Years of education: Not on file  . Highest education level: Not on file  Occupational History  . Not on file  Tobacco Use  . Smoking status: Current Every Day Smoker    Packs/day: 1.50  . Smokeless tobacco: Never Used  Vaping Use  . Vaping Use: Never used  Substance and Sexual Activity  . Alcohol use: Yes    Comment: 1 pint of liquor per day   . Drug use: Never  . Sexual activity: Not on file  Other Topics Concern  . Not on file  Social History Narrative  . Not on file   Social Determinants of Health      Financial  Resource Strain:   . Difficulty of Paying Living Expenses: Not on file  Food Insecurity:   . Worried About Programme researcher, broadcasting/film/video in the Last Year: Not on file  . Ran Out of Food in the Last Year: Not on file  Transportation Needs:   . Lack of Transportation (Medical): Not on file  . Lack of Transportation (Non-Medical): Not on file  Physical Activity:   . Days of Exercise per Week: Not on file  . Minutes of Exercise per Session: Not on file  Stress:   . Feeling of Stress : Not on file  Social Connections:   . Frequency of Communication with Friends and Family: Not on file  . Frequency of Social Gatherings with Friends and Family: Not on file  . Attends Religious Services: Not on file  .  Active Member of Clubs or Organizations: Not on file  . Attends Banker Meetings: Not on file  . Marital Status: Not on file  Intimate Partner Violence:   . Fear of Current or Ex-Partner: Not on file  . Emotionally Abused: Not on file  . Physically Abused: Not on file  . Sexually Abused: Not on file     Review of Systems: General: negative for chills, fever, night sweats or weight changes.  Cardiovascular: negative for chest pain, dyspnea on exertion, edema, orthopnea, palpitations, paroxysmal nocturnal dyspnea or shortness of breath Dermatological: negative for rash Respiratory: negative for cough or wheezing Urologic: negative for hematuria Abdominal: negative for nausea, vomiting, diarrhea, bright red blood per rectum, melena, or hematemesis Neurologic: negative for visual changes, syncope, or dizziness All other systems reviewed and are otherwise negative except as noted above.    Blood pressure 118/76, pulse 72, height 5\' 11"  (1.803 m), weight 170 lb 9.6 oz (77.4 kg), SpO2 99 %.  General appearance: alert and no distress Neck: no adenopathy, no carotid bruit, no JVD, supple, symmetrical, trachea midline and thyroid not enlarged, symmetric, no tenderness/mass/nodules Lungs: clear to auscultation bilaterally Heart: regular rate and rhythm, S1, S2 normal, no murmur, click, rub or gallop Extremities: extremities normal, atraumatic, no cyanosis or edema Pulses: Diminished pedal pulses Skin: Skin color, texture, turgor normal. No rashes or lesions Neurologic: Alert and oriented X 3, normal strength and tone. Normal symmetric reflexes. Normal coordination and gait  EKG not performed today  ASSESSMENT AND PLAN:   Peripheral arterial disease (HCC) History of symptomatic PAD with bilateral hip buttock and lower extremity claudication.  He had Doppler studies performed 05/23/2020 revealing an occluded right iliac with moderate left iliac disease.  He wishes to  proceed with angiography and endovascular therapy for lifestyle limiting claudication.   05/25/2020, M.D., FACP, [facility], [facility] [address] [place], [place]  [place]  [phone] 07/13/2020 4:30 PM

## 2020-07-16 ENCOUNTER — Other Ambulatory Visit: Payer: Self-pay

## 2020-07-16 ENCOUNTER — Ambulatory Visit (HOSPITAL_COMMUNITY)
Admission: RE | Admit: 2020-07-16 | Discharge: 2020-07-16 | Disposition: A | Discharge status: Home / Self Care | Payer: Self-pay | Attending: Cardiovascular Disease | Admitting: Cardiovascular Disease

## 2020-07-16 ENCOUNTER — Encounter (HOSPITAL_COMMUNITY)
Admission: RE | Disposition: A | Discharge status: Home / Self Care | Payer: Self-pay | Source: Home / Self Care | Attending: Cardiovascular Disease

## 2020-07-16 ENCOUNTER — Encounter (HOSPITAL_COMMUNITY): Payer: Self-pay | Admitting: Cardiovascular Disease

## 2020-07-16 DIAGNOSIS — Z79899 Other long term (current) drug therapy: Secondary | ICD-10-CM | POA: Insufficient documentation

## 2020-07-16 DIAGNOSIS — I739 Peripheral vascular disease, unspecified: Secondary | ICD-10-CM

## 2020-07-16 DIAGNOSIS — Z951 Presence of aortocoronary bypass graft: Secondary | ICD-10-CM | POA: Insufficient documentation

## 2020-07-16 DIAGNOSIS — F17210 Nicotine dependence, cigarettes, uncomplicated: Secondary | ICD-10-CM | POA: Insufficient documentation

## 2020-07-16 DIAGNOSIS — I252 Old myocardial infarction: Secondary | ICD-10-CM | POA: Insufficient documentation

## 2020-07-16 DIAGNOSIS — E1151 Type 2 diabetes mellitus with diabetic peripheral angiopathy without gangrene: Secondary | ICD-10-CM | POA: Insufficient documentation

## 2020-07-16 DIAGNOSIS — Z7982 Long term (current) use of aspirin: Secondary | ICD-10-CM | POA: Insufficient documentation

## 2020-07-16 DIAGNOSIS — E785 Hyperlipidemia, unspecified: Secondary | ICD-10-CM | POA: Insufficient documentation

## 2020-07-16 DIAGNOSIS — I70213 Atherosclerosis of native arteries of extremities with intermittent claudication, bilateral legs: Secondary | ICD-10-CM | POA: Insufficient documentation

## 2020-07-16 HISTORY — PX: ABDOMINAL AORTOGRAM W/LOWER EXTREMITY: CATH118223

## 2020-07-16 LAB — GLUCOSE, CAPILLARY
Glucose-Capillary: 129 mg/dL — ABNORMAL HIGH (ref 70–99)
Glucose-Capillary: 119 mg/dL — ABNORMAL HIGH (ref 70–99)

## 2020-07-16 SURGERY — ABDOMINAL AORTOGRAM W/LOWER EXTREMITY
Anesthesia: LOCAL | Laterality: Bilateral

## 2020-07-16 MED ORDER — ONDANSETRON HCL 4 MG/2ML IJ SOLN: 4.0000 mg | Freq: Four times a day (QID) | INTRAMUSCULAR | Status: DC | PRN

## 2020-07-16 MED ORDER — CLOPIDOGREL BISULFATE 75 MG PO TABS: 75.0000 mg | ORAL_TABLET | ORAL | Status: DC

## 2020-07-16 MED ORDER — SODIUM CHLORIDE 0.9 % WEIGHT BASED INFUSION
3.0000 mL/kg/h | INTRAVENOUS | Status: AC
  Administered 2020-07-16: 3 mL/kg/h via INTRAVENOUS

## 2020-07-16 MED ORDER — ACETAMINOPHEN 325 MG PO TABS: 650.0000 mg | ORAL_TABLET | ORAL | Status: DC | PRN

## 2020-07-16 MED ORDER — SODIUM CHLORIDE 0.9% FLUSH: 3.0000 mL | Freq: Two times a day (BID) | INTRAVENOUS | Status: DC

## 2020-07-16 MED ORDER — HEPARIN (PORCINE) IN NACL 1000-0.9 UT/500ML-% IV SOLN
INTRAVENOUS | Status: AC
  Filled 2020-07-16: qty 1000

## 2020-07-16 MED ORDER — LABETALOL HCL 5 MG/ML IV SOLN: 10.0000 mg | INTRAVENOUS | Status: DC | PRN

## 2020-07-16 MED ORDER — SODIUM CHLORIDE 0.9 % IV SOLN: 250.0000 mL | INTRAVENOUS | Status: DC | PRN

## 2020-07-16 MED ORDER — HEPARIN (PORCINE) IN NACL 1000-0.9 UT/500ML-% IV SOLN
INTRAVENOUS | Status: DC | PRN
  Administered 2020-07-16 (×2): 500 mL

## 2020-07-16 MED ORDER — IODIXANOL 320 MG/ML IV SOLN
INTRAVENOUS | Status: DC | PRN
  Administered 2020-07-16: 115 mL via INTRA_ARTERIAL

## 2020-07-16 MED ORDER — ASPIRIN EC 81 MG PO TBEC: 81.0000 mg | DELAYED_RELEASE_TABLET | Freq: Every day | ORAL | Status: DC

## 2020-07-16 MED ORDER — LIDOCAINE HCL (PF) 1 % IJ SOLN
INTRAMUSCULAR | Status: DC | PRN
  Administered 2020-07-16: 30 mL via INTRADERMAL

## 2020-07-16 MED ORDER — HYDRALAZINE HCL 20 MG/ML IJ SOLN: 5.0000 mg | INTRAMUSCULAR | Status: DC | PRN

## 2020-07-16 MED ORDER — SODIUM CHLORIDE 0.9% FLUSH: 3.0000 mL | INTRAVENOUS | Status: DC | PRN

## 2020-07-16 MED ORDER — SODIUM CHLORIDE 0.9 % IV SOLN: INTRAVENOUS | Status: AC

## 2020-07-16 MED ORDER — MIDAZOLAM HCL 2 MG/2ML IJ SOLN
INTRAMUSCULAR | Status: DC | PRN
  Administered 2020-07-16: 1 mg via INTRAVENOUS

## 2020-07-16 MED ORDER — ATORVASTATIN CALCIUM 80 MG PO TABS
80.0000 mg | ORAL_TABLET | Freq: Every day | ORAL | Status: DC
  Filled 2020-07-16: qty 1

## 2020-07-16 MED ORDER — CLOPIDOGREL BISULFATE 75 MG PO TABS
ORAL_TABLET | ORAL | Status: AC
  Filled 2020-07-16: qty 1

## 2020-07-16 MED ORDER — LIDOCAINE HCL (PF) 1 % IJ SOLN
INTRAMUSCULAR | Status: AC
  Filled 2020-07-16: qty 30

## 2020-07-16 MED ORDER — MIDAZOLAM HCL 2 MG/2ML IJ SOLN
INTRAMUSCULAR | Status: AC
  Filled 2020-07-16: qty 2

## 2020-07-16 MED ORDER — FENTANYL CITRATE (PF) 100 MCG/2ML IJ SOLN
INTRAMUSCULAR | Status: DC | PRN
  Administered 2020-07-16: 25 ug via INTRAVENOUS

## 2020-07-16 MED ORDER — ASPIRIN 81 MG PO CHEW: 81.0000 mg | CHEWABLE_TABLET | ORAL | Status: DC

## 2020-07-16 MED ORDER — SODIUM CHLORIDE 0.9 % WEIGHT BASED INFUSION: 1.0000 mL/kg/h | INTRAVENOUS | Status: DC

## 2020-07-16 MED ORDER — FENTANYL CITRATE (PF) 100 MCG/2ML IJ SOLN
INTRAMUSCULAR | Status: AC
  Filled 2020-07-16: qty 2

## 2020-07-16 SURGICAL SUPPLY — 9 items
CATH ANGIO 5F PIGTAIL 65CM (CATHETERS) ×2 IMPLANT
KIT PV (KITS) ×2 IMPLANT
SHEATH PINNACLE 5F 10CM (SHEATH) ×2 IMPLANT
STOPCOCK MORSE 400PSI 3WAY (MISCELLANEOUS) ×2 IMPLANT
SYR MEDRAD MARK 7 150ML (SYRINGE) ×2 IMPLANT
TRANSDUCER W/STOPCOCK (MISCELLANEOUS) ×2 IMPLANT
TRAY PV CATH (CUSTOM PROCEDURE TRAY) ×2 IMPLANT
TUBING CIL FLEX 10 FLL-RA (TUBING) ×2 IMPLANT
WIRE HITORQ VERSACORE ST 145CM (WIRE) ×2 IMPLANT

## 2020-07-16 NOTE — Consult Note (Signed)
Hospital Consult    Reason for Consult:  Lifestyle limiting claudication Referring Physician:  Dr. Allyson Sabal MRN #:  [id number]  History of Present Illness: This is a 59 y.o. male long history of smoking also have hyperlipidemia and diabetes.  Recently underwent CABG in June of this year.  He has very short distance claudication can approximately walk 50 yards prior to cramping.  He does not have any tissue loss or ulceration.  He has never had any intervention on his bilateral lower extremities.  He did undergo diagnostic angiography today.  He is on aspirin, Plavix and statin.  Past Medical History:  Diagnosis Date  . Coronary artery disease    S/p NSTEMI 02/2020 >> s/p CABG // Echocardiogram 6/21: EF 55-60, Gr 2 DD, LAE  . Diabetes mellitus Type 2   . Essential hypertension   . ETOH abuse   . Hx of NSTEMI in 02/2020   . Hyperlipidemia   . L subclavian stenosis   . Peripheral Arterial Disease   . Tobacco abuse     Past Surgical History:  Procedure Laterality Date  . CORONARY ARTERY BYPASS GRAFT N/A 02/17/2020   Procedure: CORONARY ARTERY BYPASS GRAFTING (CABG) x Three, using left internal mammry artery and right leg greater saphenous vein harvested endoscopically;  Surgeon: Kerin Perna, MD;  Location[facility] OR;  Service: Open Heart Surgery;  Laterality: N/A;  . LEFT HEART CATH AND CORONARY ANGIOGRAPHY N/A 02/16/2020   Procedure: LEFT HEART CATH AND CORONARY ANGIOGRAPHY;  Surgeon: Runell Gess, MD;  Location: [facility] INVASIVE CV LAB;  Service: Cardiovascular;  Laterality: N/A;  . TEE WITHOUT CARDIOVERSION N/A 02/17/2020   Procedure: TRANSESOPHAGEAL ECHOCARDIOGRAM (TEE);  Surgeon: Donata Clay, Theron Arista, MD;  Location: [facility] OR;  Service: Open Heart Surgery;  Laterality: N/A;  . TONSILECTOMY, ADENOIDECTOMY, BILATERAL MYRINGOTOMY AND TUBES     unsure of all of that but + tonsilectomy    Allergies  Allergen Reactions  . Morphine And Related Rash    Prior to Admission medications   Medication  Sig Start Date End Date Taking? Authorizing Provider  acetaminophen (TYLENOL) 500 MG tablet Take 2 tablets (1,000 mg total) by mouth every 6 (six) hours. Patient taking differently: Take 1,000 mg by mouth every 6 (six) hours as needed for moderate pain or headache.  02/21/20  Yes Sharlene Dory, PA-C  aspirin 81 MG tablet Take 1 tablet (81 mg total) by mouth daily. 03/13/20  Yes Weaver, Scott T, PA-C  atorvastatin (LIPITOR) 80 MG tablet Take 1 tablet (80 mg total) by mouth daily. 05/21/20  Yes Pricilla Riffle, MD  clopidogrel (PLAVIX) 75 MG tablet Take 1 tablet (75 mg total) by mouth daily. 03/13/20  Yes Weaver, Scott T, PA-C  metoprolol succinate (TOPROL XL) 25 MG 24 hr tablet Take 1 tablet (25 mg total) by mouth daily. 05/16/20  Yes Kerin Perna, MD  LORazepam (ATIVAN) 1 MG tablet Take 1 tablet (1 mg total) by mouth 2 (two) times daily as needed for anxiety. 02/21/20   Sharlene Dory, PA-C    Social History   Socioeconomic History  . Marital status: Single    Spouse name: Not on file  . Number of children: Not on file  . Years of education: Not on file  . Highest education level: Not on file  Occupational History  . Not on file  Tobacco Use  . Smoking status: Current Every Day Smoker    Packs/day: 1.50  . Smokeless tobacco: Never Used  Vaping  Use  . Vaping Use: Never used  Substance and Sexual Activity  . Alcohol use: Yes    Comment: 1 pint of liquor per day   . Drug use: Never  . Sexual activity: Not on file  Other Topics Concern  . Not on file  Social History Narrative  . Not on file   Social Determinants of Health   Financial Resource Strain:   . Difficulty of Paying Living Expenses: Not on file  Food Insecurity:   . Worried About Programme researcher, broadcasting/film/video in the Last Year: Not on file  . Ran Out of Food in the Last Year: Not on file  Transportation Needs:   . Lack of Transportation (Medical): Not on file  . Lack of Transportation (Non-Medical): Not on file  Physical Activity:     . Days of Exercise per Week: Not on file  . Minutes of Exercise per Session: Not on file  Stress:   . Feeling of Stress : Not on file  Social Connections:   . Frequency of Communication with Friends and Family: Not on file  . Frequency of Social Gatherings with Friends and Family: Not on file  . Attends Religious Services: Not on file  . Active Member of Clubs or Organizations: Not on file  . Attends Banker Meetings: Not on file  . Marital Status: Not on file  Intimate Partner Violence:   . Fear of Current or Ex-Partner: Not on file  . Emotionally Abused: Not on file  . Physically Abused: Not on file  . Sexually Abused: Not on file     Family History  Problem Relation Age of Onset  . Heart attack Mother   . Heart attack Father   . Heart failure Father   . Healthy Brother     ROS:  Cardiovascular: []  chest pain/pressure []  palpitations []  SOB lying flat []  DOE [x]  pain in legs while walking []  pain in legs at rest []  pain in legs at night []  non-healing ulcers []  hx of DVT []  swelling in legs  Pulmonary: []  productive cough []  asthma/wheezing []  home O2  Neurologic: []  weakness in []  arms []  legs []  numbness in []  arms []  legs []  hx of CVA []  mini stroke [] difficulty speaking or slurred speech []  temporary loss of vision in one eye []  dizziness  Hematologic: []  hx of cancer []  bleeding problems []  problems with blood clotting easily  Endocrine:   []  diabetes []  thyroid disease  GI []  vomiting blood []  blood in stool  GU: []  CKD/renal failure []  HD--[]  M/W/F or []  T/T/S []  burning with urination []  blood in urine  Psychiatric: []  anxiety []  depression  Musculoskeletal: []  arthritis []  joint pain  Integumentary: []  rashes []  ulcers  Constitutional: []  fever []  chills   Physical Examination  Vitals:   07/16/20 0850 07/16/20 0858  BP: (!) 159/84 (!) 158/80  Pulse: (!) 59 (!) 59  Resp: 17 16  Temp:    SpO2: 95% 96%    Body mass index is 24.41 kg/m.  General:  nad Pulmonary: normal non-labored breathing Cardiac: No palpable common femoral pulses Abdomen:  soft, NT/ND, no masses Extremities: He does not have any tissue loss or ulceration Neurologic: A&O X 3; Appropriate Affect ; SENSATION: normal; MOTOR FUNCTION:  moving all extremities equally. Speech is fluent/normal   CBC    Component Value Date/Time   WBC 5.0 06/21/2020 1102   WBC 12.5 (H) 03/01/2020 1800   RBC 3.83 (L)  06/21/2020 1102   RBC 3.00 (L) 03/01/2020 1800   HGB 12.4 (L) 06/21/2020 1102   HCT 35.7 (L) 06/21/2020 1102   PLT 199 06/21/2020 1102   MCV 93 06/21/2020 1102   MCH 32.4 06/21/2020 1102   MCH 33.7 03/01/2020 1800   MCHC 34.7 06/21/2020 1102   MCHC 32.3 03/01/2020 1800   RDW 13.7 06/21/2020 1102   LYMPHSABS 1.4 06/21/2020 1102   MONOABS 1.2 (H) 09/23/2013 1835   EOSABS 0.2 06/21/2020 1102   BASOSABS 0.1 06/21/2020 1102    BMET    Component Value Date/Time   NA 141 06/27/2020 1649   K 3.8 06/27/2020 1649   CL 102 06/27/2020 1649   CO2 20 06/27/2020 1649   GLUCOSE 96 06/27/2020 1649   GLUCOSE 114 (H) 03/01/2020 1800   BUN 10 06/27/2020 1649   CREATININE 0.67 (L) 06/27/2020 1649   CALCIUM 9.2 06/27/2020 1649   GFRNONAA 104 06/27/2020 1649   GFRAA 120 06/27/2020 1649    COAGS: Lab Results  Component Value Date   INR 1.3 (H) 02/17/2020   INR 0.96 09/23/2013     Non-Invasive Vascular Imaging:   ABI Findings:  +---------+------------------+-----+--------+------------+  Right  Rt Pressure (mmHg)IndexWaveformComment     +---------+------------------+-----+--------+------------+  Brachial 98                       +---------+------------------+-----+--------+------------+  ATA               absent undetectable  +---------+------------------+-----+--------+------------+  PTA               absent undetectable   +---------+------------------+-----+--------+------------+  PERO               absent undetectable  +---------+------------------+-----+--------+------------+  Great Toe            Absent         +---------+------------------+-----+--------+------------+   +---------+------------------+-----+----------+-------+  Left   Lt Pressure (mmHg)IndexWaveform Comment  +---------+------------------+-----+----------+-------+  Brachial 48                      +---------+------------------+-----+----------+-------+  ATA   19        0.19 monophasic      +---------+------------------+-----+----------+-------+  PTA   50        0.51 monophasic      +---------+------------------+-----+----------+-------+  PERO   53        0.54 monophasic      +---------+------------------+-----+----------+-------+  Great Toe21        0.21 Abnormal       +---------+------------------+-----+----------+-------+   ASSESSMENT/PLAN: This is a 59 y.o. male with life limiting claudication underwent angiography today which demonstrates occlusion of his right common external leg arteries and significant disease on the left.  I discussed with him the necessity to stop smoking urgently.  I have also discussed with he and his girlfriend his possibilities moving forward.  Given the short distance nature of his claudication and his severely depressed ABIs I do think he is at some risk to develop ulcers in the future however I discussed with him that claudication very infrequently leads to amputation.  His only options for improvement of blood flow to include aortobifemoral bypass.  At this time patien needs more time to think about this we will set him up to come back to the office and discuss in 3 to 4 weeks with me.  No further studies are necessary.    Vallorie Niccoli C. Randie Heinz, MD [facility] [place] [facility] Office: [phone] Pager: [phone]

## 2020-07-16 NOTE — Progress Notes (Signed)
Patient and wife was given discharge instructions. Both verbalized understanding. 

## 2020-07-16 NOTE — Interval H&P Note (Signed)
History and Physical Interval Note:  07/16/2020 7:34 AM  [name]  has presented today for surgery, with the diagnosis of PAD.  The various methods of treatment have been discussed with the patient and family. After consideration of risks, benefits and other options for treatment, the patient has consented to  Procedure(s): ABDOMINAL AORTOGRAM W/LOWER EXTREMITY (Bilateral) as a surgical intervention.  The patient's history has been reviewed, patient examined, no change in status, stable for surgery.  I have reviewed the patient's chart and labs.  Questions were answered to the patient's satisfaction.     Nanetta Batty

## 2020-07-16 NOTE — Progress Notes (Signed)
Site area: Left groin a 5 french arterial sheath was removed by Geanie Kenning RN  Site Prior to Removal:  Level 0  Pressure Applied For 20 MINUTES    Bedrest Beginning at 0900am  Manual:   Yes.    Patient Status During Pull:  stable  Post Pull Groin Site:  Level 0  Post Pull Instructions Given:  Yes.    Post Pull Pulses Present:  Yes.    Dressing Applied:  Yes.    Comments:

## 2020-07-16 NOTE — Discharge Instructions (Signed)
Femoral Site Care This sheet gives you information about how to care for yourself after your procedure. Your health care provider may also give you more specific instructions. If you have problems or questions, contact your health care provider. What can I expect after the procedure? After the procedure, it is common to have:  Bruising that usually fades within 1-2 weeks.  Tenderness at the site. Follow these instructions at home: Wound care  Follow instructions from your health care provider about how to take care of your insertion site. Make sure you: ? Wash your hands with soap and water before you change your bandage (dressing). If soap and water are not available, use hand sanitizer. ? Change your dressing as told by your health care provider. ? Leave stitches (sutures), skin glue, or adhesive strips in place. These skin closures may need to stay in place for 2 weeks or longer. If adhesive strip edges start to loosen and curl up, you may trim the loose edges. Do not remove adhesive strips completely unless your health care provider tells you to do that.  Do not take baths, swim, or use a hot tub until your health care provider approves.  You may shower 24-48 hours after the procedure or as told by your health care provider. ? Gently wash the site with plain soap and water. ? Pat the area dry with a clean towel. ? Do not rub the site. This may cause bleeding.  Do not apply powder or lotion to the site. Keep the site clean and dry.  Check your femoral site every day for signs of infection. Check for: ? Redness, swelling, or pain. ? Fluid or blood. ? Warmth. ? Pus or a bad smell. Activity  For the first 2-3 days after your procedure, or as long as directed: ? Avoid climbing stairs as much as possible. ? Do not squat.  Do not lift anything that is heavier than 10 lb (4.5 kg), or the limit that you are told, until your health care provider says that it is safe.  Rest as  directed. ? Avoid sitting for a long time without moving. Get up to take short walks every 1-2 hours.  Do not drive for 24 hours if you were given a medicine to help you relax (sedative). General instructions  Take over-the-counter and prescription medicines only as told by your health care provider.  Keep all follow-up visits as told by your health care provider. This is important. Contact a health care provider if you have:  A fever or chills.  You have redness, swelling, or pain around your insertion site. Get help right away if:  The catheter insertion area swells very fast.  You pass out.  You suddenly start to sweat or your skin gets clammy.  The catheter insertion area is bleeding, and the bleeding does not stop when you hold steady pressure on the area.  The area near or just beyond the catheter insertion site becomes pale, cool, tingly, or numb. These symptoms may represent a serious problem that is an emergency. Do not wait to see if the symptoms will go away. Get medical help right away. Call your local emergency services (911 in the [place] Do not drive yourself to the hospital. Summary  After the procedure, it is common to have bruising that usually fades within 1-2 weeks.  Check your femoral site every day for signs of infection.  Do not lift anything that is heavier than 10 lb (4.5 kg), or the   limit that you are told, until your health care provider says that it is safe. This information is not intended to replace advice given to you by your health care provider. Make sure you discuss any questions you have with your health care provider. Document Revised: 09/07/2017 Document Reviewed: 09/07/2017 Elsevier Patient Education  2020 [employer]  

## 2020-07-16 NOTE — Progress Notes (Signed)
Pt drank three cups of coffee this morning. Reported to Janne Napoleon RN

## 2020-07-18 ENCOUNTER — Telehealth: Payer: Self-pay

## 2020-07-18 NOTE — Telephone Encounter (Signed)
Patient called in stating he was returning a call. Was in chart looking for who called - patient hung up.

## 2020-07-19 ENCOUNTER — Encounter (HOSPITAL_COMMUNITY): Payer: Self-pay | Admitting: Cardiovascular Disease

## 2020-07-19 ENCOUNTER — Encounter (HOSPITAL_COMMUNITY): Payer: Self-pay

## 2020-07-23 ENCOUNTER — Encounter (HOSPITAL_COMMUNITY): Payer: Self-pay

## 2020-07-26 ENCOUNTER — Ambulatory Visit (HOSPITAL_COMMUNITY): Payer: Self-pay

## 2020-07-26 ENCOUNTER — Inpatient Hospital Stay (HOSPITAL_COMMUNITY): Admission: RE | Admit: 2020-07-26 | Payer: Self-pay | Source: Ambulatory Visit

## 2020-08-01 ENCOUNTER — Ambulatory Visit: Payer: Self-pay | Admitting: Cardiovascular Disease

## 2020-08-10 ENCOUNTER — Encounter: Payer: Self-pay | Admitting: Vascular Surgery

## 2020-08-14 NOTE — Progress Notes (Signed)
Cardiology Office Note:    Date:  08/15/2020   ID:  [name], DOB 03/23/1961, MRN [id number]  PCP:  Patient, No Pcp Per  [facility] Cardiologist:  Dietrich Pates, MD   [facility] Electrophysiologist:  None   Referring MD: No ref. provider found   Chief Complaint:  Follow-up (CAD)    Patient Profile:    Karl Garcia is a 59 y.o. male with:   Coronary artery disease ? S/p NSTEMI6/2021>> s/p CABG ? Echocardiogram 6/21: EF 55-60, Gr 2 DD, LAE  Hypertension   Hyperlipidemia   Diabetes mellitus   Peripheral Arterial Disease  L subclavian stenosis  Tobacco abuse  Alcohol abuse   Prior CV studies: Pre-CABG Dopplers 02/16/2020 Bilateral ICA 1-39; left subclavian stenosis ABIs: R 0.64; L0.86  Echocardiogram 02/16/2020 EF 55-60, normal wall motion, mild LVH, GRII DD, normal RV SF, moderate LAE, trivial MR  Cardiac catheterization 02/16/2020 LM ostial 25 LAD ostial 70, mid 90, distal 90 RI 90 LCx proximal 60 RCA proximal 65-45-50  History of Present Illness:    Mr. Sabino was last seen in 06/2020.  He had been taken off of his ACEi and diuretic due to worsening creatinine.  This improved back to normal.  Since then, he had his peripheral angiogram. He has severe disease and needs aorto-bifem bypass and has been referred to Dr. Randie Heinz.  He returns for f/u.  He is here alone.  He notes that he has not heard anything from vascular surgery yet.  He is eager to get surgery because he continues to have issues with life style limiting claudication.  He has not had exertional chest discomfort, significant shortness of breath, syncope, orthopnea, leg swelling.  He does have an occasional left-sided chest pain that is brief and only lasts seconds.  It is not related to activity.  I have asked him to notify us if this should change or worsen.      Past Medical History:  Diagnosis Date  . Coronary artery disease    S/p NSTEMI 02/2020 >> s/p CABG //  Echocardiogram 6/21: EF 55-60, Gr 2 DD, LAE  . Diabetes mellitus Type 2   . Essential hypertension   . ETOH abuse   . Hx of NSTEMI in 02/2020   . Hyperlipidemia   . L subclavian stenosis   . Peripheral Arterial Disease   . Tobacco abuse     Current Medications: Current Meds  Medication Sig  . acetaminophen (TYLENOL) 500 MG tablet Take 500 mg by mouth as needed for moderate pain or headache.  Marland Kitchen aspirin 81 MG tablet Take 1 tablet (81 mg total) by mouth daily.  Marland Kitchen atorvastatin (LIPITOR) 80 MG tablet Take 1 tablet (80 mg total) by mouth daily.  . clopidogrel (PLAVIX) 75 MG tablet Take 1 tablet (75 mg total) by mouth daily.  . metoprolol succinate (TOPROL XL) 25 MG 24 hr tablet Take 1 tablet (25 mg total) by mouth daily.   Current Facility-Administered Medications for the 08/15/20 encounter (Office Visit) with Tereso Newcomer T, PA-C  Medication  . sodium chloride flush (NS) 0.9 % injection 3 mL     Allergies:   Morphine and related   Social History   Tobacco Use  . Smoking status: Current Every Day Smoker    Packs/day: 1.50  . Smokeless tobacco: Never Used  Vaping Use  . Vaping Use: Never used  Substance Use Topics  . Alcohol use: Yes    Comment: 1 pint of liquor per day   .  Drug use: Never     Family Hx: The patient's family history includes Healthy in his brother; Heart attack in his father and mother; Heart failure in his father.  ROS   EKGs/Labs/Other Test Reviewed:    EKG:  EKG is not ordered today.  The ekg ordered today demonstrates n/a  Recent Labs: 02/17/2020: TSH 1.563 02/18/2020: Magnesium 2.1 04/17/2020: ALT 17 06/21/2020: Hemoglobin 12.4; Platelets 199 06/27/2020: BUN 10; Creatinine, Ser 0.67; Potassium 3.8; Sodium 141   Recent Lipid Panel Lab Results  Component Value Date/Time   CHOL 125 04/17/2020 01:41 PM   TRIG 63 04/17/2020 01:41 PM   HDL 47 04/17/2020 01:41 PM   CHOLHDL 2.7 04/17/2020 01:41 PM   CHOLHDL 3.6 02/20/2020 11:55 AM   LDLCALC 65  04/17/2020 01:41 PM      Risk Assessment/Calculations:      Physical Exam:    VS:  BP (!) 138/50   Ht 6' (1.829 m)   Wt 186 lb (84.4 kg)   SpO2 95%   BMI 25.23 kg/m     Wt Readings from Last 3 Encounters:  08/15/20 186 lb (84.4 kg)  07/16/20 175 lb (79.4 kg)  06/27/20 178 lb 3.2 oz (80.8 kg)     Constitutional:      Appearance: Healthy appearance. Not in distress.  Pulmonary:     Effort: Pulmonary effort is normal.     Breath sounds: No wheezing. No rales.  Cardiovascular:     Normal rate. Regular rhythm. Normal S1. Normal S2.     Murmurs: There is no murmur.  Edema:    Peripheral edema absent.  Abdominal:     Palpations: Abdomen is soft.  Musculoskeletal:     Cervical back: Neck supple. Skin:    General: Skin is warm and dry.  Neurological:     General: No focal deficit present.     Mental Status: Alert and oriented to person, place and time.     Cranial Nerves: Cranial nerves are intact.       ASSESSMENT & PLAN:    1. Coronary artery disease involving native coronary artery of native heart without angina pectoris History of non-STEMI in 6/21 followed by CABG. he is doing well without anginal symptoms.  Continue aspirin, clopidogrel, atorvastatin, metoprolol succinate.  He will return for follow-up in 6 months.  He will be 1 year out from his MI at that point consider stopping his clopidogrel.  2. Peripheral arterial disease (HCC) He has significant bilateral disease with lifestyle limiting claudication.  He has seen vascular surgery for consultation.  We did contact Dr. Darcella Cheshire office to make sure the patient has follow-up.  He has not made it to his scheduled appts.  We will ask the patient to call to schedule f/u when he can make it.    3. Essential hypertension Creatinine returned to normal after stopping ACE inhibitor and diuretic.  Blood pressure somewhat borderline.  He had a lot of caffeine before coming in today.  Have asked him to continue to monitor  his blood pressure at home and notify us if his pressure is above target.  4. Mixed hyperlipidemia LDL optimal on most recent lab work.  Continue current Rx.     Dispo:  Return in about 6 months (around 02/13/2021) for Routine Follow Up, w/ Dr. Tenny Craw, or Tereso Newcomer, PA-C, in person.   Medication Adjustments/Labs and Tests Ordered: Current medicines are reviewed at length with the patient today.  Concerns regarding medicines are outlined above.  Tests Ordered: No orders of the defined types were placed in this encounter.  Medication Changes: No orders of the defined types were placed in this encounter.   Signed, Tereso Newcomer, PA-C  08/15/2020 1:28 PM    [facility] [address] [place], [place], [place]  [address] Phone: [phone]; Fax: [phone]

## 2020-08-15 ENCOUNTER — Encounter: Payer: Self-pay | Admitting: Physician Assistant

## 2020-08-15 ENCOUNTER — Ambulatory Visit (INDEPENDENT_AMBULATORY_CARE_PROVIDER_SITE_OTHER): Payer: Self-pay | Admitting: Physician Assistant

## 2020-08-15 ENCOUNTER — Other Ambulatory Visit: Payer: Self-pay | Admitting: Cardiothoracic Surgery

## 2020-08-15 ENCOUNTER — Other Ambulatory Visit: Payer: Self-pay

## 2020-08-15 VITALS — BP 138/50 | Ht 72.0 in | Wt 186.0 lb

## 2020-08-15 DIAGNOSIS — I2510 Atherosclerotic heart disease of native coronary artery without angina pectoris: Secondary | ICD-10-CM

## 2020-08-15 DIAGNOSIS — E782 Mixed hyperlipidemia: Secondary | ICD-10-CM

## 2020-08-15 DIAGNOSIS — I739 Peripheral vascular disease, unspecified: Secondary | ICD-10-CM

## 2020-08-15 DIAGNOSIS — I10 Essential (primary) hypertension: Secondary | ICD-10-CM

## 2020-08-15 NOTE — Patient Instructions (Signed)
Medication Instructions:  Your physician recommends that you continue on your current medications as directed. Please refer to the Current Medication list given to you today.  *If you need a refill on your cardiac medications before your next appointment, please call your pharmacy*  Lab Work: None ordered today  Testing/Procedures: None ordered today  Follow-Up: At [facility], you and your health needs are our priority.  As part of our continuing mission to provide you with exceptional heart care, we have created designated Provider Care Teams.  These Care Teams include your primary Cardiologist (physician) and Advanced Practice Providers (APPs -  Physician Assistants and Nurse Practitioners) who all work together to provide you with the care you need, when you need it.  Your next appointment:   6 month(s)  The format for your next appointment:   In Person  Provider:   You may see Dietrich Pates, MD or Tereso Newcomer, PA-C

## 2020-08-21 ENCOUNTER — Telehealth: Payer: Self-pay | Admitting: Physician Assistant

## 2020-08-21 ENCOUNTER — Ambulatory Visit (HOSPITAL_COMMUNITY)
Admission: RE | Admit: 2020-08-21 | Payer: Self-pay | Source: Ambulatory Visit | Attending: Physician Assistant | Admitting: Physician Assistant

## 2020-08-21 NOTE — Telephone Encounter (Signed)
Karl Garcia is calling stating he is confused as to why he was scheduled for a Renal test today. He states he was under the impression he did not need to return for 6 months and thought he needed to speak with Dr. Randie Heinz before having his renal performed. Please advise.

## 2020-08-21 NOTE — Telephone Encounter (Signed)
Spoke to the patient and he said he canceled his renal artery duplex today. He did this because he remembers being told after some other testing that he no longer needed this test anymore but can't remember if Karl Garcia or Karl Garcia team told him this.   Please advise if test needs to be rescheduled for patient (ordered on 10/20) or if it is okay not to have test completed.

## 2020-08-21 NOTE — Telephone Encounter (Signed)
I don't think he needs renal dopplers

## 2020-08-21 NOTE — Telephone Encounter (Signed)
Called the patient and let him know that via Dr. Allyson Sabal he does not need the renal dopplers.  Patient verbalized understanding.   Canceled request in epic.

## 2020-08-21 NOTE — Telephone Encounter (Signed)
Thanks [name]. Karl Garcia

## 2020-10-26 ENCOUNTER — Encounter: Payer: Self-pay | Admitting: Vascular Surgery

## 2020-10-26 ENCOUNTER — Ambulatory Visit (INDEPENDENT_AMBULATORY_CARE_PROVIDER_SITE_OTHER): Payer: Self-pay | Admitting: Vascular Surgery

## 2020-10-26 ENCOUNTER — Other Ambulatory Visit: Payer: Self-pay

## 2020-10-26 VITALS — BP 165/81 | HR 85 | Temp 98.3°F | Resp 20 | Ht 72.0 in | Wt 186.7 lb

## 2020-10-26 DIAGNOSIS — I739 Peripheral vascular disease, unspecified: Secondary | ICD-10-CM

## 2020-10-26 NOTE — Progress Notes (Signed)
Patient ID: Karl Garcia, male   DOB: 08/10/1961, 60 y.o.   MRN: [id number]  Reason for Consult: New Patient (Initial Visit)   Referred by No ref. provider found  Subjective:     HPI:  Karl Garcia is a 60 y.o. male with long history of smoking with other risk factors hyperlipidemia and diabetes.  Underwent CABG last year.  I recently saw him after angiography for claudication.  He states that while he usually does have claudication at less than 50 yards occasionally he can walk 100 yards at a time.  Claudication occurs in both of his hips and his calves but not at the same time.  He does not have erectile dysfunction.  He does not have tissue loss or ulceration.  He denies any history of stroke, TIA or amaurosis.  He is on aspirin, Plavix and a statin.  Past Medical History:  Diagnosis Date  . Coronary artery disease    S/p NSTEMI 02/2020 >> s/p CABG // Echocardiogram 6/21: EF 55-60, Gr 2 DD, LAE  . Diabetes mellitus Type 2   . Essential hypertension   . ETOH abuse   . Hx of NSTEMI in 02/2020   . Hyperlipidemia   . L subclavian stenosis   . Peripheral Arterial Disease   . Tobacco abuse    Family History  Problem Relation Age of Onset  . Heart attack Mother   . Heart attack Father   . Heart failure Father   . Healthy Brother    Past Surgical History:  Procedure Laterality Date  . ABDOMINAL AORTOGRAM W/LOWER EXTREMITY Bilateral 07/16/2020   Procedure: ABDOMINAL AORTOGRAM W/LOWER EXTREMITY;  Surgeon: Runell Gess, MD;  Location: [facility] INVASIVE CV LAB;  Service: Cardiovascular;  Laterality: Bilateral;  . CORONARY ARTERY BYPASS GRAFT N/A 02/17/2020   Procedure: CORONARY ARTERY BYPASS GRAFTING (CABG) x Three, using left internal mammry artery and right leg greater saphenous vein harvested endoscopically;  Surgeon: Kerin Perna, MD;  Location: [facility] OR;  Service: Open Heart Surgery;  Laterality: N/A;  . LEFT HEART CATH AND CORONARY ANGIOGRAPHY N/A 02/16/2020   Procedure: LEFT  HEART CATH AND CORONARY ANGIOGRAPHY;  Surgeon: Runell Gess, MD;  Location: [facility] INVASIVE CV LAB;  Service: Cardiovascular;  Laterality: N/A;  . TEE WITHOUT CARDIOVERSION N/A 02/17/2020   Procedure: TRANSESOPHAGEAL ECHOCARDIOGRAM (TEE);  Surgeon: Donata Clay, Theron Arista, MD;  Location: [facility] OR;  Service: Open Heart Surgery;  Laterality: N/A;  . TONSILECTOMY, ADENOIDECTOMY, BILATERAL MYRINGOTOMY AND TUBES     unsure of all of that but + tonsilectomy    Short Social History:  Social History   Tobacco Use  . Smoking status: Current Every Day Smoker    Packs/day: 0.50  . Smokeless tobacco: Never Used  Substance Use Topics  . Alcohol use: Yes    Comment: 1 pint of liquor per day     Allergies  Allergen Reactions  . Morphine And Related Rash    Current Outpatient Medications  Medication Sig Dispense Refill  . acetaminophen (TYLENOL) 500 MG tablet Take 500 mg by mouth as needed for moderate pain or headache.    Marland Kitchen aspirin 81 MG tablet Take 1 tablet (81 mg total) by mouth daily.    Marland Kitchen atorvastatin (LIPITOR) 80 MG tablet Take 1 tablet (80 mg total) by mouth daily. 90 tablet 3  . clopidogrel (PLAVIX) 75 MG tablet Take 1 tablet (75 mg total) by mouth daily. 90 tablet 2  . metoprolol succinate (TOPROL XL) 25 MG  24 hr tablet Take 1 tablet (25 mg total) by mouth daily. 30 tablet 2   Current Facility-Administered Medications  Medication Dose Route Frequency Provider Last Rate Last Admin  . sodium chloride flush (NS) 0.9 % injection 3 mL  3 mL Intravenous Q12H Runell Gess, MD        Review of Systems  Constitutional:  Constitutional negative. HENT: HENT negative.  Eyes: Eyes negative.  Cardiovascular: Positive for claudication.  GI: Gastrointestinal negative.  Musculoskeletal: Musculoskeletal negative.  Skin: Skin negative.  Neurological: Neurological negative. Hematologic: Hematologic/lymphatic negative.  Psychiatric: Psychiatric negative.        Objective:  Objective   Vitals:    10/26/20 1407  BP: (!) 165/81  Pulse: 85  Resp: 20  Temp: 98.3 F (36.8 C)  SpO2: 94%  Weight: 186 lb 11.2 oz (84.7 kg)  Height: 6' (1.829 m)   Body mass index is 25.32 kg/m.  Physical Exam HENT:     Head: Normocephalic.     Nose:     Comments: Wearing a mask Eyes:     Pupils: Pupils are equal, round, and reactive to light.  Cardiovascular:     Rate and Rhythm: Normal rate.     Pulses:          Femoral pulses are 0 on the right side and 1+ on the left side. Abdominal:     General: Abdomen is flat.     Palpations: Abdomen is soft.  Musculoskeletal:        General: Normal range of motion.     Cervical back: Normal range of motion.  Skin:    Capillary Refill: Capillary refill takes 2 to 3 seconds.  Neurological:     Mental Status: He is alert.  Psychiatric:        Thought Content: Thought content normal.        Judgment: Judgment normal.     Data: Angiogram was reviewed revealing right common iliac artery occlusion with disease of his right external iliac artery and common femoral arteries bilaterally flow to his bilateral lower extremities is normal after the common femoral arteries.     Assessment/Plan:     59 year old male with claudication with occluded right common and external iliac arteries heavily diseased bilateral common femoral arteries.  His only option for revascularization appears to be aortobifemoral bypass.  At this time the patient wants to continue to attempt smoking cessation currently smoking 10 cigarettes/day and continue to walk and avoid surgery.  He will follow-up in 6 months with ABIs unless he has issues before that.  If we were to plan surgery we would need CT of his abdomen and pelvis.     Maeola Harman MD [facility]

## 2020-11-01 ENCOUNTER — Other Ambulatory Visit: Payer: Self-pay | Admitting: *Deleted

## 2020-11-01 DIAGNOSIS — I739 Peripheral vascular disease, unspecified: Secondary | ICD-10-CM

## 2020-11-30 ENCOUNTER — Emergency Department (HOSPITAL_COMMUNITY)
Admission: EM | Admit: 2020-11-30 | Discharge: 2020-12-01 | Discharge status: Against Medical Advice | Payer: Self-pay | Attending: Emergency Medicine | Admitting: Emergency Medicine

## 2020-11-30 ENCOUNTER — Emergency Department (HOSPITAL_COMMUNITY): Payer: Self-pay

## 2020-11-30 DIAGNOSIS — E119 Type 2 diabetes mellitus without complications: Secondary | ICD-10-CM | POA: Insufficient documentation

## 2020-11-30 DIAGNOSIS — Z7982 Long term (current) use of aspirin: Secondary | ICD-10-CM | POA: Insufficient documentation

## 2020-11-30 DIAGNOSIS — S0083XA Contusion of other part of head, initial encounter: Secondary | ICD-10-CM

## 2020-11-30 DIAGNOSIS — F17200 Nicotine dependence, unspecified, uncomplicated: Secondary | ICD-10-CM | POA: Insufficient documentation

## 2020-11-30 DIAGNOSIS — I10 Essential (primary) hypertension: Secondary | ICD-10-CM | POA: Insufficient documentation

## 2020-11-30 DIAGNOSIS — F10129 Alcohol abuse with intoxication, unspecified: Secondary | ICD-10-CM | POA: Insufficient documentation

## 2020-11-30 DIAGNOSIS — S0181XA Laceration without foreign body of other part of head, initial encounter: Secondary | ICD-10-CM | POA: Insufficient documentation

## 2020-11-30 DIAGNOSIS — Z79899 Other long term (current) drug therapy: Secondary | ICD-10-CM | POA: Insufficient documentation

## 2020-11-30 DIAGNOSIS — I2510 Atherosclerotic heart disease of native coronary artery without angina pectoris: Secondary | ICD-10-CM | POA: Insufficient documentation

## 2020-11-30 DIAGNOSIS — F10920 Alcohol use, unspecified with intoxication, uncomplicated: Secondary | ICD-10-CM

## 2020-11-30 DIAGNOSIS — Z955 Presence of coronary angioplasty implant and graft: Secondary | ICD-10-CM | POA: Insufficient documentation

## 2020-11-30 MED ORDER — TETANUS-DIPHTH-ACELL PERTUSSIS 5-2.5-18.5 LF-MCG/0.5 IM SUSY: 0.5000 mL | PREFILLED_SYRINGE | Freq: Once | INTRAMUSCULAR | Status: DC

## 2020-11-30 NOTE — ED Triage Notes (Signed)
Pt arrives to ED BIB [facility] for a fall. Per EMS pt was dropped of at a [employer] [place] via taxi and the driver witnessed the pt fall once he got out of the taxi. ETOH on board. Pt has a laceration to the left side of his forehead. Pt had a c-collar on but was removed by the pt.

## 2020-11-30 NOTE — ED Provider Notes (Signed)
[facility] EMERGENCY DEPARTMENT Provider Note   CSN: [id number] Arrival date & time: 11/30/20  2240     History Chief Complaint  Patient presents with  . Fall    Karl Garcia is a 60 y.o. male.  Patient fell prior to arrival in the emergency department.  He had just been let off at a billiard hall by a taxi driver who witnessed the patient falling.  Patient arrives to the emergency department with laceration of the forehead.  He appears intoxicated.  Level 5 caveat due to intoxication.        Past Medical History:  Diagnosis Date  . Coronary artery disease    S/p NSTEMI 02/2020 >> s/p CABG // Echocardiogram 6/21: EF 55-60, Gr 2 DD, LAE  . Diabetes mellitus Type 2   . Essential hypertension   . ETOH abuse   . Hx of NSTEMI in 02/2020   . Hyperlipidemia   . L subclavian stenosis   . Peripheral Arterial Disease   . Tobacco abuse     Patient Active Problem List   Diagnosis Date Noted  . Encounter for post surgical wound check 05/16/2020  . Stenosis of left subclavian artery (HCC) 04/12/2020  . Peripheral arterial disease (HCC) 04/12/2020  . Tobacco abuse 04/12/2020  . Postop check 03/21/2020  . Alcohol abuse 03/02/2020  . 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 Chest pain 02/16/2020  . NSTEMI (non-ST elevated myocardial infarction) (HCC) 02/16/2020    Past Surgical History:  Procedure Laterality Date  . ABDOMINAL AORTOGRAM W/LOWER EXTREMITY Bilateral 07/16/2020   Procedure: ABDOMINAL AORTOGRAM W/LOWER EXTREMITY;  Surgeon: Runell Gess, MD;  Location: [facility] INVASIVE CV LAB;  Service: Cardiovascular;  Laterality: Bilateral;  . CORONARY ARTERY BYPASS GRAFT N/A 02/17/2020   Procedure: CORONARY ARTERY BYPASS GRAFTING (CABG) x Three, using left internal mammry artery and right leg greater saphenous vein harvested endoscopically;  Surgeon: Kerin Perna, MD;  Location: [facility] OR;  Service: Open Heart Surgery;  Laterality: N/A;  . LEFT HEART CATH AND CORONARY  ANGIOGRAPHY N/A 02/16/2020   Procedure: LEFT HEART CATH AND CORONARY ANGIOGRAPHY;  Surgeon: Runell Gess, MD;  Location: [facility] INVASIVE CV LAB;  Service: Cardiovascular;  Laterality: N/A;  . TEE WITHOUT CARDIOVERSION N/A 02/17/2020   Procedure: TRANSESOPHAGEAL ECHOCARDIOGRAM (TEE);  Surgeon: Donata Clay, Theron Arista, MD;  Location: [facility] OR;  Service: Open Heart Surgery;  Laterality: N/A;  . TONSILECTOMY, ADENOIDECTOMY, BILATERAL MYRINGOTOMY AND TUBES     unsure of all of that but + tonsilectomy       Family History  Problem Relation Age of Onset  . Heart attack Mother   . Heart attack Father   . Heart failure Father   . Healthy Brother     Social History   Tobacco Use  . Smoking status: Current Every Day Smoker    Packs/day: 0.50  . Smokeless tobacco: Never Used  Vaping Use  . Vaping Use: Never used  Substance Use Topics  . Alcohol use: Yes    Comment: 1 pint of liquor per day   . Drug use: Never    Home Medications Prior to Admission medications   Medication Sig Start Date End Date Taking? Authorizing Provider  acetaminophen (TYLENOL) 500 MG tablet Take 500 mg by mouth as needed for moderate pain or headache.    [provider]  aspirin 81 MG tablet Take 1 tablet (81 mg total) by mouth daily. 03/13/20   Tereso Newcomer T, PA-C  atorvastatin (LIPITOR)  80 MG tablet Take 1 tablet (80 mg total) by mouth daily. 05/21/20   Pricilla Riffle, MD  clopidogrel (PLAVIX) 75 MG tablet Take 1 tablet (75 mg total) by mouth daily. 03/13/20   Tereso Newcomer T, PA-C  metoprolol succinate (TOPROL XL) 25 MG 24 hr tablet Take 1 tablet (25 mg total) by mouth daily. 05/16/20   Kerin Perna, MD    Allergies    Morphine and related  Review of Systems   Review of Systems  Reason unable to perform ROS: intoxication.    Physical Exam Updated Vital Signs BP 118/70   Pulse 75   Resp 14   SpO2 93%   Physical Exam Vitals and nursing note reviewed.  Constitutional:      General: He is not in acute  distress.    Appearance: Normal appearance. He is well-developed.  HENT:     Head: Normocephalic. Abrasion and laceration (Superficial) present.      Right Ear: Hearing normal.     Left Ear: Hearing normal.     Nose: Nose normal.  Eyes:     Conjunctiva/sclera: Conjunctivae normal.     Pupils: Pupils are equal, round, and reactive to light.  Cardiovascular:     Rate and Rhythm: Regular rhythm.     Heart sounds: S1 normal and S2 normal. No murmur heard. No friction rub. No gallop.   Pulmonary:     Effort: Pulmonary effort is normal. No respiratory distress.     Breath sounds: Normal breath sounds.  Chest:     Chest wall: No tenderness.  Abdominal:     General: Bowel sounds are normal.     Palpations: Abdomen is soft.     Tenderness: There is no abdominal tenderness. There is no guarding or rebound. Negative signs include Murphy's sign and McBurney's sign.     Hernia: No hernia is present.  Musculoskeletal:        General: Normal range of motion.     Cervical back: Normal range of motion and neck supple.  Skin:    General: Skin is warm and dry.     Findings: No rash.  Neurological:     Mental Status: He is alert and oriented to person, place, and time.     GCS: GCS eye subscore is 4. GCS verbal subscore is 5. GCS motor subscore is 6.     Cranial Nerves: No cranial nerve deficit.     Sensory: No sensory deficit.     Coordination: Coordination normal.  Psychiatric:        Speech: Speech is slurred and tangential.     ED Results / Procedures / Treatments   Labs (all labs ordered are listed, but only abnormal results are displayed) Labs Reviewed  CBC - Abnormal; Notable for the following components:      Result Value   RBC 4.16 (*)    MCH 34.9 (*)    All other components within normal limits  COMPREHENSIVE METABOLIC PANEL - Abnormal; Notable for the following components:   Glucose, Bld 113 (*)    Calcium 8.8 (*)    All other components within normal limits  ETHANOL -  Abnormal; Notable for the following components:   Alcohol, Ethyl (B) 391 (*)    All other components within normal limits    EKG EKG Interpretation  Date/Time:  Friday November 30 2020 22:47:43 EDT Ventricular Rate:  68 PR Interval:    QRS Duration: 102 QT Interval:  390 QTC Calculation: 415  R Axis:   15 Text Interpretation: Sinus rhythm Atrial premature complex Probable left atrial enlargement Abnrm T, consider ischemia, anterolateral lds Borderline ST elevation, lateral leads Confirmed by Gilda Crease [phone]) on 11/30/2020 10:59:00 PM   Radiology CT HEAD WO CONTRAST  Result Date: 11/30/2020 CLINICAL DATA:  Larey Seat, intoxicated, left-sided scalp laceration EXAM: CT HEAD WITHOUT CONTRAST TECHNIQUE: Contiguous axial images were obtained from the base of the skull through the vertex without intravenous contrast. COMPARISON:  09/23/2013 FINDINGS: Brain: No acute infarct or hemorrhage. Lateral ventricles and midline structures are unremarkable. No acute extra-axial fluid collections. No mass effect. Vascular: No hyperdense vessel or unexpected calcification. Skull: Normal. Negative for fracture or focal lesion. Sinuses/Orbits: Mild mucosal thickening within the ethmoid air cells and right maxillary sinus. Other: None. IMPRESSION: 1. No acute intracranial process. Electronically Signed   By: Sharlet Salina M.D.   On: 11/30/2020 23:57   CT CERVICAL SPINE WO CONTRAST  Result Date: 11/30/2020 CLINICAL DATA:  Intoxicated, fell EXAM: CT CERVICAL SPINE WITHOUT CONTRAST TECHNIQUE: Multidetector CT imaging of the cervical spine was performed without intravenous contrast. Multiplanar CT image reconstructions were also generated. COMPARISON:  09/23/2013 FINDINGS: Alignment: Alignment is grossly anatomic. Skull base and vertebrae: No acute fracture. No primary bone lesion or focal pathologic process. Soft tissues and spinal canal: No prevertebral fluid or swelling. No visible canal hematoma. Disc levels:  There is diffuse multilevel cervical spondylosis and facet hypertrophy. Spondylosis most pronounced at C4-5, C5-6, and C6-7, with symmetrical neural foraminal encroachment as result. Upper chest: Airway is patent. Emphysematous changes are seen at the lung apices. Other: Reconstructed images demonstrate no additional findings. IMPRESSION: 1. Extensive multilevel cervical spondylosis and facet hypertrophy. No acute cervical spine fracture. Electronically Signed   By: Sharlet Salina M.D.   On: 11/30/2020 23:59    Procedures Procedures   Medications Ordered in ED Medications  Tdap (BOOSTRIX) injection 0.5 mL (0.5 mLs Intramuscular Not Given 12/01/20 0039)    ED Course  I have reviewed the triage vital signs and the nursing notes.  Pertinent labs & imaging results that were available during my care of the patient were reviewed by me and considered in my medical decision making (see chart for details).    MDM Rules/Calculators/A&P                          Patient brought to the emergency department after falling while getting out of a taxicab.  Patient intoxicated at arrival.  Patient with blood adherent to the right side of his head.  When cleaned it appears that these were superficial abrasions and lacerations.  CT did not show any acute abnormality.  This included head and cervical spine.  Lab work revealed elevated alcohol level, no other acute abnormality.  Patient monitored through the night.  He has done well.  At some point this morning, however, he eloped from the department.  Final Clinical Impression(s) / ED Diagnoses Final diagnoses:  Contusion of face, initial encounter  Alcoholic intoxication without complication [facility])    Rx / DC Orders ED Discharge Orders    None       Davi Kroon, Canary Brim, MD 12/01/20 [phone]

## 2020-11-30 NOTE — ED Notes (Signed)
Pt to CT

## 2020-12-01 LAB — CBC
HCT: 41.3 % (ref 39.0–52.0)
Hemoglobin: 14.5 g/dL (ref 13.0–17.0)
MCH: 34.9 pg — ABNORMAL HIGH (ref 26.0–34.0)
MCHC: 35.1 g/dL (ref 30.0–36.0)
MCV: 99.3 fL (ref 80.0–100.0)
Platelets: 180 10*3/uL (ref 150–400)
RBC: 4.16 MIL/uL — ABNORMAL LOW (ref 4.22–5.81)
RDW: 12.6 % (ref 11.5–15.5)
WBC: 6.5 10*3/uL (ref 4.0–10.5)
nRBC: 0 % (ref 0.0–0.2)

## 2020-12-01 LAB — COMPREHENSIVE METABOLIC PANEL
ALT: 25 U/L (ref 0–44)
AST: 28 U/L (ref 15–41)
Albumin: 3.9 g/dL (ref 3.5–5.0)
Alkaline Phosphatase: 52 U/L (ref 38–126)
Anion gap: 11 (ref 5–15)
BUN: 11 mg/dL (ref 8–23)
CO2: 24 mmol/L (ref 22–32)
Calcium: 8.8 mg/dL — ABNORMAL LOW (ref 8.9–10.3)
Chloride: 110 mmol/L (ref 98–111)
Creatinine, Ser: 0.72 mg/dL (ref 0.61–1.24)
GFR, Estimated: >60 mL/min (ref >60)
Glucose, Bld: 113 mg/dL — ABNORMAL HIGH (ref 70–99)
Potassium: 3.8 mmol/L (ref 3.5–5.1)
Sodium: 145 mmol/L (ref 135–145)
Total Bilirubin: 0.4 mg/dL (ref 0.3–1.2)
Total Protein: 6.7 g/dL (ref 6.5–8.1)

## 2020-12-01 LAB — ETHANOL: Alcohol, Ethyl (B): 391 mg/dL (ref <10)

## 2020-12-01 NOTE — ED Notes (Addendum)
Pt resting comfortably in bed at this time with eyes closed, respirations even and unlabored, no acute distress noted at this time.

## 2020-12-01 NOTE — ED Notes (Signed)
Pt refused to allow this RN to treat pt at this time, pt waving arms and coming combative.

## 2020-12-01 NOTE — ED Notes (Addendum)
NT attempted to hook pt back up to monitor. Pt being very combative and wouldn't let NT touch pt. RN notified.

## 2020-12-01 NOTE — ED Notes (Signed)
Post wound to L forehead being cleansed, pt dressed self and walked out of treatment area, this RN attempted to stop pt so MD can reassess. Pt refused, pt gait steady as pt ambulated self out [facility] ER. Blinda Leatherwood, MD made aware.

## 2021-01-06 ENCOUNTER — Encounter (HOSPITAL_COMMUNITY): Payer: Self-pay | Admitting: Emergency Medicine

## 2021-01-06 ENCOUNTER — Other Ambulatory Visit: Payer: Self-pay

## 2021-01-06 ENCOUNTER — Emergency Department (HOSPITAL_COMMUNITY)
Admission: EM | Admit: 2021-01-06 | Discharge: 2021-01-06 | Disposition: A | Discharge status: Home / Self Care | Payer: Self-pay | Attending: Emergency Medicine | Admitting: Emergency Medicine

## 2021-01-06 DIAGNOSIS — I10 Essential (primary) hypertension: Secondary | ICD-10-CM | POA: Insufficient documentation

## 2021-01-06 DIAGNOSIS — Z951 Presence of aortocoronary bypass graft: Secondary | ICD-10-CM | POA: Insufficient documentation

## 2021-01-06 DIAGNOSIS — F10129 Alcohol abuse with intoxication, unspecified: Secondary | ICD-10-CM | POA: Insufficient documentation

## 2021-01-06 DIAGNOSIS — F17210 Nicotine dependence, cigarettes, uncomplicated: Secondary | ICD-10-CM | POA: Insufficient documentation

## 2021-01-06 DIAGNOSIS — Z7982 Long term (current) use of aspirin: Secondary | ICD-10-CM | POA: Insufficient documentation

## 2021-01-06 DIAGNOSIS — I2510 Atherosclerotic heart disease of native coronary artery without angina pectoris: Secondary | ICD-10-CM | POA: Insufficient documentation

## 2021-01-06 DIAGNOSIS — Z79899 Other long term (current) drug therapy: Secondary | ICD-10-CM | POA: Insufficient documentation

## 2021-01-06 DIAGNOSIS — F10920 Alcohol use, unspecified with intoxication, uncomplicated: Secondary | ICD-10-CM

## 2021-01-06 LAB — RAPID URINE DRUG SCREEN, HOSP PERFORMED
Amphetamines: NOT DETECTED
Barbiturates: NOT DETECTED
Benzodiazepines: NOT DETECTED
Cocaine: NOT DETECTED
Opiates: NOT DETECTED
Tetrahydrocannabinol: NOT DETECTED

## 2021-01-06 LAB — CBG MONITORING, ED: Glucose-Capillary: 108 mg/dL — ABNORMAL HIGH (ref 70–99)

## 2021-01-06 NOTE — ED Triage Notes (Signed)
Patient brought in upstairs (visiting mother who is admitted), was brought downstairs from staff for stumbling and attempting to walk into other patient rooms. Patient admits to drinking one pint of vodka "as fast as he could" this morning. Patient is otherwise a poor historian.

## 2021-01-06 NOTE — ED Notes (Signed)
Pt ambulated out of facility, gait steady. Requested information for taxi. Pleasant and thankful, advised him that right now he should go home and rest. He wanted to check on his mother and I let him know I was not sure what all transpired upstairs but to call and check on your mother later today.

## 2021-01-06 NOTE — ED Provider Notes (Signed)
[facility]-EMERGENCY DEPT Provider Note   CSN: [id number] Arrival date & time: 01/06/21  5631     History Chief Complaint  Patient presents with  . Alcohol Intoxication    Karl Garcia is a 60 y.o. male.  60 year old male who was found upstairs acutely intoxicated.  Patient mitts to drinking vodka yesterday.  Patient denies any other substance use at this time.  Has no complaints.        Past Medical History:  Diagnosis Date  . Coronary artery disease    S/p NSTEMI 02/2020 >> s/p CABG // Echocardiogram 6/21: EF 55-60, Gr 2 DD, LAE  . Diabetes mellitus Type 2   . Essential hypertension   . ETOH abuse   . Hx of NSTEMI in 02/2020   . Hyperlipidemia   . L subclavian stenosis   . Peripheral Arterial Disease   . Tobacco abuse     Patient Active Problem List   Diagnosis Date Noted  . Encounter for post surgical wound check 05/16/2020  . Stenosis of left subclavian artery (HCC) 04/12/2020  . Peripheral arterial disease (HCC) 04/12/2020  . Tobacco abuse 04/12/2020  . Postop check 03/21/2020  . Alcohol abuse 03/02/2020  . 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 Chest pain 02/16/2020  . NSTEMI (non-ST elevated myocardial infarction) (HCC) 02/16/2020    Past Surgical History:  Procedure Laterality Date  . ABDOMINAL AORTOGRAM W/LOWER EXTREMITY Bilateral 07/16/2020   Procedure: ABDOMINAL AORTOGRAM W/LOWER EXTREMITY;  Surgeon: Runell Gess, MD;  Location: [facility] INVASIVE CV LAB;  Service: Cardiovascular;  Laterality: Bilateral;  . CORONARY ARTERY BYPASS GRAFT N/A 02/17/2020   Procedure: CORONARY ARTERY BYPASS GRAFTING (CABG) x Three, using left internal mammry artery and right leg greater saphenous vein harvested endoscopically;  Surgeon: Kerin Perna, MD;  Location: [facility] OR;  Service: Open Heart Surgery;  Laterality: N/A;  . LEFT HEART CATH AND CORONARY ANGIOGRAPHY N/A 02/16/2020   Procedure: LEFT HEART CATH AND CORONARY ANGIOGRAPHY;  Surgeon: Runell Gess,  MD;  Location: [facility] INVASIVE CV LAB;  Service: Cardiovascular;  Laterality: N/A;  . TEE WITHOUT CARDIOVERSION N/A 02/17/2020   Procedure: TRANSESOPHAGEAL ECHOCARDIOGRAM (TEE);  Surgeon: Donata Clay, Theron Arista, MD;  Location: [facility] OR;  Service: Open Heart Surgery;  Laterality: N/A;  . TONSILECTOMY, ADENOIDECTOMY, BILATERAL MYRINGOTOMY AND TUBES     unsure of all of that but + tonsilectomy       Family History  Problem Relation Age of Onset  . Heart attack Mother   . Heart attack Father   . Heart failure Father   . Healthy Brother     Social History   Tobacco Use  . Smoking status: Current Every Day Smoker    Packs/day: 0.50  . Smokeless tobacco: Never Used  Vaping Use  . Vaping Use: Never used  Substance Use Topics  . Alcohol use: Yes    Comment: 1 pint of liquor per day   . Drug use: Never    Home Medications Prior to Admission medications   Medication Sig Start Date End Date Taking? Authorizing Provider  acetaminophen (TYLENOL) 500 MG tablet Take 500 mg by mouth as needed for moderate pain or headache.    [provider]  aspirin 81 MG tablet Take 1 tablet (81 mg total) by mouth daily. 03/13/20   Tereso Newcomer T, PA-C  atorvastatin (LIPITOR) 80 MG tablet Take 1 tablet (80 mg total) by mouth daily. 05/21/20   Pricilla Riffle, MD  clopidogrel (PLAVIX) 75  MG tablet Take 1 tablet (75 mg total) by mouth daily. 03/13/20   Tereso Newcomer T, PA-C  metoprolol succinate (TOPROL XL) 25 MG 24 hr tablet Take 1 tablet (25 mg total) by mouth daily. 05/16/20   Kerin Perna, MD    Allergies    Morphine and related  Review of Systems   Review of Systems  All other systems reviewed and are negative.   Physical Exam Updated Vital Signs There were no vitals taken for this visit.  Physical Exam Vitals and nursing note reviewed.  Constitutional:      General: He is not in acute distress.    Appearance: Normal appearance. He is well-developed. He is not toxic-appearing.  HENT:     Head:  Normocephalic and atraumatic.  Eyes:     General: Lids are normal.     Conjunctiva/sclera: Conjunctivae normal.     Pupils: Pupils are equal, round, and reactive to light.  Neck:     Thyroid: No thyroid mass.     Trachea: No tracheal deviation.  Cardiovascular:     Rate and Rhythm: Normal rate and regular rhythm.     Heart sounds: Normal heart sounds. No murmur heard. No gallop.   Pulmonary:     Effort: Pulmonary effort is normal. No respiratory distress.     Breath sounds: Normal breath sounds. No stridor. No decreased breath sounds, wheezing, rhonchi or rales.  Abdominal:     General: Bowel sounds are normal. There is no distension.     Palpations: Abdomen is soft.     Tenderness: There is no abdominal tenderness. There is no rebound.  Musculoskeletal:        General: No tenderness. Normal range of motion.     Cervical back: Normal range of motion and neck supple.  Skin:    General: Skin is warm and dry.     Findings: No abrasion or rash.  Neurological:     Mental Status: He is alert and oriented to person, place, and time.     GCS: GCS eye subscore is 4. GCS verbal subscore is 5. GCS motor subscore is 6.     Cranial Nerves: No cranial nerve deficit.     Sensory: No sensory deficit.  Psychiatric:        Attention and Perception: Attention normal.        Mood and Affect: Affect is flat.        Speech: Speech is slurred.        Behavior: Behavior is slowed.     ED Results / Procedures / Treatments   Labs (all labs ordered are listed, but only abnormal results are displayed) Labs Reviewed  COMPREHENSIVE METABOLIC PANEL  ETHANOL  CBC  RAPID URINE DRUG SCREEN, HOSP PERFORMED    EKG None  Radiology No results found.  Procedures Procedures   Medications Ordered in ED Medications - No data to display  ED Course  I have reviewed the triage vital signs and the nursing notes.  Pertinent labs & imaging results that were available during my care of the patient were  reviewed by me and considered in my medical decision making (see chart for details).    MDM Rules/Calculators/A&P                          Patient monitored here and now is clinically sober for discharge Final Clinical Impression(s) / ED Diagnoses Final diagnoses:  None    Rx /  DC Orders ED Discharge Orders    None       Lorre Nick, MD 01/06/21 1126

## 2021-01-06 NOTE — ED Notes (Signed)
Pt alert states he is drunk and just needs hour or two to sleep it off. Does not want fluids or labs drawn. Admits to not driving and does not have any family or friends that can drive him.

## 2022-12-03 ENCOUNTER — Ambulatory Visit (INDEPENDENT_AMBULATORY_CARE_PROVIDER_SITE_OTHER): Payer: 59 | Admitting: Nurse Practitioner

## 2022-12-03 ENCOUNTER — Encounter: Payer: Self-pay | Admitting: Nurse Practitioner

## 2022-12-03 VITALS — BP 118/88 | HR 94 | Temp 98.9°F | Resp 16 | Ht 72.0 in | Wt 183.2 lb

## 2022-12-03 DIAGNOSIS — I739 Peripheral vascular disease, unspecified: Secondary | ICD-10-CM

## 2022-12-03 DIAGNOSIS — Z720 Tobacco use: Secondary | ICD-10-CM | POA: Diagnosis not present

## 2022-12-03 DIAGNOSIS — G629 Polyneuropathy, unspecified: Secondary | ICD-10-CM | POA: Diagnosis not present

## 2022-12-03 DIAGNOSIS — Z5900 Homelessness unspecified: Secondary | ICD-10-CM

## 2022-12-03 DIAGNOSIS — R739 Hyperglycemia, unspecified: Secondary | ICD-10-CM

## 2022-12-03 DIAGNOSIS — H6122 Impacted cerumen, left ear: Secondary | ICD-10-CM

## 2022-12-03 DIAGNOSIS — F1010 Alcohol abuse, uncomplicated: Secondary | ICD-10-CM | POA: Diagnosis not present

## 2022-12-03 DIAGNOSIS — I25810 Atherosclerosis of coronary artery bypass graft(s) without angina pectoris: Secondary | ICD-10-CM | POA: Diagnosis not present

## 2022-12-03 MED ORDER — ASPIRIN 81 MG PO TBEC: 81.0000 mg | DELAYED_RELEASE_TABLET | Freq: Every day | ORAL | 12 refills | Status: DC

## 2022-12-03 NOTE — Progress Notes (Signed)
Careteam: Patient Care Team: Lauree Chandler, NP as PCP - General (Geriatric Medicine) Fay Records, MD as PCP - Cardiology (Cardiology)  PLACE OF SERVICE:  [place] Directive information Does Patient Have a Medical Advance Directive?: No, Would patient like information on creating a medical advance directive?: No - Patient declined  Allergies  Allergen Reactions   Morphine And Related Rash    Chief Complaint  Patient presents with   [place] Patient.     HPI: Patient is a 62 y.o. male who is here today to establish care. He has not had any routine medical care recently.  He went to the [facility]  He has Medicaid so [facility] could not do anything for him, so he is here to establish care. The most important issue for him now is the pain in his legs while walking.   Homeless. Staying at a friend's house; he can stay there as long as he needs and feels safe there. Been homeless about a month. He is eating meals and getting food. Transportation by the bus.   Jobless. Looking for a job. Worked about 6 months ago, working as a Water engineer.  Medications: Not taking any medications, vitamins or supplements Family hx: Father died of a brain aneurysm around age 58                  Heart disease in father and his brothers                  Mother is still alive and no medical conditions                  Brother still alive and no medical conditions Denies diabetes, cancer, heart disease, stroke, high blood pressure in family Medical hx: Bypass surgery about 3 years ago at [facility], Has not seen a cardiologist since then - [employer]  Current smoker: half a pack- 1 pack day for about 50+ years Discussed risks of smoking, effect on current conditions, the patient is interested in quitting but does not want to start at this time. Reinforced available support from primary care when patient feels he is ready  Current alcohol abuse: 5 drinks a week. Vodka or bourbon:  standard drink is one shot  Heavy drinker in the past, about a pint a day since he 'came of age'.  He says he has cut back on his own. He does not need support for this at this time.  He is not seeing any other physicians at this time.  Never seen a primary care provider.   No chest pain or shortness of breath.   "Blockage in his legs and he gets cramps" If he tries to walk too far his legs his calves and hips cramp up been going on for years. Better with rest. L hand/arm is numb since the bypass surgery. Right toes are numb. No open wounds or skin color changes per patient  Never had a colonoscopy, would be interested in CT lung cancer screening. Denies blood in bowel or urine. Voids about 3 times at night but this is nothing new for him. Denies dizziness or headaches.   Review of Systems:  Review of Systems  Constitutional:  Negative for chills, fever, malaise/fatigue and weight loss.  HENT:  Negative for congestion and sore throat.   Eyes:  Negative for blurred vision.  Respiratory:  Negative for cough, shortness of breath and wheezing.  Cardiovascular:  Positive for claudication. Negative for chest pain, palpitations and leg swelling.  Gastrointestinal:  Negative for abdominal pain, blood in stool, constipation, diarrhea, heartburn, nausea and vomiting.  Genitourinary:  Negative for dysuria, frequency, hematuria and urgency.  Musculoskeletal:  Negative for falls and joint pain.  Skin:  Negative for rash.  Neurological:  Negative for dizziness, tingling and headaches.       Neuropathy R toes and L hand  Endo/Heme/Allergies:  Negative for polydipsia.  Psychiatric/Behavioral:  Negative for depression. The patient is not nervous/anxious.     Past Medical History:  Diagnosis Date   Coronary artery disease    S/p NSTEMI 02/2020 >> s/p CABG // Echocardiogram 6/21: EF 55-60, Gr 2 DD, LAE   Diabetes mellitus Type 2    Essential hypertension    ETOH abuse    Hx of NSTEMI in 02/2020     Hyperlipidemia    L subclavian stenosis    Peripheral Arterial Disease    Tobacco abuse    Past Surgical History:  Procedure Laterality Date   ABDOMINAL AORTOGRAM W/LOWER EXTREMITY Bilateral 07/16/2020   Procedure: ABDOMINAL AORTOGRAM W/LOWER EXTREMITY;  Surgeon: Lorretta Harp, MD;  Location: [facility];  Service: Cardiovascular;  Laterality: Bilateral;   CORONARY ARTERY BYPASS GRAFT N/A 02/17/2020   Procedure: CORONARY ARTERY BYPASS GRAFTING (CABG) x Three, using left internal mammry artery and right leg greater saphenous vein harvested endoscopically;  Surgeon: Ivin Poot, MD;  Location: [facility];  Service: Open Heart Surgery;  Laterality: N/A;   LEFT HEART CATH AND CORONARY ANGIOGRAPHY N/A 02/16/2020   Procedure: LEFT HEART CATH AND CORONARY ANGIOGRAPHY;  Surgeon: Lorretta Harp, MD;  Location: [facility];  Service: Cardiovascular;  Laterality: N/A;   TEE WITHOUT CARDIOVERSION N/A 02/17/2020   Procedure: TRANSESOPHAGEAL ECHOCARDIOGRAM (TEE);  Surgeon: Prescott Gum, Collier Salina, MD;  Location: [facility];  Service: Open Heart Surgery;  Laterality: N/A;   TONSILECTOMY, ADENOIDECTOMY, BILATERAL MYRINGOTOMY AND TUBES     unsure of all of that but + tonsilectomy   Social History:   reports that he has been smoking cigarettes. He has a 50.00 pack-year smoking history. He has never used smokeless tobacco. He reports current alcohol use of about 5.0 standard drinks of alcohol per week. He reports that he does not use drugs.  Family History  Problem Relation Age of Onset   Heart attack Mother    Heart disease Father    Heart attack Father    Heart failure Father    Aneurysm Father    Healthy Brother    Heart disease Paternal Uncle     Medications: Patient's Medications  New Prescriptions   No medications on file  Previous Medications   No medications on file  Modified Medications   No medications on file  Discontinued Medications   ACETAMINOPHEN (TYLENOL) 500 MG TABLET     Take 500 mg by mouth as needed for moderate pain or headache.   ASPIRIN 81 MG TABLET    Take 1 tablet (81 mg total) by mouth daily.   ATORVASTATIN (LIPITOR) 80 MG TABLET    Take 1 tablet (80 mg total) by mouth daily.   CLOPIDOGREL (PLAVIX) 75 MG TABLET    Take 1 tablet (75 mg total) by mouth daily.   METOPROLOL SUCCINATE (TOPROL XL) 25 MG 24 HR TABLET    Take 1 tablet (25 mg total) by mouth daily.    Physical Exam:  Vitals:   12/03/22 0949  BP:  118/88  Pulse: 94  Resp: 16  Temp: 98.9 F (37.2 C)  SpO2: 93%  Weight: 183 lb 3.2 oz (83.1 kg)  Height: 6' (1.829 m)   Body mass index is 24.85 kg/m. Wt Readings from Last 3 Encounters:  12/03/22 183 lb 3.2 oz (83.1 kg)  10/26/20 186 lb 11.2 oz (84.7 kg)  08/15/20 186 lb (84.4 kg)    Physical Exam Vitals reviewed.  Constitutional:      Comments: Appears older than stated age Strong odor of urine, and possibly alcohol  HENT:     Right Ear: Tympanic membrane, ear canal and external ear normal.     Left Ear: External ear normal. There is impacted cerumen.  Cardiovascular:     Rate and Rhythm: Normal rate and regular rhythm.     Heart sounds: Normal heart sounds. No murmur heard. Pulmonary:     Breath sounds: Normal breath sounds. No wheezing or rales.  Abdominal:     General: Bowel sounds are normal.     Palpations: Abdomen is soft. There is no mass.     Tenderness: There is no abdominal tenderness. There is no guarding.  Musculoskeletal:        General: No swelling or tenderness.  Skin:    General: Skin is warm and dry.  Neurological:     Mental Status: He is alert and oriented to person, place, and time. Mental status is at baseline.  Psychiatric:        Mood and Affect: Mood normal.        Behavior: Behavior normal.        Thought Content: Thought content normal.        Judgment: Judgment normal.     Labs reviewed: Basic Metabolic Panel: No results for input(s): "NA", "K", "CL", "CO2", "GLUCOSE", "BUN",  "CREATININE", "CALCIUM", "MG", "PHOS", "TSH" in the last 8760 hours. Liver Function Tests: No results for input(s): "AST", "ALT", "ALKPHOS", "BILITOT", "PROT", "ALBUMIN" in the last 8760 hours. No results for input(s): "LIPASE", "AMYLASE" in the last 8760 hours. No results for input(s): "AMMONIA" in the last 8760 hours. CBC: No results for input(s): "WBC", "NEUTROABS", "HGB", "HCT", "MCV", "PLT" in the last 8760 hours. Lipid Panel: No results for input(s): "CHOL", "HDL", "LDLCALC", "TRIG", "CHOLHDL", "LDLDIRECT" in the last 8760 hours. TSH: No results for input(s): "TSH" in the last 8760 hours. A1C: Lab Results  Component Value Date   HGBA1C 6.8 (H) 02/16/2020     Assessment/Plan 1. Alcohol abuse Pt reports he has cut back on the amount he drinks on his own but has a long history of heavy drinking. Monitor closely. Advised to quit. Offered support as needed.  - CBC With Differential/Platelet - Complete Metabolic Panel with eGFR -likely will need b12 and folate supplement. Will await lab results.   2. Tobacco abuse Pt has a long history of smoking. Discussed cessation today. He is interested but not ready to quit at this time. Offered support when patient is ready to quit.  -CT Lung Screening  3. Peripheral arterial disease ([place]) Previously seeing Dr. Donzetta Matters. Main complaint today is leg pain with ambulation that resolves with rest today.  -encouraged to take ASA 81 mg daily  - Ambulatory referral to Vascular Surgery - Ambulatory referral to Cardiology  4. Homelessness He has a safe place to stay at the moment where he is receiving meals. He is looking for a job. - HIV Antibody (routine testing w rflx) - Hep C Antibody - AMB Referral to [employer]  Care Coordinaton ([facility] Patients)  5. Neuropathy Pt has multiple risk factors including diabetes, hyperlipidemia, tobacco and alcohol abuse.  - Hemoglobin A1c  6. Coronary artery disease involving coronary bypass graft of native heart  without angina pectoris Hx of CABG x3. Was seeing Dr. Gwenlyn Found then lost to f/u.  - Lipid Panel - Ambulatory referral to Vascular Surgery - Ambulatory referral to Cardiology  7. Hyperglycemia Last A1C in 2021 6.8.  - Hemoglobin A1c  8. Cerumen impaction, left Lavage completed and pt tolerated well.   Will follow up in 2 weeks for AWV Return in about 3 months (around 03/05/2023).  Student- Archer Asa O'Berry ACPCNP-S  I personally was present during the history, physical exam and medical decision-making activities of this service and have verified that the service and findings are accurately documented in the student's note Pascuala Klutts K. [place], [facility] [phone]

## 2022-12-03 NOTE — Patient Instructions (Signed)
To schedule in person AWV in 2 weeks- or next available.

## 2022-12-04 ENCOUNTER — Telehealth: Payer: Self-pay | Admitting: *Deleted

## 2022-12-04 ENCOUNTER — Ambulatory Visit: Payer: Self-pay | Admitting: Licensed Clinical Social Worker

## 2022-12-04 LAB — COMPLETE METABOLIC PANEL WITH GFR
AG Ratio: 1.4 (calc) (ref 1.0–2.5)
ALT: 15 U/L (ref 9–46)
AST: 21 U/L (ref 10–35)
Albumin: 3.9 g/dL (ref 3.6–5.1)
Alkaline phosphatase (APISO): 64 U/L (ref 35–144)
BUN: 7 mg/dL (ref 7–25)
CO2: 30 mmol/L (ref 20–32)
Calcium: 8.8 mg/dL (ref 8.6–10.3)
Chloride: 104 mmol/L (ref 98–110)
Creat: 0.82 mg/dL (ref 0.70–1.35)
Globulin: 2.7 g/dL (calc) (ref 1.9–3.7)
Glucose, Bld: 105 mg/dL — ABNORMAL HIGH (ref 65–99)
Potassium: 4 mmol/L (ref 3.5–5.3)
Sodium: 140 mmol/L (ref 135–146)
Total Bilirubin: 0.5 mg/dL (ref 0.2–1.2)
Total Protein: 6.6 g/dL (ref 6.1–8.1)
eGFR: 99 mL/min/{1.73_m2} (ref >=60)

## 2022-12-04 LAB — LIPID PANEL
Cholesterol: 183 mg/dL (ref <200)
HDL: 51 mg/dL (ref >=40)
LDL Cholesterol (Calc): 109 mg/dL (calc) — ABNORMAL HIGH
Non-HDL Cholesterol (Calc): 132 mg/dL (calc) — ABNORMAL HIGH (ref <130)
Total CHOL/HDL Ratio: 3.6 (calc) (ref <5.0)
Triglycerides: 118 mg/dL (ref <150)

## 2022-12-04 LAB — HEMOGLOBIN A1C
Hgb A1c MFr Bld: 6.4 % of total Hgb — ABNORMAL HIGH (ref <5.7)
Mean Plasma Glucose: 137 mg/dL
eAG (mmol/L): 7.6 mmol/L

## 2022-12-04 LAB — CBC WITH DIFFERENTIAL/PLATELET
Absolute Monocytes: 691 cells/uL (ref 200–950)
Basophils Absolute: 32 cells/uL (ref 0–200)
Basophils Relative: 0.6 %
Eosinophils Absolute: 81 cells/uL (ref 15–500)
Eosinophils Relative: 1.5 %
HCT: 41.7 % (ref 38.5–50.0)
Hemoglobin: 14.5 g/dL (ref 13.2–17.1)
Lymphs Abs: 967 cells/uL (ref 850–3900)
MCH: 34.6 pg — ABNORMAL HIGH (ref 27.0–33.0)
MCHC: 34.8 g/dL (ref 32.0–36.0)
MCV: 99.5 fL (ref 80.0–100.0)
MPV: 10.6 fL (ref 7.5–12.5)
Monocytes Relative: 12.8 %
Neutro Abs: 3629 cells/uL (ref 1500–7800)
Neutrophils Relative %: 67.2 %
Platelets: 139 10*3/uL — ABNORMAL LOW (ref 140–400)
RBC: 4.19 10*6/uL — ABNORMAL LOW (ref 4.20–5.80)
RDW: 14.3 % (ref 11.0–15.0)
Total Lymphocyte: 17.9 %
WBC: 5.4 10*3/uL (ref 3.8–10.8)

## 2022-12-04 LAB — HIV ANTIBODY (ROUTINE TESTING W REFLEX): HIV 1&2 Ab, 4th Generation: NONREACTIVE

## 2022-12-04 LAB — HEPATITIS C ANTIBODY: Hepatitis C Ab: NONREACTIVE

## 2022-12-04 NOTE — Patient Instructions (Signed)
Visit Information  Thank you for taking time to visit with me today. Please don't hesitate to contact me if I can be of assistance to you.   Following are the goals we discussed today:   Goals Addressed             This Visit's Progress    Obtain Supportive Resources-Housing   On track    Activities and task to complete in order to accomplish goals.   Keep all upcoming appointments discussed today Continue with compliance of taking medication prescribed by Doctor Implement healthy coping skills discussed to assist with management of symptoms Continue working with [facility] care team to assist with goals identified Follow up with housing resources discussed          Our next appointment is by telephone on 4/1 at 3 PM  Please call the care guide team at [phone] if you need to cancel or reschedule your appointment.   If you are experiencing a Mental Health or [place] or need someone to talk to, please call the Suicide and Crisis Lifeline: 988 call 911   Patient verbalizes understanding of instructions and care plan provided today and agrees to view in [place]. Active MyChart status and patient understanding of how to access instructions and care plan via MyChart confirmed with patient.     Christa See, MSW, [email or web address] Phone [phone] 3:32 PM

## 2022-12-04 NOTE — Patient Outreach (Signed)
  Care Coordination   Initial Visit Note   12/04/2022 Name: Karl Garcia MRN: [id number] DOB: 1960/10/25  Karl Garcia is a 62 y.o. year old male who sees Eubanks, Carlos American, NP for primary care. I spoke with  Karl Garcia by phone today.  What matters to the patients health and wellness today?  Housing    Goals Addressed             This Visit's Progress    Obtain Supportive Resources-Housing   On track    Activities and task to complete in order to accomplish goals.   Keep all upcoming appointments discussed today Continue with compliance of taking medication prescribed by Doctor Implement healthy coping skills discussed to assist with management of symptoms Continue working with [facility] care team to assist with goals identified Follow up with housing resources discussed          SDOH assessments and interventions completed:  Yes  SDOH Interventions Today    Flowsheet Row Most Recent Value  SDOH Interventions   Food Insecurity Interventions Other (Comment)  [Patient was provided a listing of community meals provided by [facility]. Has means to store food at current [place] Interventions Other (Comment)  [LCSW discussed various resources for emergency and permanent housing. LCSW provided contact info for [place]  Transportation Interventions Intervention Not Indicated  [Patient utilizes city transportation]        Care Coordination Interventions:  Yes, provided  Interventions Today    Flowsheet Row Most Recent Value  Chronic Disease   Chronic disease during today's visit Other  [NSTEMI,  Alcohol use]  General Interventions   General Interventions Discussed/Reviewed General Interventions Discussed, Community Resources  [LCSW introduced self and explained role in care coordination program. Patient is open to services. LCSW reviewed upcoming appts, no current barriers to making appts]  Mental Health Interventions   Mental Health  Discussed/Reviewed Mental Health Discussed  [Pt has a limited support system. He is utilizing current resources to decrease psychosocial stressors]       Follow up plan: Follow up call scheduled for 12/08/22    Encounter Outcome:  Pt. Visit Completed   Christa See, MSW, [email or web address] Phone [phone] 3:31 PM

## 2022-12-04 NOTE — Progress Notes (Signed)
  Care Coordination   Note   12/04/2022 Name: Karl Garcia MRN: [id number] DOB: 11-08-1960  [name] is a 62 y.o. year old male who sees Eubanks, Carlos American, NP for primary care. I reached out to [name] by phone today to offer care coordination services.  [name] was given information about Care Coordination services today including:   The Care Coordination services include support from the care team which includes your Nurse Coordinator, Clinical Social Worker, or Pharmacist.  The Care Coordination team is here to help remove barriers to the health concerns and goals most important to you. Care Coordination services are voluntary, and the patient may decline or stop services at any time by request to their care team member.   Care Coordination Consent Status: Patient agreed to services and verbal consent obtained.   Follow up plan:  Telephone appointment with care coordination team member scheduled for:  12/04/22  Encounter Outcome:  Pt. Scheduled  [place]  Direct Dial: [phone]

## 2022-12-08 ENCOUNTER — Ambulatory Visit: Payer: Self-pay | Admitting: Licensed Clinical Social Worker

## 2022-12-08 NOTE — Patient Instructions (Signed)
Visit Information  Thank you for taking time to visit with me today. Please don't hesitate to contact me if I can be of assistance to you.   Following are the goals we discussed today:   Goals Addressed             This Visit's Progress    Obtain Supportive Resources-Housing   On track    Activities and task to complete in order to accomplish goals.   Keep all upcoming appointments discussed today Continue with compliance of taking medication prescribed by Doctor Implement healthy coping skills discussed to assist with management of symptoms Continue working with [facility] care team to assist with goals identified Follow up with housing resources discussed          Our next appointment is by telephone on 4/11 at 3 PM  Please call the care guide team at [phone] if you need to cancel or reschedule your appointment.   If you are experiencing a Mental Health or [place] or need someone to talk to, please call the Suicide and Crisis Lifeline: 988 call 911   Patient verbalizes understanding of instructions and care plan provided today and agrees to view in [place]. Active MyChart status and patient understanding of how to access instructions and care plan via MyChart confirmed with patient.     Christa See, MSW, [email or web address] Phone [phone] 5:02 PM

## 2022-12-08 NOTE — Patient Outreach (Signed)
  Care Coordination   Follow Up Visit Note   12/08/2022 Name: Karl Garcia MRN: [id number] DOB: Aug 14, 1961  Karl Garcia is a 62 y.o. year old male who sees Eubanks, Carlos American, NP for primary care. I spoke with  Karl Garcia by phone today.  What matters to the patients health and wellness today?  Housing    Goals Addressed             This Visit's Progress    Obtain Supportive Resources-Housing   On track    Activities and task to complete in order to accomplish goals.   Keep all upcoming appointments discussed today Continue with compliance of taking medication prescribed by Doctor Implement healthy coping skills discussed to assist with management of symptoms Continue working with [facility] care team to assist with goals identified Follow up with housing resources discussed          SDOH assessments and interventions completed:  No     Care Coordination Interventions:  Yes, provided  Interventions Today    Flowsheet Row Most Recent Value  Chronic Disease   Chronic disease during today's visit Other  [NSTEMI, Tobacco and Alcohol use]  General Interventions   General Interventions Discussed/Reviewed General Interventions Reviewed, [facility] Resources identified in visiting [facility]. Patient plans to f/up this week]  Mental Health Interventions   Mental Health Discussed/Reviewed Coping Strategies  [Validation and encouragement provided]       Follow up plan: Follow up call scheduled for 1 week    Encounter Outcome:  Pt. Visit Completed   Christa See, MSW, [email or web address] Phone [phone] 5:01 PM

## 2022-12-09 ENCOUNTER — Encounter: Payer: Self-pay | Admitting: Internal Medicine

## 2022-12-09 ENCOUNTER — Ambulatory Visit: Payer: 59 | Attending: Internal Medicine | Admitting: Internal Medicine

## 2022-12-09 VITALS — BP 112/76 | HR 73 | Ht 72.0 in | Wt 181.8 lb

## 2022-12-09 DIAGNOSIS — F1010 Alcohol abuse, uncomplicated: Secondary | ICD-10-CM | POA: Diagnosis not present

## 2022-12-09 DIAGNOSIS — Z720 Tobacco use: Secondary | ICD-10-CM

## 2022-12-09 DIAGNOSIS — I252 Old myocardial infarction: Secondary | ICD-10-CM | POA: Diagnosis not present

## 2022-12-09 DIAGNOSIS — I214 Non-ST elevation (NSTEMI) myocardial infarction: Secondary | ICD-10-CM | POA: Diagnosis not present

## 2022-12-09 MED ORDER — ATORVASTATIN CALCIUM 20 MG PO TABS: 20.0000 mg | ORAL_TABLET | Freq: Every day | ORAL | 3 refills | Status: DC

## 2022-12-09 MED ORDER — ASPIRIN 81 MG PO TBEC: 81.0000 mg | DELAYED_RELEASE_TABLET | Freq: Every day | ORAL | 3 refills | Status: DC

## 2022-12-09 NOTE — Progress Notes (Signed)
Cardiology Office Note:    Date:  12/09/2022   ID:  Karl Garcia, DOB 01/27/1961, MRN [id number]  PCP:  Karl Chandler, NP  [facility] Cardiologist:  Dorris Carnes, MD    Chief Complaint:  Pt presents for follow up of CAD     Patient Profile:    Karl Garcia is a 62 y.o. male with:  Coronary artery disease S/p NSTEMI 02/2020 >> s/p CABG Echocardiogram 6/21: EF 55-60, Gr 2 DD, LAE Hypertension  Hyperlipidemia  Diabetes mellitus  Peripheral Arterial Disease L subclavian stenosis    Tobacco abuse Alcohol abuse      Prior CV studies: Pre-CABG Dopplers 02/16/2020 Bilateral ICA 1-39; left subclavian stenosis ABIs:  R 0.64; L 0.86   Echocardiogram 02/16/2020 EF 55-60, normal wall motion, mild LVH, GRII DD, normal RV SF, moderate LAE, trivial MR   Cardiac catheterization 02/16/2020 LM ostial 25 LAD ostial 70, mid 90, distal 90 RI 90 LCx proximal 60 RCA proximal 65-45-50  History of Present Illness:    Karl Garcia was last seen in cardiology by Karl Garcia in Dec 2021    He is s/p CABG as noted above   He has also been seen by Karl Garcia and Karl Garcia for PAD  He was last seen in 2022   He has an appt pending later this month  Since he was last seen in clinic he  denies CP  He says his breathing is OK     Activity is lmited by cramping in legs   He can walk 50 to 62 yo and will get cramping   L arm fingers numb all the time   Can straightne pinky  Started 6 month to year after surgery   Diet : Breakfast:   Coffee  with 1/2and 1/2  Banana and peanut butter nabs  Cheese toast Lunch    Sandwich    Water    Dinner   Varies   Tries to satay away from fried food   Meat and veggies    Water  Past Medical History:  Diagnosis Date   Coronary artery disease    S/p NSTEMI 02/2020 >> s/p CABG // Echocardiogram 6/21: EF 55-60, Gr 2 DD, LAE   Diabetes mellitus Type 2    Essential hypertension    ETOH abuse    Hx of NSTEMI in 02/2020    Hyperlipidemia    L subclavian stenosis     Peripheral Arterial Disease    Tobacco abuse     Current Medications: Current Meds  Medication Sig   [DISCONTINUED] aspirin EC 81 MG tablet Take 1 tablet (81 mg total) by mouth daily. Swallow whole.     Allergies:   Morphine and related   Social History   Tobacco Use   Smoking status: Every Day    Packs/day: 1.00    Years: 50.00    Additional pack years: 0.00    Total pack years: 50.00    Types: Cigarettes   Smokeless tobacco: Never   Tobacco comments:    Discussed with patient he is interested but not committed at this time  Vaping Use   Vaping Use: Never used  Substance Use Topics   Alcohol use: Yes    Alcohol/week: 5.0 standard drinks of alcohol    Types: 5 Shots of liquor per week    Comment: 5 drinks weekly   Drug use: Never     Family Hx: The patient's family history includes Aneurysm in his father;  Healthy in his brother; Heart attack in his father and mother; Heart disease in his father and paternal uncle; Heart failure in his father.  ROS   EKGs/Labs/Other Test Reviewed:    EKG:  EKG shows SR 73 bpm    Poss LA abnormality    Recent Labs: 12/03/2022: ALT 15; BUN 7; Creat 0.82; Hemoglobin 14.5; Platelets 139; Potassium 4.0; Sodium 140   Recent Lipid Panel Lab Results  Component Value Date/Time   CHOL 183 12/03/2022 10:55 AM   CHOL 125 04/17/2020 01:41 PM   TRIG 118 12/03/2022 10:55 AM   HDL 51 12/03/2022 10:55 AM   HDL 47 04/17/2020 01:41 PM   CHOLHDL 3.6 12/03/2022 10:55 AM   LDLCALC 109 (H) 12/03/2022 10:55 AM      Risk Assessment/Calculations:      Physical Exam:    VS:  BP 112/76   Pulse 73   Ht 6' (1.829 m)   Wt 181 lb 12.8 oz (82.5 kg)   SpO2 95%   BMI 24.66 kg/m     Wt Readings from Last 3 Encounters:  12/09/22 181 lb 12.8 oz (82.5 kg)  12/03/22 183 lb 3.2 oz (83.1 kg)  10/26/20 186 lb 11.2 oz (84.7 kg)    HEENT  NCAT Neck  JVP is normal   No bruits Lungs are CTA    Cardiac exam   RRR  No S3  No murmurs    Abd   no  hepatomegaly   No masses   Supple Ext   No edema      ASSESSMENT & PLAN:     1. CAD    Pt s/p NSTEMI and then CABG in 2021   Denies symptoms of angina     Off of all meds  I would recomm ASA 81 mg      Also recomm statin     2   PAD   Pt with signficant PAD   Appt soon at [facility]     3   Lipids    Will start lipitor 20 mg   Plan for f/u of lipids in 8 wk with liver panel  4   Hx HTN   BP is controlled  Follow     5  Tob   Counselled on cessation  will set up for noncontrast CT for Ca screening     5  PredDM   A1C 6.4   Off of meds   Counselled on diet  Recomm less processed foods, lots of veggies      Follow up in 1 year      Medication Adjustments/Labs and Tests Ordered: Current medicines are reviewed at length with the patient today.  Concerns regarding medicines are outlined above.  Tests Ordered: No orders of the defined types were placed in this encounter.  Medication Changes: No orders of the defined types were placed in this encounter.   Signed, Dorris Carnes, MD  12/09/2022 4:13 PM    [facility] [address], [place], [place]  [address] Phone: [phone]; Fax: [phone]

## 2022-12-09 NOTE — Patient Instructions (Signed)
Medication Instructions:  ASPIRIN 81 MG DAILY  LIPITOR 20 MG DAILY  *If you need a refill on your cardiac medications before your next appointment, please call your pharmacy*   Lab Work: NMR ND HEPATIC IN 8 WEEKS FASTING  If you have labs (blood work) drawn today and your tests are completely normal, you will receive your results only by: [place] (if you have MyChart) OR A paper copy in the mail If you have any lab test that is abnormal or we need to change your treatment, we will call you to review the results.   Testing/Procedures:  NON-CONTRAST CT OF THE CHEST    Follow-Up: At [facility], you and your health needs are our priority.  As part of our continuing mission to provide you with exceptional heart care, we have created designated Provider Care Teams.  These Care Teams include your primary Cardiologist (physician) and Advanced Practice Providers (APPs -  Physician Assistants and Nurse Practitioners) who all work together to provide you with the care you need, when you need it.  We recommend signing up for the patient portal called "MyChart".  Sign up information is provided on this After Visit Summary.  MyChart is used to connect with patients for Virtual Visits (Telemedicine).  Patients are able to view lab/test results, encounter notes, upcoming appointments, etc.  Non-urgent messages can be sent to your provider as well.   To learn more about what you can do with MyChart, go to [email or web address].    Your next appointment:   6 month(s)  Provider:   Dorris Carnes, MD     Other Instructions

## 2022-12-17 ENCOUNTER — Other Ambulatory Visit: Payer: Self-pay

## 2022-12-17 DIAGNOSIS — I739 Peripheral vascular disease, unspecified: Secondary | ICD-10-CM

## 2022-12-18 ENCOUNTER — Ambulatory Visit: Payer: Self-pay | Admitting: Licensed Clinical Social Worker

## 2022-12-18 NOTE — Patient Instructions (Signed)
Visit Information  Thank you for taking time to visit with me today. Please don't hesitate to contact me if I can be of assistance to you.   Following are the goals we discussed today:   Goals Addressed             This Visit's Progress    Obtain Supportive Resources-Housing   On track    Activities and task to complete in order to accomplish goals.   Keep all upcoming appointments discussed today Continue with compliance of taking medication prescribed by Doctor Implement healthy coping skills discussed to assist with management of symptoms Continue working with [facility] care team to assist with goals identified Follow up with housing resources discussed          Our next appointment is by telephone on 4/25 at 11:30 AM  Please call the care guide team at [phone] if you need to cancel or reschedule your appointment.   If you are experiencing a Mental Health or Behavioral Health Crisis or need someone to talk to, please call the Suicide and Crisis Lifeline: 988 call 911   Patient verbalizes understanding of instructions and care plan provided today and agrees to view in MyChart. Active MyChart status and patient understanding of how to access instructions and care plan via MyChart confirmed with patient.     Jenel Lucks, MSW, LCSW [facility] Care Management [place]  [facility] [email or web address] Phone [phone] 4:08 PM

## 2022-12-18 NOTE — Patient Outreach (Signed)
  Care Coordination   Follow Up Visit Note   12/18/2022 Name: Karl Garcia MRN: [id number] DOB: [birth year]  Karl Garcia is a 62 y.o. year old male who sees Eubanks, Janene Harvey, NP for primary care. I spoke with  Karl Garcia by phone today.  What matters to the patients health and wellness today?  Housing    Goals Addressed             This Visit's Progress    Obtain Supportive Resources-Housing   On track    Activities and task to complete in order to accomplish goals.   Keep all upcoming appointments discussed today Continue with compliance of taking medication prescribed by Doctor Implement healthy coping skills discussed to assist with management of symptoms Continue working with [facility] care team to assist with goals identified Follow up with housing resources discussed          SDOH assessments and interventions completed:  No     Care Coordination Interventions:  Yes, provided  Interventions Today    Flowsheet Row Most Recent Value  Chronic Disease   Chronic disease during today's visit Other  [NSTEMI,  Alcohol abuse,  Peripheral arterial disease]  General Interventions   General Interventions Discussed/Reviewed General Interventions Reviewed, [employer] obtained housing resources,  however, states concern that units were above his budget. Interested in information regarding rooms for rent]  Mental Health Interventions   Mental Health Discussed/Reviewed Mental Health Reviewed, Coping Strategies  [Pt continues to receive support from friend]       Follow up plan: Follow up call scheduled for 2 weeks    Encounter Outcome:  Pt. Visit Completed   Karl Garcia, MSW, LCSW [facility] Care Management [facility]  [facility] [email or web address] Phone [phone] 4:05 PM

## 2022-12-26 ENCOUNTER — Ambulatory Visit (INDEPENDENT_AMBULATORY_CARE_PROVIDER_SITE_OTHER): Payer: Commercial Managed Care - HMO | Admitting: Vascular Surgery

## 2022-12-26 ENCOUNTER — Ambulatory Visit (HOSPITAL_COMMUNITY)
Admission: RE | Admit: 2022-12-26 | Discharge: 2022-12-26 | Disposition: A | Discharge status: Home / Self Care | Payer: Commercial Managed Care - HMO | Source: Ambulatory Visit | Attending: Vascular Surgery | Admitting: Vascular Surgery

## 2022-12-26 ENCOUNTER — Encounter: Payer: Self-pay | Admitting: Vascular Surgery

## 2022-12-26 VITALS — BP 174/77 | HR 66 | Temp 97.6°F | Resp 18 | Ht 71.0 in | Wt 182.0 lb

## 2022-12-26 DIAGNOSIS — I70213 Atherosclerosis of native arteries of extremities with intermittent claudication, bilateral legs: Secondary | ICD-10-CM | POA: Diagnosis not present

## 2022-12-26 DIAGNOSIS — I739 Peripheral vascular disease, unspecified: Secondary | ICD-10-CM | POA: Diagnosis present

## 2022-12-26 NOTE — Patient Instructions (Signed)
1.) Visit your local pharmacy to update your covid and shingrix vaccines. Ask the pharmacy to fax a copy of your immunizations to [phone] to close care gaps.

## 2022-12-26 NOTE — Progress Notes (Signed)
Patient ID: Karl Garcia, male   DOB: 1960-10-25, 62 y.o.   MRN: [id number]  Reason for Consult: No chief complaint on file.   Referred by Sharon Seller, NP  Subjective:     HPI:  Karl Garcia is a 62 y.o. male well-known to me for right greater than left lower extremity claudication having undergone angiography with Dr. Gery Pray couple years ago.  He is a lifelong smoker with hyperlipidemia diabetes as well as coronary artery disease status post CABG. he continues to smoke approximately 1 pack/day.  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  Denies tissue loss or ulceration.  Past Medical History:  Diagnosis Date   Coronary artery disease    S/p NSTEMI 02/2020 >> s/p CABG // Echocardiogram 6/21: EF 55-60, Gr 2 DD, LAE   Diabetes mellitus Type 2    Essential hypertension    ETOH abuse    Hx of NSTEMI in 02/2020    Hyperlipidemia    L subclavian stenosis    Peripheral Arterial Disease    Tobacco abuse    Family History  Problem Relation Age of Onset   Heart attack Mother    Heart disease Father    Heart attack Father    Heart failure Father    Aneurysm Father    Healthy Brother    Heart disease Paternal Uncle    Past Surgical History:  Procedure Laterality Date   ABDOMINAL AORTOGRAM W/LOWER EXTREMITY Bilateral 07/16/2020   Procedure: ABDOMINAL AORTOGRAM W/LOWER EXTREMITY;  Surgeon: Runell Gess, MD;  [facility];  Service: Cardiovascular;  Laterality: Bilateral;   CORONARY ARTERY BYPASS GRAFT N/A 02/17/2020   Procedure: CORONARY ARTERY BYPASS GRAFTING (CABG) x Three, using left internal mammry artery and right leg greater saphenous vein harvested endoscopically;  Surgeon: Kerin Perna, MD;  Location: [facility] OR;  Service: Open Heart Surgery;  Laterality: N/A;   LEFT HEART CATH AND CORONARY  ANGIOGRAPHY N/A 02/16/2020   Procedure: LEFT HEART CATH AND CORONARY ANGIOGRAPHY;  Surgeon: Runell Gess, MD;  Location: [facility] INVASIVE CV LAB;  Service: Cardiovascular;  Laterality: N/A;   TEE WITHOUT CARDIOVERSION N/A 02/17/2020   Procedure: TRANSESOPHAGEAL ECHOCARDIOGRAM (TEE);  Surgeon: Donata Clay, Theron Arista, MD;  Location: [facility] OR;  Service: Open Heart Surgery;  Laterality: N/A;   TONSILECTOMY, ADENOIDECTOMY, BILATERAL MYRINGOTOMY AND TUBES     unsure of all of that but + tonsilectomy    Short Social History:  Social History   Tobacco Use   Smoking status: Every Day    Packs/day: 1.00    Years: 50.00    Additional pack years: 0.00    Total pack years: 50.00    Types: Cigarettes   Smokeless tobacco: Never   Tobacco comments:    Discussed with patient he is interested but not committed at this time  Substance Use Topics   Alcohol use: Yes    Alcohol/week: 5.0 standard drinks of alcohol    Types: 5 Shots of liquor per week    Comment: 5 drinks weekly    Allergies  Allergen Reactions   Morphine And Related Rash    Current Outpatient Medications  Medication Sig Dispense Refill   aspirin EC 81 MG tablet Take 1 tablet (81 mg total) by mouth daily. Swallow whole. 100 tablet 3   atorvastatin (  LIPITOR) 20 MG tablet Take 1 tablet (20 mg total) by mouth daily. 90 tablet 3   No current facility-administered medications for this visit.    Review of Systems  Constitutional:  Constitutional negative. HENT: HENT negative.  Eyes: Eyes negative.  Respiratory: Respiratory negative.  Cardiovascular: Positive for claudication and leg swelling.  GI: Gastrointestinal negative.  Musculoskeletal: Musculoskeletal negative. Positive for leg pain.  Skin: Skin negative.  Neurological: Positive for numbness.  Hematologic: Hematologic/lymphatic negative.  Psychiatric: Psychiatric negative.        Objective:  Objective   Vitals:   12/26/22 1109  BP: (!) 177/87  Pulse: 66  Resp: 18  Temp:  97.6 F (36.4 C)  SpO2: 96%     Physical Exam HENT:     Head: Normocephalic.     Nose: Nose normal.  Eyes:     Pupils: Pupils are equal, round, and reactive to light.  Cardiovascular:     Pulses:          Femoral pulses are 0 on the right side and 0 on the left side. Pulmonary:     Effort: Pulmonary effort is normal.  Abdominal:     General: Abdomen is flat.  Musculoskeletal:     Right lower leg: No edema.     Left lower leg: Edema present.  Skin:    Capillary Refill: Capillary refill takes more than 3 seconds.  Neurological:     General: No focal deficit present.     Mental Status: He is alert.  Psychiatric:        Mood and Affect: Mood normal.        Thought Content: Thought content normal.        Judgment: Judgment normal.     Data: ABI Findings:  +---------+------------------+-----+-------------------+-------------------  ----+  Right   Rt Pressure (mmHg)IndexWaveform           Comment                   +---------+------------------+-----+-------------------+-------------------  ----+  Brachial 157                                                                 +---------+------------------+-----+-------------------+-------------------  ----+  PTA     67                0.43 monophasic                                   +---------+------------------+-----+-------------------+-------------------  ----+  PERO                           absent                                       +---------+------------------+-----+-------------------+-------------------  ----+  DP      0                 0.00 dampened monophasicseverely dampened  monophasic, unable  to                                                       insonate                  +---------+------------------+-----+-------------------+-------------------  ----+  Great Toe67                0.43 Abnormal                                      +---------+------------------+-----+-------------------+-------------------  ----+   +---------+----------------+-----+------------------+----------------------  ----+  Left    Lt Pressure     IndexWaveform          Comment                               (mmHg)                                                              +---------+----------------+-----+------------------+----------------------  ----+  Brachial 116                                                                 +---------+----------------+-----+------------------+----------------------  ----+  PTA     107             0.68 monophasic                                     +---------+----------------+-----+------------------+----------------------  ----+  DP      138             0.88 dampened          severely dampened                                          monophasic        monophasic                   +---------+----------------+-----+------------------+----------------------  ----+  Great Toe89              0.57 Abnormal                                       +---------+----------------+-----+------------------+----------------------  ----+           Summary:  Right: Resting right ankle-brachial index indicates severe right lower  extremity arterial disease. The right toe-brachial index is abnormal.   Left: Resting left ankle-brachial index indicates mild left lower  extremity arterial disease,  falsley elevated due to calcified arteries.  The left toe-brachial index is abnormal.       Assessment/Plan:    62 year old male with disabling life-limiting claudication right greater than left lower extremity without tissue loss or ulceration.  I discussed with him the need for CT scan prior to any further intervention.  We in the past and discussed need for aortobifemoral bypass possibly we could perform common femoral  endarterectomy with at least the right possibly bilateral possible placed stents to improve his inflow but would know this better after CT scan.  Patient demonstrates good understanding we will get CT scan and follow-up afterwards.  I discussed the need for smoking cessation is the most important way to improve his life expectancy decreases risk of future amputation and he demonstrates good understanding although unlikely to occur before next visit.     Maeola Harman MD [facility]

## 2022-12-29 ENCOUNTER — Encounter: Payer: Self-pay | Admitting: Nurse Practitioner

## 2022-12-29 ENCOUNTER — Ambulatory Visit (INDEPENDENT_AMBULATORY_CARE_PROVIDER_SITE_OTHER): Payer: Commercial Managed Care - HMO | Admitting: Nurse Practitioner

## 2022-12-29 VITALS — BP 118/74 | HR 86 | Temp 97.7°F | Ht 71.0 in | Wt 181.0 lb

## 2022-12-29 DIAGNOSIS — Z0000 Encounter for general adult medical examination without abnormal findings: Secondary | ICD-10-CM

## 2022-12-29 DIAGNOSIS — Z1211 Encounter for screening for malignant neoplasm of colon: Secondary | ICD-10-CM

## 2022-12-29 DIAGNOSIS — Z125 Encounter for screening for malignant neoplasm of prostate: Secondary | ICD-10-CM

## 2022-12-29 DIAGNOSIS — E1169 Type 2 diabetes mellitus with other specified complication: Secondary | ICD-10-CM | POA: Diagnosis not present

## 2022-12-29 DIAGNOSIS — E119 Type 2 diabetes mellitus without complications: Secondary | ICD-10-CM | POA: Insufficient documentation

## 2022-12-29 NOTE — Progress Notes (Unsigned)
Careteam: Patient Care Team: Sharon Seller, NP as PCP - General (Geriatric Medicine) Pricilla Riffle, MD as PCP - Cardiology (Cardiology) Bridgett Larsson, LCSW as Social Worker (Licensed Clinical Social Worker)  PLACE OF SERVICE:  [facility]  Advanced Directive information Does Patient Have a Medical Advance Directive?: No, Would patient like information on creating a medical advance directive?: No - Patient declined  Allergies  Allergen Reactions   Morphine And Related Rash    Chief Complaint  Patient presents with   Medicare Wellness    Annual wellness visit. Discuss need for colonoscopy, shingrix, and covid boosters or post pone if patient refuses or is not a candidate. NCIR verified. Discuss elevated b/p at u/s visit.      HPI: Patient is a 62 y.o. male for preventative healthcare.   He has seen cardiologist and vascular since last visit.  Continues on ASA and lipitor.   He does not drink ETOH, drinking 5-6 drinks weekly. It is not everyday.  Smokes a ppd of cigarettes.   He does not have an eye doctor.   Continues to stay at friends house. Does not have a job.  No problem getting his medications.  No issues with getting food.   Review of Systems:  Review of Systems  Constitutional:  Negative for chills, fever and weight loss.  HENT:  Negative for tinnitus.   Respiratory:  Negative for cough, sputum production and shortness of breath.   Cardiovascular:  Negative for chest pain, palpitations and leg swelling.  Gastrointestinal:  Negative for abdominal pain, constipation, diarrhea and heartburn.  Genitourinary:  Negative for dysuria, frequency and urgency.  Musculoskeletal:  Negative for back pain, falls, joint pain and myalgias.  Skin: Negative.   Neurological:  Negative for dizziness and headaches.  Psychiatric/Behavioral:  Negative for depression and memory loss. The patient does not have insomnia.     Past Medical History:  Diagnosis Date    Coronary artery disease    S/p NSTEMI 02/2020 >> s/p CABG // Echocardiogram 6/21: EF 55-60, Gr 2 DD, LAE   Diabetes mellitus Type 2    Essential hypertension    ETOH abuse    Hx of NSTEMI in 02/2020    Hyperlipidemia    L subclavian stenosis    Peripheral Arterial Disease    Tobacco abuse    Past Surgical History:  Procedure Laterality Date   ABDOMINAL AORTOGRAM W/LOWER EXTREMITY Bilateral 07/16/2020   Procedure: ABDOMINAL AORTOGRAM W/LOWER EXTREMITY;  Surgeon: Runell Gess, MD;  [facility];  Service: Cardiovascular;  Laterality: Bilateral;   CORONARY ARTERY BYPASS GRAFT N/A 02/17/2020   Procedure: CORONARY ARTERY BYPASS GRAFTING (CABG) x Three, using left internal mammry artery and right leg greater saphenous vein harvested endoscopically;  Surgeon: Kerin Perna, MD;  Location: [facility] OR;  Service: Open Heart Surgery;  Laterality: N/A;   LEFT HEART CATH AND CORONARY ANGIOGRAPHY N/A 02/16/2020   Procedure: LEFT HEART CATH AND CORONARY ANGIOGRAPHY;  Surgeon: Runell Gess, MD;  [facility]: [facility];  Service: Cardiovascular;  Laterality: N/A;   TEE WITHOUT CARDIOVERSION N/A 02/17/2020   Procedure: TRANSESOPHAGEAL ECHOCARDIOGRAM (TEE);  Surgeon: Donata Clay, Theron Arista, MD;  Location: [facility] OR;  Service: Open Heart Surgery;  Laterality: N/A;   TONSILECTOMY, ADENOIDECTOMY, BILATERAL MYRINGOTOMY AND TUBES     unsure of all of that but + tonsilectomy   Social History:   reports that he has been smoking cigarettes. He has a 50.00 pack-year smoking  history. He has never used smokeless tobacco. He reports current alcohol use of about 5.0 standard drinks of alcohol per week. He reports that he does not use drugs.  Family History  Problem Relation Age of Onset   Heart attack Mother    Heart disease Father    Heart attack Father    Heart failure Father    Aneurysm Father    Healthy Brother    Heart disease Paternal Uncle     Medications: Patient's Medications  New  Prescriptions   No medications on file  Previous Medications   ASPIRIN EC 81 MG TABLET    Take 1 tablet (81 mg total) by mouth daily. Swallow whole.   ATORVASTATIN (LIPITOR) 20 MG TABLET    Take 1 tablet (20 mg total) by mouth daily.  Modified Medications   No medications on file  Discontinued Medications   No medications on file    Physical Exam:  Vitals:   12/29/22 1327  BP: 118/74  Pulse: 86  Temp: 97.7 F (36.5 C)  TempSrc: Temporal  SpO2: 97%  Weight: 181 lb (82.1 kg)  Height:  (1.803 m)   Body mass index is 25.24 kg/m. Wt Readings from Last 3 Encounters:  12/29/22 181 lb (82.1 kg)  12/26/22 182 lb (82.6 kg)  12/09/22 181 lb 12.8 oz (82.5 kg)    Physical Exam Constitutional:      General: He is not in acute distress.    Appearance: He is well-developed. He is not diaphoretic.  HENT:     Head: Normocephalic and atraumatic.     Right Ear: External ear normal.     Left Ear: External ear normal.     Mouth/Throat:     Pharynx: No oropharyngeal exudate.  Eyes:     Conjunctiva/sclera: Conjunctivae normal.     Pupils: Pupils are equal, round, and reactive to light.  Cardiovascular:     Rate and Rhythm: Normal rate and regular rhythm.     Heart sounds: Normal heart sounds.  Pulmonary:     Effort: Pulmonary effort is normal.     Breath sounds: Normal breath sounds.  Abdominal:     General: Bowel sounds are normal.     Palpations: Abdomen is soft.  Musculoskeletal:        General: No tenderness.     Cervical back: Normal range of motion and neck supple.     Right lower leg: No edema.     Left lower leg: No edema.  Skin:    General: Skin is warm and dry.  Neurological:     Mental Status: He is alert and oriented to person, place, and time.   ***  Labs reviewed: Basic Metabolic Panel: Recent Labs    12/03/22 1055  NA 140  K 4.0  CL 104  CO2 30  GLUCOSE 105*  BUN 7  CREATININE 0.82  CALCIUM 8.8   Liver Function Tests: Recent Labs     12/03/22 1055  AST 21  ALT 15  BILITOT 0.5  PROT 6.6   No results for input(s): "LIPASE", "AMYLASE" in the last 8760 hours. No results for input(s): "AMMONIA" in the last 8760 hours. CBC: Recent Labs    12/03/22 1055  WBC 5.4  NEUTROABS 3,629  HGB 14.5  HCT 41.7  MCV 99.5  PLT 139*   Lipid Panel: Recent Labs    12/03/22 1055  CHOL 183  HDL 51  LDLCALC 109*  TRIG 118  CHOLHDL 3.6   TSH:  No results for input(s): "TSH" in the last 8760 hours. A1C: Lab Results  Component Value Date   HGBA1C 6.4 (H) 12/03/2022     Assessment/Plan 1. Preventative health care *** - Microalbumin/Creatinine Ratio, Urine - Ambulatory referral to Ophthalmology  2. Colon cancer screening *** - Ambulatory referral to gastroenterology for colonoscopy  3. Prostate cancer screening *** - PSA  4. Type 2 diabetes mellitus with other specified complication, without long-term current use of insulin *** - Microalbumin/Creatinine Ratio, Urine - Ambulatory referral to Ophthalmology   Janene Harvey. Biagio Borg [facility] [phone]

## 2022-12-30 ENCOUNTER — Other Ambulatory Visit: Payer: Self-pay | Admitting: Nurse Practitioner

## 2022-12-30 ENCOUNTER — Ambulatory Visit (HOSPITAL_COMMUNITY): Admission: RE | Admit: 2022-12-30 | Payer: Medicaid Other | Source: Ambulatory Visit

## 2022-12-30 DIAGNOSIS — R972 Elevated prostate specific antigen [PSA]: Secondary | ICD-10-CM

## 2022-12-30 LAB — MICROALBUMIN / CREATININE URINE RATIO
Creatinine, Urine: 21 mg/dL (ref 20–320)
Microalb Creat Ratio: 367 mg/g creat — ABNORMAL HIGH (ref <30)
Microalb, Ur: 7.7 mg/dL

## 2022-12-30 LAB — PSA: PSA: 73.71 ng/mL — ABNORMAL HIGH (ref <=4.00)

## 2023-01-01 ENCOUNTER — Ambulatory Visit: Payer: Self-pay | Admitting: Licensed Clinical Social Worker

## 2023-01-01 NOTE — Patient Instructions (Signed)
Visit Information  Thank you for taking time to visit with me today. Please don't hesitate to contact me if I can be of assistance to you.   Following are the goals we discussed today:   Goals Addressed             This Visit's Progress    Obtain Supportive Resources-Housing   On track    Activities and task to complete in order to accomplish goals.   Keep all upcoming appointments discussed today Continue with compliance of taking medication prescribed by Doctor Implement healthy coping skills discussed to assist with management of symptoms Continue working with [facility] care team to assist with goals identified Follow up with housing resources discussed          Please call the care guide team at [phone] if you need to cancel or reschedule your appointment.   If you are experiencing a Mental Health or Behavioral Health Crisis or need someone to talk to, please call the Suicide and Crisis Lifeline: 988 call 911   Patient verbalizes understanding of instructions and care plan provided today and agrees to view in MyChart. Active MyChart status and patient understanding of how to access instructions and care plan via MyChart confirmed with patient.     Jenel Lucks, MSW, LCSW [facility] Care Management [place]  [facility] [email or web address] Phone [phone] 3:42 PM

## 2023-01-01 NOTE — Patient Outreach (Signed)
  Care Coordination   Follow Up Visit Note   01/01/2023 Name: KWELI GRASSEL MRN: [id number] DOB: 18-Oct-1960  [name] is a 62 y.o. year old male who sees Eubanks, Janene Harvey, NP for primary care. I spoke with  [name] by phone today.  What matters to the patients health and wellness today?  Housing    Goals Addressed             This Visit's Progress    Obtain Supportive Resources-Housing   On track    Activities and task to complete in order to accomplish goals.   Keep all upcoming appointments discussed today Continue with compliance of taking medication prescribed by Doctor Implement healthy coping skills discussed to assist with management of symptoms Continue working with [facility] care team to assist with goals identified Follow up with housing resources discussed          SDOH assessments and interventions completed:  No     Care Coordination Interventions:  Yes, provided  Interventions Today    Flowsheet Row Most Recent Value  General Interventions   General Interventions Discussed/Reviewed General Interventions Reviewed, [employer] provided update regarding housing resources. Patient is agreeable to applying for [employer] for income based housing]  Mental Health Interventions   Mental Health Discussed/Reviewed Mental Health Reviewed, Coping Strategies       Follow up plan: Follow up call scheduled for 1-2 weeks    Encounter Outcome:  Pt. Visit Completed   Jenel Lucks, MSW, LCSW [facility] Care Management [facility]  [facility] [email or web address] Phone [phone] 3:41 PM

## 2023-01-06 ENCOUNTER — Ambulatory Visit (HOSPITAL_BASED_OUTPATIENT_CLINIC_OR_DEPARTMENT_OTHER)
Admission: RE | Admit: 2023-01-06 | Discharge: 2023-01-06 | Disposition: A | Discharge status: Home / Self Care | Payer: Commercial Managed Care - HMO | Source: Ambulatory Visit | Attending: Internal Medicine | Admitting: Internal Medicine

## 2023-01-06 DIAGNOSIS — Z720 Tobacco use: Secondary | ICD-10-CM | POA: Diagnosis not present

## 2023-01-06 DIAGNOSIS — I214 Non-ST elevation (NSTEMI) myocardial infarction: Secondary | ICD-10-CM | POA: Diagnosis present

## 2023-01-08 ENCOUNTER — Encounter: Payer: Self-pay | Admitting: Licensed Clinical Social Worker

## 2023-01-08 NOTE — Patient Outreach (Signed)
  Care Coordination   Follow Up Visit Note   01/08/2023 Name: Karl Garcia MRN: [id number] DOB: July 28, 1961  Karl Garcia is a 62 y.o. year old male who sees Eubanks, Janene Harvey, NP for primary care. Patient was not engaged during this encounter  What matters to the patients health and wellness today?  Housing    Goals Addressed             This Visit's Progress    Obtain Supportive Resources-Housing   On track    Activities and task to complete in order to accomplish goals.   Keep all upcoming appointments discussed today Continue with compliance of taking medication prescribed by Doctor Implement healthy coping skills discussed to assist with management of symptoms Continue working with [facility] care team to assist with goals identified Complete application for [employer], provided via email         SDOH assessments and interventions completed:  No     Care Coordination Interventions:  Yes, provided  Interventions Today    Flowsheet Row Most Recent Value  General Interventions   General Interventions Discussed/Reviewed [employer] provided application link for [employer] to pt via email]       Follow up plan: Follow up call scheduled for 1-4 weeks    Encounter Outcome:  Pt. Visit Completed   Jenel Lucks, MSW, LCSW [facility] Care Management [facility]  [facility] [email or web address] Phone [phone] 5:05 PM

## 2023-01-08 NOTE — Patient Instructions (Signed)
Visit Information  Thank you for taking time to visit with me today. Please don't hesitate to contact me if I can be of assistance to you.   Following are the goals we discussed today:   Goals Addressed             This Visit's Progress    Obtain Supportive Resources-Housing   On track    Activities and task to complete in order to accomplish goals.   Keep all upcoming appointments discussed today Continue with compliance of taking medication prescribed by Doctor Implement healthy coping skills discussed to assist with management of symptoms Continue working with [facility] care team to assist with goals identified Complete application for [employer], provided via email        Please call the care guide team at [phone] if you need to cancel or reschedule your appointment.   If you are experiencing a Mental Health or Behavioral Health Crisis or need someone to talk to, please call the Suicide and Crisis Lifeline: 988 call 911   Patient verbalizes understanding of instructions and care plan provided today and agrees to view in MyChart. Active MyChart status and patient understanding of how to access instructions and care plan via MyChart confirmed with patient.     Jenel Lucks, MSW, LCSW [facility] Care Management [place]  [facility] [email or web address] Phone [phone] 5:06 PM

## 2023-01-14 ENCOUNTER — Telehealth: Payer: Self-pay

## 2023-01-14 DIAGNOSIS — R911 Solitary pulmonary nodule: Secondary | ICD-10-CM

## 2023-01-14 NOTE — Telephone Encounter (Signed)
Non- Contrast CT ordered for one year to follow up his new 5 mm nodule in his RML per Dr Tenny Craw.

## 2023-01-14 NOTE — Telephone Encounter (Signed)
-----   Message from Pricilla Riffle, MD sent at 01/09/2023 12:59 PM EDT ----- CT shows new nodule 5 mm on RML REcomm follow up CT in 1 year (noncontrast)

## 2023-01-20 ENCOUNTER — Telehealth: Payer: Self-pay

## 2023-01-20 NOTE — Telephone Encounter (Signed)
Pt called to f/u on scheduling his CT scan. Pt's insurance is requiring a peer to peer with MD. This has not been set up yet. I have let pt know this.

## 2023-01-21 ENCOUNTER — Ambulatory Visit: Payer: Medicaid Other | Admitting: Vascular Surgery

## 2023-01-23 ENCOUNTER — Other Ambulatory Visit: Payer: Self-pay

## 2023-01-23 DIAGNOSIS — I70213 Atherosclerosis of native arteries of extremities with intermittent claudication, bilateral legs: Secondary | ICD-10-CM

## 2023-01-28 ENCOUNTER — Ambulatory Visit (HOSPITAL_COMMUNITY)
Admission: RE | Admit: 2023-01-28 | Discharge: 2023-01-28 | Disposition: A | Discharge status: Home / Self Care | Payer: Medicaid Other | Source: Ambulatory Visit | Attending: Vascular Surgery | Admitting: Vascular Surgery

## 2023-01-28 DIAGNOSIS — I743 Embolism and thrombosis of arteries of the lower extremities: Secondary | ICD-10-CM | POA: Diagnosis not present

## 2023-01-28 DIAGNOSIS — I701 Atherosclerosis of renal artery: Secondary | ICD-10-CM | POA: Diagnosis not present

## 2023-01-28 DIAGNOSIS — I70213 Atherosclerosis of native arteries of extremities with intermittent claudication, bilateral legs: Secondary | ICD-10-CM | POA: Diagnosis not present

## 2023-01-28 DIAGNOSIS — I70201 Unspecified atherosclerosis of native arteries of extremities, right leg: Secondary | ICD-10-CM | POA: Diagnosis not present

## 2023-01-28 LAB — POCT I-STAT CREATININE: Creatinine, Ser: 1.1 mg/dL (ref 0.61–1.24)

## 2023-01-28 MED ORDER — IOHEXOL 350 MG/ML SOLN
100.0000 mL | Freq: Once | INTRAVENOUS | Status: AC | PRN
  Administered 2023-01-28: 100 mL via INTRAVENOUS

## 2023-02-03 ENCOUNTER — Ambulatory Visit: Payer: Medicaid Other | Attending: Internal Medicine

## 2023-02-04 ENCOUNTER — Encounter: Payer: Self-pay | Admitting: Vascular Surgery

## 2023-02-04 ENCOUNTER — Ambulatory Visit (INDEPENDENT_AMBULATORY_CARE_PROVIDER_SITE_OTHER): Payer: 59 | Admitting: Vascular Surgery

## 2023-02-04 VITALS — BP 120/84 | HR 83 | Temp 98.3°F | Resp 20 | Ht 71.0 in | Wt 178.0 lb

## 2023-02-04 DIAGNOSIS — I70213 Atherosclerosis of native arteries of extremities with intermittent claudication, bilateral legs: Secondary | ICD-10-CM

## 2023-02-04 NOTE — Progress Notes (Signed)
Patient ID: Karl Garcia, male   DOB: 01-25-1961, 62 y.o.   MRN: [id number]  Reason for Consult: Follow-up   Referred by Sharon Seller, NP  Subjective:     HPI:  Karl Garcia is a 62 y.o. male with history of bilateral lower extremity short distance claudication.  He is a lifelong smoker continues to smoke daily and also has hyperlipidemia and diabetes and has a history of coronary artery bypass grafting.  Smoking 1 pack/day.  At last visit he was having disabling claudication at about 50 yards states that this is only gotten worse since that time.  He does have swelling of the left leg and has recently had swelling of the right leg but it resolved.  He does not have any tissue loss or ulceration.  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  He does take aspirin and statin no other blood thinners.  Past Medical History:  Diagnosis Date   Coronary artery disease    S/p NSTEMI 02/2020 >> s/p CABG // Echocardiogram 6/21: EF 55-60, Gr 2 DD, LAE   Diabetes mellitus Type 2    Essential hypertension    ETOH abuse    Hx of NSTEMI in 02/2020    Hyperlipidemia    L subclavian stenosis    Peripheral Arterial Disease    Tobacco abuse    Family History  Problem Relation Age of Onset   Heart attack Mother    Heart disease Father    Heart attack Father    Heart failure Father    Aneurysm Father    Healthy Brother    Heart disease Paternal Uncle    Past Surgical History:  Procedure Laterality Date   ABDOMINAL AORTOGRAM W/LOWER EXTREMITY Bilateral 07/16/2020   Procedure: ABDOMINAL AORTOGRAM W/LOWER EXTREMITY;  Surgeon: Runell Gess, MD;  Location: [facility] INVASIVE CV LAB;  Service: Cardiovascular;  Laterality: Bilateral;   CORONARY ARTERY BYPASS GRAFT N/A 02/17/2020   Procedure: CORONARY ARTERY BYPASS GRAFTING (CABG) x Three, using left internal mammry artery and right leg greater saphenous vein harvested endoscopically;   Surgeon: Kerin Perna, MD;  Location: [facility] OR;  Service: Open Heart Surgery;  Laterality: N/A;   LEFT HEART CATH AND CORONARY ANGIOGRAPHY N/A 02/16/2020   Procedure: LEFT HEART CATH AND CORONARY ANGIOGRAPHY;  Surgeon: Runell Gess, MD;  Location: [facility] INVASIVE CV LAB;  Service: Cardiovascular;  Laterality: N/A;   TEE WITHOUT CARDIOVERSION N/A 02/17/2020   Procedure: TRANSESOPHAGEAL ECHOCARDIOGRAM (TEE);  Surgeon: Donata Clay, Theron Arista, MD;  Location: [facility] OR;  Service: Open Heart Surgery;  Laterality: N/A;   TONSILECTOMY, ADENOIDECTOMY, BILATERAL MYRINGOTOMY AND TUBES     unsure of all of that but + tonsilectomy    Short Social History:  Social History   Tobacco Use   Smoking status: Every Day    Packs/day: 1.00    Years: 50.00    Additional pack years: 0.00    Total pack years: 50.00    Types: Cigarettes   Smokeless tobacco: Never   Tobacco comments:    Discussed with patient he is interested but not committed at this time  Substance Use Topics   Alcohol use: Yes    Alcohol/week: 5.0 standard drinks of alcohol    Types: 5 Shots of liquor per week    Comment: 5 drinks weekly    Allergies  Allergen Reactions   Morphine And Codeine Rash    Current Outpatient  Medications  Medication Sig Dispense Refill   aspirin EC 81 MG tablet Take 1 tablet (81 mg total) by mouth daily. Swallow whole. 100 tablet 3   atorvastatin (LIPITOR) 20 MG tablet Take 1 tablet (20 mg total) by mouth daily. 90 tablet 3   No current facility-administered medications for this visit.    Review of Systems  Constitutional:  Constitutional negative. HENT: HENT negative.  Eyes: Eyes negative.  Cardiovascular: Positive for claudication and leg swelling.  GI: Gastrointestinal negative.  Musculoskeletal: Positive for leg pain.  Neurological: Neurological negative. Hematologic: Hematologic/lymphatic negative.  Psychiatric: Psychiatric negative.        Objective:  Objective   Vitals:   02/04/23 1503  BP:  120/84  Pulse: 83  Resp: 20  Temp: 98.3 F (36.8 C)  SpO2: 94%  Weight: 178 lb (80.7 kg)  Height: 5\' 11"  (1.803 m)   Body mass index is 24.83 kg/m.  Physical Exam HENT:     Head: Normocephalic.     Nose: Nose normal.  Eyes:     Pupils: Pupils are equal, round, and reactive to light.  Cardiovascular:     Rate and Rhythm: Normal rate.     Pulses:          Femoral pulses are 0 on the right side and 0 on the left side. Abdominal:     General: Abdomen is flat.     Palpations: Abdomen is soft.  Musculoskeletal:     Right lower leg: No edema.     Left lower leg: Edema present.  Skin:    General: Skin is dry.  Neurological:     General: No focal deficit present.     Mental Status: He is alert.  Psychiatric:        Mood and Affect: Mood normal.        Thought Content: Thought content normal.        Judgment: Judgment normal.     Data: CT IMPRESSION: VASCULAR   1. 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 2. 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 3. Bulky calcified atherosclerotic plaque in the right common femoral artery results in high-grade stenosis which extends into the proximal superficial femoral artery. 4. Bulky calcified atherosclerotic plaque results in high-grade stenosis throughout the left common iliac artery. The left external iliac artery remains patent and without significant stenosis. 5. 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 6. 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 7. Aortic and coronary artery atherosclerotic vascular calcifications. Aortic Atherosclerosis (ICD10-I70.0).     Assessment/Plan:    62 year old male with high-grade aortoiliac and common femoral stenoses with bilateral lower extremity claudication that is life-limiting from a right lower extremity standpoint but also  occurs on the left.  Is really affecting his work at this time and he is here to discuss options for improving blood flow.  We reviewed both his CT scan and his angiogram from a few years ago.  He would be best served with aortobifemoral bypass and common femoral endarterectomy at least on the right side.  He will need cardiac clearance prior.  He can continue aspirin and statin perioperatively.  Will get this scheduled in the near future after cardiac clearance.  All risk benefits alternatives as well as expected outcomes were discussed at length with the patient today and he demonstrates good understanding.  He understands that he will be  out of work for 6 to 8 weeks and will possibly need rehab after the surgery with hospitalization to include 7 to 10 days.     Maeola Harman MD [facility]

## 2023-02-05 ENCOUNTER — Telehealth: Payer: Self-pay | Admitting: *Deleted

## 2023-02-05 NOTE — Telephone Encounter (Signed)
     Primary Cardiologist: Dietrich Pates, MD  Chart reviewed as part of pre-operative protocol coverage. Given past medical history and time since last visit, based on ACC/AHA guidelines, Karl Garcia would be at acceptable risk for the planned procedure without further cardiovascular testing.   His RCRI is a class III risk, 6.6% risk of major cardiac event.  His aspirin may be held for 5 to 7 days prior to his surgery.  Please resume as soon as hemostasis is achieved.  I will route this recommendation to the requesting party via Epic fax function and remove from pre-op pool.  Please call with questions.   Thomasene Ripple. Eleanor Dimichele NP-C     02/05/2023, 3:21 PM [facility] [address] Office [phone] Fax [phone]

## 2023-02-05 NOTE — Telephone Encounter (Signed)
   Pre-operative Risk Assessment    Patient Name: Karl Garcia  DOB: May 19, 1961 MRN: [id number]      Request for Surgical Clearance    Procedure:   AORTO BIFEMORAL BYPASS  Date of Surgery:  Clearance TBD                                  Surgeon:  DR. Lemar Livings Surgeon's Group or Practice Name:  [facility] Phone number:  [phone] Fax number:  [phone]   Type of Clearance Requested:   - Medical ; ASA    Type of Anesthesia:  General    Additional requests/questions:    [name]   02/05/2023, 2:46 PM

## 2023-02-06 ENCOUNTER — Other Ambulatory Visit: Payer: Self-pay

## 2023-02-06 DIAGNOSIS — I70221 Atherosclerosis of native arteries of extremities with rest pain, right leg: Secondary | ICD-10-CM

## 2023-03-03 NOTE — Progress Notes (Signed)
Surgical Instructions    Your procedure is scheduled on Tuesday July 9th.  Report to [facility] Main Entrance "A" at 5:30 A.M., then check in with the Admitting office.  Call this number if you have problems the morning of surgery:  [phone]   If you have any questions prior to your surgery date call [phone]: Open Monday-Friday 8am-4pm If you experience any cold or flu symptoms such as cough, fever, chills, shortness of breath, etc. between now and your scheduled surgery, please notify us at the above number     Remember:  Do not eat or drink after midnight the night before your surgery     Take these medicines the morning of surgery with A SIP OF WATER: Aspirin Lipitor    As of today, STOP taking any Aspirin (unless otherwise instructed by your surgeon) Aleve, Naproxen, Ibuprofen, Motrin, Advil, Goody's, BC's, all herbal medications, fish oil, and all vitamins.     HOW TO MANAGE YOUR DIABETES BEFORE AND AFTER SURGERY  Why is it important to control my blood sugar before and after surgery? Improving blood sugar levels before and after surgery helps healing and can limit problems. A way of improving blood sugar control is eating a healthy diet by:  Eating less sugar and carbohydrates  Increasing activity/exercise  Talking with your doctor about reaching your blood sugar goals High blood sugars (greater than 180 mg/dL) can raise your risk of infections and slow your recovery, so you will need to focus on controlling your diabetes during the weeks before surgery. Make sure that the doctor who takes care of your diabetes knows about your planned surgery including the date and location.  How do I manage my blood sugar before surgery? Check your blood sugar at least 4 times a day, starting 2 days before surgery, to make sure that the level is not too high or low.  Check your blood sugar the morning of your surgery when you wake up and every 2 hours until you get to the  Short Stay unit.  If your blood sugar is less than 70 mg/dL, you will need to treat for low blood sugar: Do not take insulin. Treat a low blood sugar (less than 70 mg/dL) with  cup of clear juice (cranberry or apple), 4 glucose tablets, OR glucose gel. Recheck blood sugar in 15 minutes after treatment (to make sure it is greater than 70 mg/dL). If your blood sugar is not greater than 70 mg/dL on recheck, call [phone] for further instructions. Report your blood sugar to the short stay nurse when you get to Short Stay.  If you are admitted to the hospital after surgery: Your blood sugar will be checked by the staff and you will probably be given insulin after surgery (instead of oral diabetes medicines) to make sure you have good blood sugar levels. The goal for blood sugar control after surgery is 80-180 mg/dL.        DAY OF SURGERY    Do not wear jewelry  Do not wear lotions, powders, cologne or deodorant. Do not shave 48 hours prior to surgery.  Men may shave face and neck. Do not bring valuables to the hospital. Do not wear nail polish   [facility] is not responsible for any belongings or valuables.    Do NOT Smoke (Tobacco/Vaping)  24 hours prior to your procedure  If you use a CPAP at night, you may bring your mask for your overnight stay.   Contacts, glasses, hearing  aids, dentures or partials may not be worn into surgery, please bring cases for these belongings   For patients admitted to the hospital, discharge time will be determined by your treatment team.   Patients discharged the day of surgery will not be allowed to drive home, and someone needs to stay with them for 24 hours.   SURGICAL WAITING ROOM VISITATION Patients having surgery or a procedure may have no more than 2 support people in the waiting area - these visitors may rotate.   Children under the age of 50 must have an adult with them who is not the patient. If the patient needs to stay at the hospital  during part of their recovery, the visitor guidelines for inpatient rooms apply. Pre-op nurse will coordinate an appropriate time for 1 support person to accompany patient in pre-op.  This support person may not rotate.   Please refer to [email or web address] for the visitor guidelines for Inpatients (after your surgery is over and you are in a regular room).    Special instructions:    Oral Hygiene is also important to reduce your risk of infection.  Remember - BRUSH YOUR TEETH THE MORNING OF SURGERY WITH YOUR REGULAR TOOTHPASTE   [place]- Preparing For Surgery  Before surgery, you can play an important role. Because skin is not sterile, your skin needs to be as free of germs as possible. You can reduce the number of germs on your skin by washing with CHG (chlorahexidine gluconate) Soap before surgery.  CHG is an antiseptic cleaner which kills germs and bonds with the skin to continue killing germs even after washing.     Please do not use if you have an allergy to CHG or antibacterial soaps. If your skin becomes reddened/irritated stop using the CHG.  Do not shave (including legs and underarms) for at least 48 hours prior to first CHG shower. It is OK to shave your face.  Please follow these instructions carefully.     Shower the NIGHT BEFORE SURGERY and the MORNING OF SURGERY with CHG Soap.   If you chose to wash your hair, wash your hair first as usual with your normal shampoo. After you shampoo, rinse your hair and body thoroughly to remove the shampoo.  Then [employer] and genitals (private parts) with your normal soap and rinse thoroughly to remove soap.  After that Use CHG Soap as you would any other liquid soap. You can apply CHG directly to the skin and wash gently with a scrungie or a clean washcloth.   Apply the CHG Soap to your body ONLY FROM THE NECK DOWN.  Do not use on open wounds or open sores. Avoid contact with your  eyes, ears, mouth and genitals (private parts). Wash Face and genitals (private parts)  with your normal soap.   Wash thoroughly, paying special attention to the area where your surgery will be performed.  Thoroughly rinse your body with warm water from the neck down.  DO NOT shower/wash with your normal soap after using and rinsing off the CHG Soap.  Pat yourself dry with a CLEAN TOWEL.  Wear CLEAN PAJAMAS to bed the night before surgery  Place CLEAN SHEETS on your bed the night before your surgery  DO NOT SLEEP WITH PETS.   Day of Surgery:  Take a shower with CHG soap. Wear Clean/Comfortable clothing the morning of surgery Do not apply any deodorants/lotions.   Remember to brush your teeth WITH YOUR REGULAR TOOTHPASTE.  If you received a COVID test during your pre-op visit, it is requested that you wear a mask when out in public, stay away from anyone that may not be feeling well, and notify your surgeon if you develop symptoms. If you have been in contact with anyone that has tested positive in the last 10 days, please notify your surgeon.    Please read over the following fact sheets that you were given.

## 2023-03-04 ENCOUNTER — Other Ambulatory Visit: Payer: Self-pay

## 2023-03-04 ENCOUNTER — Encounter (HOSPITAL_COMMUNITY)
Admission: RE | Admit: 2023-03-04 | Discharge: 2023-03-04 | Disposition: A | Discharge status: Home / Self Care | Payer: 59 | Source: Ambulatory Visit | Attending: Vascular Surgery | Admitting: Vascular Surgery

## 2023-03-04 ENCOUNTER — Encounter (HOSPITAL_COMMUNITY): Payer: Self-pay

## 2023-03-04 VITALS — BP 141/109 | HR 95 | Temp 98.5°F | Resp 18 | Ht 72.0 in | Wt 179.0 lb

## 2023-03-04 DIAGNOSIS — I70221 Atherosclerosis of native arteries of extremities with rest pain, right leg: Secondary | ICD-10-CM | POA: Insufficient documentation

## 2023-03-04 DIAGNOSIS — I252 Old myocardial infarction: Secondary | ICD-10-CM | POA: Insufficient documentation

## 2023-03-04 DIAGNOSIS — M5136 Other intervertebral disc degeneration, lumbar region: Secondary | ICD-10-CM | POA: Insufficient documentation

## 2023-03-04 DIAGNOSIS — J439 Emphysema, unspecified: Secondary | ICD-10-CM | POA: Diagnosis not present

## 2023-03-04 DIAGNOSIS — I700 Atherosclerosis of aorta: Secondary | ICD-10-CM | POA: Diagnosis not present

## 2023-03-04 DIAGNOSIS — Z01812 Encounter for preprocedural laboratory examination: Secondary | ICD-10-CM | POA: Insufficient documentation

## 2023-03-04 DIAGNOSIS — E785 Hyperlipidemia, unspecified: Secondary | ICD-10-CM | POA: Insufficient documentation

## 2023-03-04 DIAGNOSIS — I2510 Atherosclerotic heart disease of native coronary artery without angina pectoris: Secondary | ICD-10-CM | POA: Diagnosis not present

## 2023-03-04 DIAGNOSIS — F17200 Nicotine dependence, unspecified, uncomplicated: Secondary | ICD-10-CM | POA: Insufficient documentation

## 2023-03-04 DIAGNOSIS — Z951 Presence of aortocoronary bypass graft: Secondary | ICD-10-CM | POA: Diagnosis not present

## 2023-03-04 DIAGNOSIS — E1151 Type 2 diabetes mellitus with diabetic peripheral angiopathy without gangrene: Secondary | ICD-10-CM | POA: Insufficient documentation

## 2023-03-04 DIAGNOSIS — R911 Solitary pulmonary nodule: Secondary | ICD-10-CM | POA: Diagnosis not present

## 2023-03-04 DIAGNOSIS — Z01818 Encounter for other preprocedural examination: Secondary | ICD-10-CM

## 2023-03-04 DIAGNOSIS — E119 Type 2 diabetes mellitus without complications: Secondary | ICD-10-CM

## 2023-03-04 LAB — URINALYSIS, ROUTINE W REFLEX MICROSCOPIC
Bacteria, UA: NONE SEEN
Bilirubin Urine: NEGATIVE
Glucose, UA: NEGATIVE mg/dL
Hgb urine dipstick: NEGATIVE
Ketones, ur: NEGATIVE mg/dL
Leukocytes,Ua: NEGATIVE
Nitrite: NEGATIVE
Protein, ur: 100 mg/dL — AB
Specific Gravity, Urine: 1.015 (ref 1.005–1.030)
pH: 6 (ref 5.0–8.0)

## 2023-03-04 LAB — COMPREHENSIVE METABOLIC PANEL
ALT: 19 U/L (ref 0–44)
AST: 29 U/L (ref 15–41)
Albumin: 3.6 g/dL (ref 3.5–5.0)
Alkaline Phosphatase: 83 U/L (ref 38–126)
Anion gap: 13 (ref 5–15)
BUN: 17 mg/dL (ref 8–23)
CO2: 28 mmol/L (ref 22–32)
Calcium: 9 mg/dL (ref 8.9–10.3)
Chloride: 96 mmol/L — ABNORMAL LOW (ref 98–111)
Creatinine, Ser: 0.97 mg/dL (ref 0.61–1.24)
GFR, Estimated: >60 mL/min (ref >60)
Glucose, Bld: 126 mg/dL — ABNORMAL HIGH (ref 70–99)
Potassium: 4.6 mmol/L (ref 3.5–5.1)
Sodium: 137 mmol/L (ref 135–145)
Total Bilirubin: 0.7 mg/dL (ref 0.3–1.2)
Total Protein: 7.2 g/dL (ref 6.5–8.1)

## 2023-03-04 LAB — CBC
HCT: 43.4 % (ref 39.0–52.0)
Hemoglobin: 15 g/dL (ref 13.0–17.0)
MCH: 34.6 pg — ABNORMAL HIGH (ref 26.0–34.0)
MCHC: 34.6 g/dL (ref 30.0–36.0)
MCV: 100.2 fL — ABNORMAL HIGH (ref 80.0–100.0)
Platelets: 138 10*3/uL — ABNORMAL LOW (ref 150–400)
RBC: 4.33 MIL/uL (ref 4.22–5.81)
RDW: 13.7 % (ref 11.5–15.5)
WBC: 6.6 10*3/uL (ref 4.0–10.5)
nRBC: 0 % (ref 0.0–0.2)

## 2023-03-04 LAB — GLUCOSE, CAPILLARY: Glucose-Capillary: 128 mg/dL — ABNORMAL HIGH (ref 70–99)

## 2023-03-04 LAB — PROTIME-INR
INR: 0.9 (ref 0.8–1.2)
Prothrombin Time: 12.6 seconds (ref 11.4–15.2)

## 2023-03-04 LAB — SURGICAL PCR SCREEN
MRSA, PCR: NEGATIVE
Staphylococcus aureus: NEGATIVE

## 2023-03-04 LAB — TYPE AND SCREEN

## 2023-03-04 LAB — APTT: aPTT: 26 seconds (ref 24–36)

## 2023-03-04 NOTE — Progress Notes (Signed)
PCP - Abbey Chatters Cardiologist - Dietrich Pates  PPM/ICD - denies Device Orders - n/a Rep Notified - n/a  Chest x-ray - denies EKG - 12/09/2022 Stress Test - denies ECHO - 02/17/2020 Cardiac Cath - 02/16/2020  Sleep Study - denies CPAP - n/a  DM Type II; pt states he does not take any meds for his diabetes.  Fasting Blood Sugar: pt states he does not have glucometer at home. CBG 128 at PAT visit. Encourage pt to call his PCP about his DM II to see if he can get a prescription for a glucometer.   Last dose of GLP1 agonist-  n/a GLP1 instructions: n/a  Blood Thinner Instructions: n/a Aspirin Instructions: follow your surgeon's instructions on when to stop Aspirin. Per cardiologist's note aspirin can be held 5-7 days prior to surgery.  ERAS Protcol -no; NPO PRE-SURGERY Ensure or G2- n/a  COVID TEST- n/a   Anesthesia review: yes   Pt's BP 141/109 and 147/102 at PAT visit. Pt denies taking any BP meds. Encourage pt to call his cardiologist/PCP about his high BP. Tresa Endo , PA is aware. Encourage pt to decrease his alcohol intake and quit smoking.    Patient denies shortness of breath, fever, cough and chest pain at PAT appointment and the last 2 months.  All instructions explained to the patient, with a verbal understanding of the material. Patient agrees to go over the instructions while at home for a better understanding. Patient also instructed to self quarantine after being tested for COVID-19. The opportunity to ask questions was provided.

## 2023-03-04 NOTE — Pre-Procedure Instructions (Signed)
Surgical Instructions                 Your procedure is scheduled on Tuesday, July 9th.              Report to [facility] Main Entrance "A" at 5:30 A.M., then check in with the Admitting office.             Call this number if you have problems the morning of surgery:             [phone]    If you have any questions prior to your surgery date call [phone]: Open Monday-Friday 8am-4pm If you experience any cold or flu symptoms such as cough, fever, chills, shortness of breath, etc. between now and your scheduled surgery, please notify us at the above number                  Remember:             Do not eat or drink after midnight the night before your surgery                            Take these medicines the morning of surgery with A SIP OF WATER:  Lipitor      Follow your surgeon's instructions on when to stop Aspirin.  If no instructions were given by your surgeon then you will need to call the office to get those instructions.    As of today, STOP taking any Aleve, Naproxen, Ibuprofen, Motrin, Advil, Goody's, BC's, all herbal medications, fish oil, and all vitamins.         HOW TO MANAGE YOUR DIABETES BEFORE AND AFTER SURGERY   Why is it important to control my blood sugar before and after surgery? Improving blood sugar levels before and after surgery helps healing and can limit problems. A way of improving blood sugar control is eating a healthy diet by:  Eating less sugar and carbohydrates  Increasing activity/exercise  Talking with your doctor about reaching your blood sugar goals High blood sugars (greater than 180 mg/dL) can raise your risk of infections and slow your recovery, so you will need to focus on controlling your diabetes during the weeks before surgery. Make sure that the doctor who takes care of your diabetes knows about your planned surgery including the date and location.   How do I manage my blood sugar before surgery? Check your blood sugar  at least 4 times a day, starting 2 days before surgery, to make sure that the level is not too high or low.   Check your blood sugar the morning of your surgery when you wake up and every 2 hours until you get to the Short Stay unit.   If your blood sugar is less than 70 mg/dL, you will need to treat for low blood sugar: Do not take insulin. Treat a low blood sugar (less than 70 mg/dL) with  cup of clear juice (cranberry or apple), 4 glucose tablets, OR glucose gel. Recheck blood sugar in 15 minutes after treatment (to make sure it is greater than 70 mg/dL). If your blood sugar is not greater than 70 mg/dL on recheck, call [phone] for further instructions. Report your blood sugar to the short stay nurse when you get to Short Stay.   If you are admitted to the hospital after surgery: Your blood sugar will be checked by the staff and you will  probably be given insulin after surgery (instead of oral diabetes medicines) to make sure you have good blood sugar levels. The goal for blood sugar control after surgery is 80-180 mg/dL.         DAY OF SURGERY    Do not wear jewelry  Do not wear lotions, powders, cologne or deodorant. Do not shave 48 hours prior to surgery.  Men may shave face and neck. Do not bring valuables to the hospital. Do not wear nail polish     [facility] is not responsible for any belongings or valuables.     Do NOT Smoke (Tobacco/Vaping)  24 hours prior to your procedure   If you use a CPAP at night, you may bring your mask for your overnight stay.   Contacts, glasses, hearing aids, dentures or partials may not be worn into surgery, please bring cases for these belongings   For patients admitted to the hospital, discharge time will be determined by your treatment team.   Patients discharged the day of surgery will not be allowed to drive home, and someone needs to stay with them for 24 hours.     SURGICAL WAITING ROOM VISITATION Patients having surgery or a  procedure may have no more than 2 support people in the waiting area - these visitors may rotate.   Children under the age of 18 must have an adult with them who is not the patient. If the patient needs to stay at the hospital during part of their recovery, the visitor guidelines for inpatient rooms apply. Pre-op nurse will coordinate an appropriate time for 1 support person to accompany patient in pre-op.  This support person may not rotate.    Please refer to [email or web address] for the visitor guidelines for Inpatients (after your surgery is over and you are in a regular room).      Special instructions:     Oral Hygiene is also important to reduce your risk of infection.  Remember - BRUSH YOUR TEETH THE MORNING OF SURGERY WITH YOUR REGULAR TOOTHPASTE     [place]- Preparing For Surgery   Before surgery, you can play an important role. Because skin is not sterile, your skin needs to be as free of germs as possible. You can reduce the number of germs on your skin by washing with CHG (chlorahexidine gluconate) Soap before surgery.  CHG is an antiseptic cleaner which kills germs and bonds with the skin to continue killing germs even after washing.       Please do not use if you have an allergy to CHG or antibacterial soaps. If your skin becomes reddened/irritated stop using the CHG.  Do not shave (including legs and underarms) for at least 48 hours prior to first CHG shower. It is OK to shave your face.   Please follow these instructions carefully.  Shower the NIGHT BEFORE SURGERY and the MORNING OF SURGERY with CHG Soap.  If you chose to wash your hair, wash your hair first as usual with your normal shampoo. After you shampoo, rinse your hair and body thoroughly to remove the shampoo.  Then [employer] and genitals  (private parts) with your normal soap and rinse thoroughly to remove soap.   After that Use CHG Soap as you would any other liquid soap. You can apply CHG directly to the skin and wash gently with a scrungie or a clean washcloth.    Apply the CHG Soap to your body ONLY FROM THE NECK DOWN.  Do not use on open wounds or open sores. Avoid contact with your eyes, ears, mouth and genitals (private parts). Wash Face and genitals (private parts)  with your normal soap.    Wash thoroughly, paying special attention to the area where your surgery will be performed.   Thoroughly rinse your body with warm water from the neck down.   DO NOT shower/wash with your normal soap after using and rinsing off the CHG Soap.   Pat yourself dry with a CLEAN TOWEL.   Wear CLEAN PAJAMAS to bed the night before surgery   Place CLEAN SHEETS on your bed the night before your surgery   DO NOT SLEEP WITH PETS.     Day of Surgery:   Take a shower with CHG soap. Wear Clean/Comfortable clothing the morning of surgery Do not apply any deodorants/lotions.   Remember to brush your teeth WITH YOUR REGULAR TOOTHPASTE.       If you received a COVID test during your pre-op visit, it is requested that you wear a mask when out in public, stay away from anyone that may not be feeling well, and notify your surgeon if you develop symptoms. If you have been in contact with anyone that has tested positive in the last 10 days, please notify your surgeon.     Please read over the following fact sheets that you were given.

## 2023-03-05 LAB — HEMOGLOBIN A1C
Hgb A1c MFr Bld: 6.2 % — ABNORMAL HIGH (ref 4.8–5.6)
Mean Plasma Glucose: 131 mg/dL

## 2023-03-05 NOTE — Progress Notes (Signed)
Anesthesia Chart Review:  Case: [id number] Date/Time: 03/17/23 0715   Procedure: AORTOBIFEMORAL BYPASS GRAFT   Anesthesia type: General   Pre-op diagnosis: Critical limb ischemia of right lower extremity with rest pain   [facility] RO[facility] / [facility]   Surgeons: Maeola Harman, MD       DISCUSSION: Patient is a 62 year old male scheduled for the above procedure.  History includes smoking, CAD (NSTEMI, s/p CABG: LIMA-LAD, SVG-RAMUS, SVG-PDA 02/17/20), HLD, DM2, PAD, left subclavian artery stenosis, ETOH abuse (2-5 drinks/week is documented currently).   He is followed by cardiologist Dr. Tenny Craw for CAD, last visit 12/09/22. He was off all meds. She recommended he resume ASA and statin. HTN felt overall controlled at that time. One year follow-up planned.  Preoperative cardiology input outlined on 02/05/23 by Edd Fabian, NP, "Given past medical history and time since last visit, based on ACC/AHA guidelines, Karl Garcia would be at acceptable risk for the planned procedure without further cardiovascular testing.    His RCRI is a class III risk, 6.6% risk of major cardiac event.   His aspirin may be held for 5 to 7 days prior to his surgery.  Please resume as soon as hemostasis is achieved." He was advised to follow surgeon instructions regarding ASA.   He is not on antihypertensive medication, but historically has been considered controlled--112/76 on 12/09/22, 118/75 on 12/29/22, 120/84 on 02/04/23. BP higher at 03/04/23 PAT at 141/109, 147/102. He was advised to monitor BP and follow-up with PCP or cardiology if remains elevated. Smoking cessation also encouraged. He does have left subclavain artery stenosis by 2021 [place]. Message sent to Dr. Randie Heinz regarding elevated BP readings.   Anesthesia team to evaluate on the day of surgery.     VS: BP (!) 141/109   Pulse 95   Temp 36.9 C   Resp 18   Ht 6' (1.829 m)   Wt 81.2 kg   SpO2 97%   BMI 24.28 kg/m  BP 141/109, recheck  147/102. BP Readings from Last 3 Encounters:  03/04/23 (!) 141/109  02/04/23 120/84  12/29/22 118/74     PROVIDERS: Sharon Seller, NP is PCP  Dietrich Pates, MD is cardiologist Nanetta Batty, MD is [facility] cardiologist   LABS: Labs reviewed: Acceptable for surgery.  (all labs ordered are listed, but only abnormal results are displayed)  Labs Reviewed  CBC - Abnormal; Notable for the following components:      Result Value   MCV 100.2 (*)    MCH 34.6 (*)    Platelets 138 (*)    All other components within normal limits  COMPREHENSIVE METABOLIC PANEL - Abnormal; Notable for the following components:   Chloride 96 (*)    Glucose, Bld 126 (*)    All other components within normal limits  URINALYSIS, ROUTINE W REFLEX MICROSCOPIC - Abnormal; Notable for the following components:   Protein, ur 100 (*)    All other components within normal limits  HEMOGLOBIN A1C - Abnormal; Notable for the following components:   Hgb A1c MFr Bld 6.2 (*)    All other components within normal limits  GLUCOSE, CAPILLARY - Abnormal; Notable for the following components:   Glucose-Capillary 128 (*)    All other components within normal limits  SURGICAL PCR SCREEN  PROTIME-INR  APTT  TYPE AND SCREEN    IMAGES: CTA Ao+BiFem 01/28/23: IMPRESSION: VASCULAR 1. 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 2. 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 3. Bulky calcified atherosclerotic plaque in the right common femoral artery results in high-grade stenosis which extends into the proximal superficial femoral artery. 4. Bulky calcified atherosclerotic plaque results in high-grade stenosis throughout the left common iliac artery. The left external iliac artery remains patent and without significant stenosis. 5. 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 6.  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 7. Aortic and coronary artery atherosclerotic vascular calcifications. Aortic Atherosclerosis (ICD10-I70.0).   NON-VASCULAR 1. Patchy irregular enhancement throughout the prostate gland. Prostate carcinoma is a possibility. Recommend correlation with serum PSA. 2. Renal size discrepancy with the right kidney smaller than the left likely due to chronic ischemia from underlying renal artery stenosis. 3. Probable centrilobular pulmonary emphysema. 4. Lumbar degenerative disc disease.    CT Chest 01/06/23: IMPRESSION: 1. 5 mm right middle lobe perifissural nodule is new in the interval. Although likely benign, if the patient is high-risk, a non-contrast chest CT can be considered in 12 months.This recommendation follows the consensus statement: Guidelines for Management of Incidental Pulmonary Nodules Detected on CT Images: From the Fleischner Society 2017; Radiology 2017; 284:228-243. 2. Aortic Atherosclerosis (ICD10-I70.0) and Emphysema (ICD10-J43.9). 3. Note: Clinical data for this study indicates "Lung-RADS Score 4B - Very Suspicious". If the patient had previous lung cancer screening chest CT with a very suspicious lesion, direct comparison of this exam to that prior study is recommended for most definitive characterization.   EKG: 12/09/2022: Normal sinus rhythm.  Possible left atrial enlargement.   CV: TEE (Intr-op CABG) 02/17/20: PRE-OP FINDINGS   Left Ventricle: The LV cavity was at the upper limits of normal in size  and measured 5.4 cm at end-diastole at the mid-papillary level.. There was  hypokinesis of the apex and distal anterior septum. The ejection fraction  was calculated at 50-55% by 2D  Simpson's method in the 4 and 2 chamber views. There was normal LV wall  thickness.  - On the post-bypass exam, the LV systolic function appeared unchanged from the pre-bypass study.    Echo 02/16/20: IMPRESSIONS   1.  Left ventricular ejection fraction, by estimation, is 55 to 60%. The  left ventricle has normal function. The left ventricle has no regional  wall motion abnormalities. There is mild concentric left ventricular  hypertrophy. Left ventricular diastolic  parameters are consistent with Grade II diastolic dysfunction  (pseudonormalization).   2. Right ventricular systolic function is normal. The right ventricular  size is normal.   3. Left atrial size was moderately dilated.   4. The mitral valve is normal in structure. Trivial mitral valve  regurgitation. No evidence of mitral stenosis.   5. The aortic valve is tricuspid. Aortic valve regurgitation is not  visualized. No aortic stenosis is present.   6. The inferior vena cava is dilated in size with >50% respiratory  variability, suggesting right atrial pressure of 8 mmHg.    US Carotid 02/16/20: Summary:  - Right Carotid: Velocities in the right ICA are consistent with a 1-39%  stenosis.  - Left Carotid: Velocities in the left ICA are consistent with a 1-39%  stenosis.  - Vertebrals: Right vertebral artery demonstrates antegrade flow. Left  vertebral artery demonstrates retrograde flow.  - Subclavians: Left subclavian artery was monophasic; this along with  retrograde vertebral artery flow, and brachial artery pressure  difference of is suggestive of proximal obstruction. Normal flow  hemodynamics were seen in  the right subclavian artery.    Last cardiac cath 02/16/20 pre-CABG.   Past Medical History:  Diagnosis Date   Coronary artery disease    S/p NSTEMI 02/2020 >> s/p CABG // Echocardiogram 6/21: EF 55-60, Gr 2 DD, LAE   Diabetes mellitus Type 2    Essential hypertension    ETOH abuse    Hx of NSTEMI in 02/2020    Hyperlipidemia    L subclavian stenosis    Peripheral Arterial Disease    Tobacco abuse     Past Surgical History:  Procedure Laterality Date   ABDOMINAL AORTOGRAM W/LOWER EXTREMITY Bilateral 07/16/2020    Procedure: ABDOMINAL AORTOGRAM W/LOWER EXTREMITY;  Surgeon: Runell Gess, MD;  [facility] INVASIVE [facility];  Service: Cardiovascular;  Laterality: Bilateral;   CORONARY ARTERY BYPASS GRAFT N/A 02/17/2020   Procedure: CORONARY ARTERY BYPASS GRAFTING (CABG) x Three, using left internal mammry artery and right leg greater saphenous vein harvested endoscopically;  Surgeon: Kerin Perna, MD;  Location: [facility] OR;  Service: Open Heart Surgery;  Laterality: N/A;   LEFT HEART CATH AND CORONARY ANGIOGRAPHY N/A 02/16/2020   Procedure: LEFT HEART CATH AND CORONARY ANGIOGRAPHY;  Surgeon: Runell Gess, MD;  [facility] INVAS[facility] CV LAB;  Service: Cardiovascular;  Laterality: N/A;   TEE WITHOUT CARDIOVERSION N/A 02/17/2020   Procedure: TRANSESOPHAGEAL ECHOCARDIOGRAM (TEE);  Surgeon: Donata Clay, Theron Arista, MD;  Location: [facility] OR;  Service: Open Heart Surgery;  Laterality: N/A;   TONSILECTOMY, ADENOIDECTOMY, BILATERAL MYRINGOTOMY AND TUBES     unsure of all of that but + tonsilectomy    MEDICATIONS:  aspirin EC 81 MG tablet   atorvastatin (LIPITOR) 20 MG tablet   No current facility-administered medications for this encounter.    Shonna Chock, PA-C Surgical Short Stay/Anesthesiology [facility] Phone [phone] [facility] Phone [phone] 03/05/2023 9:29 PM

## 2023-03-05 NOTE — Anesthesia Preprocedure Evaluation (Addendum)
Anesthesia Evaluation    Reviewed: Allergy & Precautions, Patient's Chart, lab work & pertinent test results  History of Anesthesia Complications Negative for: history of anesthetic complications  Airway Mallampati: II  TM Distance: >3 FB Neck ROM: Full    Dental  (+) Edentulous Lower, Edentulous Upper   Pulmonary Current SmokerPatient did not abstain from smoking.   Pulmonary exam normal        Cardiovascular hypertension (no meds currently), (-) angina + CAD, + Past MI, + CABG and + Peripheral Vascular Disease  Normal cardiovascular exam     Neuro/Psych negative neurological ROS  negative psych ROS   GI/Hepatic negative GI ROS,,,(+)     substance abuse  alcohol use  Endo/Other  diabetes    Renal/GU negative Renal ROS     Musculoskeletal negative musculoskeletal ROS (+)    Abdominal   Peds  Hematology negative hematology ROS (+)   Anesthesia Other Findings   Reproductive/Obstetrics                              Anesthesia Physical Anesthesia Plan  ASA: 3  Anesthesia Plan: General   Post-op Pain Management: Tylenol PO (pre-op)*   Induction: Intravenous  PONV Risk Score and Plan: 2 and Treatment may vary due to age or medical condition, Ondansetron, Dexamethasone and Midazolam  Airway Management Planned: Oral ETT  Additional Equipment: Arterial line, CVP and Ultrasound Guidance Line Placement  Intra-op Plan:   Post-operative Plan: Extubation in OR  Informed Consent: I have reviewed the patients History and Physical, chart, labs and discussed the procedure including the risks, benefits and alternatives for the proposed anesthesia with the patient or authorized representative who has indicated his/her understanding and acceptance.     Dental advisory given  Plan Discussed with: CRNA and Anesthesiologist  Anesthesia Plan Comments: (Central line in room. Need updated H&P  prior to placement)        Anesthesia Quick Evaluation

## 2023-03-06 ENCOUNTER — Ambulatory Visit (INDEPENDENT_AMBULATORY_CARE_PROVIDER_SITE_OTHER): Payer: 59 | Admitting: Nurse Practitioner

## 2023-03-06 ENCOUNTER — Encounter: Payer: Self-pay | Admitting: Nurse Practitioner

## 2023-03-06 VITALS — BP 122/84 | HR 91 | Temp 97.5°F | Ht 72.0 in | Wt 178.0 lb

## 2023-03-06 DIAGNOSIS — Z720 Tobacco use: Secondary | ICD-10-CM | POA: Diagnosis not present

## 2023-03-06 DIAGNOSIS — R972 Elevated prostate specific antigen [PSA]: Secondary | ICD-10-CM | POA: Diagnosis not present

## 2023-03-06 DIAGNOSIS — E1169 Type 2 diabetes mellitus with other specified complication: Secondary | ICD-10-CM

## 2023-03-06 DIAGNOSIS — R030 Elevated blood-pressure reading, without diagnosis of hypertension: Secondary | ICD-10-CM

## 2023-03-06 DIAGNOSIS — Z1211 Encounter for screening for malignant neoplasm of colon: Secondary | ICD-10-CM | POA: Diagnosis not present

## 2023-03-06 DIAGNOSIS — I739 Peripheral vascular disease, unspecified: Secondary | ICD-10-CM

## 2023-03-06 NOTE — Patient Instructions (Addendum)
To call  Gastroenterol[facility]/Endoscopy to set up colonoscopy for colorectal screening [phone] [place]. [place], [place]  Main [place]: [phone]

## 2023-03-06 NOTE — Progress Notes (Signed)
Careteam: Patient Care Team: Sharon Seller, NP as PCP - General (Geriatric Medicine) Pricilla Riffle, MD as PCP - Cardiology (Cardiology) Bridgett Larsson, LCSW as Social Worker (Licensed Clinical Social Worker)  PLACE OF SERVICE:  [facility]  Advanced Directive information Does Patient Have a Medical Advance Directive?: No, Would patient like information on creating a medical advance directive?: No - Patient declined  Allergies  Allergen Reactions   Morphine And Codeine Rash    Chief Complaint  Patient presents with   Medical Management of Chronic Issues    3 month follow-up. Discuss need for shingrix, colonoscopy, hep c screening, and covid boosters. FYI patient will have aorta replacement, skin graft, and address calcium buildup, Dr.Caine, Timothy ([facility] [address] Patient c/o urinary frequency x 6 months, patient states "When I have to go, I gotta go." . Discuss elevated B/P at surgical clearance.      HPI: Patient is a 62 y.o. male for routine follow up.   He is scheduled for aortobifemoral bypass graft on 7/9  Has not scheduled colonoscopy but referral has been placed   He has had worsening urinary frequency for 6 months.  He had elevated PSA 2 months ago but did not follow up with urologist.  He states he never heard about the referral.  No blood in urine.  Denies weak stream just often.   Went in for presurgical testing and blood pressure was elevated.  Does not check blood pressure at home  Better today  Review of Systems:  Review of Systems  Constitutional:  Negative for chills, fever and weight loss.  HENT:  Negative for tinnitus.   Respiratory:  Negative for cough, sputum production and shortness of breath.   Cardiovascular:  Negative for chest pain, palpitations and leg swelling.  Gastrointestinal:  Negative for abdominal pain, constipation, diarrhea and heartburn.  Genitourinary:  Positive for frequency. Negative for dysuria and  urgency.  Musculoskeletal:  Negative for back pain, falls, joint pain and myalgias.  Skin: Negative.   Neurological:  Negative for dizziness and headaches.  Psychiatric/Behavioral:  Negative for depression and memory loss. The patient does not have insomnia.    Past Medical History:  Diagnosis Date   Coronary artery disease    S/p NSTEMI 02/2020 >> s/p CABG // Echocardiogram 6/21: EF 55-60, Gr 2 DD, LAE   Diabetes mellitus Type 2    Essential hypertension    ETOH abuse    Hx of NSTEMI in 02/2020    Hyperlipidemia    L subclavian stenosis    Peripheral Arterial Disease    Tobacco abuse    Past Surgical History:  Procedure Laterality Date   ABDOMINAL AORTOGRAM W/LOWER EXTREMITY Bilateral 07/16/2020   Procedure: ABDOMINAL AORTOGRAM W/LOWER EXTREMITY;  Surgeon: Runell Gess, MD;  Location: [facility];  Service: Cardiovascular;  Laterality: Bilateral;   CORONARY ARTERY BYPASS GRAFT N/A 02/17/2020   Procedure: CORONARY ARTERY BYPASS GRAFTING (CABG) x Three, using left internal mammry artery and right leg greater saphenous vein harvested endoscopically;  Surgeon: Kerin Perna, MD;  Location: [facility] OR;  Service: Open Heart Surgery;  Laterality: N/A;   LEFT HEART CATH AND CORONARY ANGIOGRAPHY N/A 02/16/2020   Procedure: LEFT HEART CATH AND CORONARY ANGIOGRAPHY;  Surgeon: Runell Gess, MD;  Location: [facility];  Service: Cardiovascular;  Laterality: N/A;   TEE WITHOUT CARDIOVERSION N/A 02/17/2020   Procedure: TRANSESOPHAGEAL ECHOCARDIOGRAM (TEE);  Surgeon: Kerin Perna, MD;  Location:  [facility] OR;  Service: Open Heart Surgery;  Laterality: N/A;   TONSILECTOMY, ADENOIDECTOMY, BILATERAL MYRINGOTOMY AND TUBES     unsure of all of that but + tonsilectomy   Social History:   reports that he has been smoking cigarettes. He has a 50.00 pack-year smoking history. He has never used smokeless tobacco. He reports that he does not currently use alcohol after a past usage of about 2.0  standard drinks of alcohol per week. He reports that he does not use drugs.  Family History  Problem Relation Age of Onset   Heart attack Mother    Heart disease Father    Heart attack Father    Heart failure Father    Aneurysm Father    Healthy Brother    Heart disease Paternal Uncle     Medications: Patient's Medications  New Prescriptions   No medications on file  Previous Medications   ASPIRIN EC 81 MG TABLET    Take 1 tablet (81 mg total) by mouth daily. Swallow whole.   ATORVASTATIN (LIPITOR) 20 MG TABLET    Take 1 tablet (20 mg total) by mouth daily.  Modified Medications   No medications on file  Discontinued Medications   No medications on file    Physical Exam:  Vitals:   03/06/23 0849  BP: 122/84  Pulse: 91  Temp: (!) 97.5 F (36.4 C)  TempSrc: Temporal  SpO2: 95%  Weight: 178 lb (80.7 kg)  Height: 6' (1.829 m)   Body mass index is 24.14 kg/m. Wt Readings from Last 3 Encounters:  03/06/23 178 lb (80.7 kg)  03/04/23 179 lb (81.2 kg)  02/04/23 178 lb (80.7 kg)    Physical Exam Constitutional:      General: He is not in acute distress.    Appearance: He is well-developed. He is not diaphoretic.  HENT:     Head: Normocephalic and atraumatic.     Right Ear: External ear normal.     Left Ear: External ear normal.     Mouth/Throat:     Pharynx: No oropharyngeal exudate.  Eyes:     Conjunctiva/sclera: Conjunctivae normal.     Pupils: Pupils are equal, round, and reactive to light.  Cardiovascular:     Rate and Rhythm: Normal rate and regular rhythm.     Heart sounds: Normal heart sounds.  Pulmonary:     Effort: Pulmonary effort is normal.     Breath sounds: Normal breath sounds.  Abdominal:     General: Bowel sounds are normal.     Palpations: Abdomen is soft.  Musculoskeletal:        General: No tenderness.     Cervical back: Normal range of motion and neck supple.     Right lower leg: Edema present.     Left lower leg: Edema present.   Skin:    General: Skin is warm and dry.  Neurological:     Mental Status: He is alert and oriented to person, place, and time.     Labs reviewed: Basic Metabolic Panel: Recent Labs    12/03/22 1055 01/28/23 0834 03/04/23 0830  NA 140  --  137  K 4.0  --  4.6  CL 104  --  96*  CO2 30  --  28  GLUCOSE 105*  --  126*  BUN 7  --  17  CREATININE 0.82 1.10 0.97  CALCIUM 8.8  --  9.0   Liver Function Tests: Recent Labs    12/03/22 1055 03/04/23  0830  AST 21 29  ALT 15 19  ALKPHOS  --  83  BILITOT 0.5 0.7  PROT 6.6 7.2  ALBUMIN  --  3.6   No results for input(s): "LIPASE", "AMYLASE" in the last 8760 hours. No results for input(s): "AMMONIA" in the last 8760 hours. CBC: Recent Labs    12/03/22 1055 03/04/23 0830  WBC 5.4 6.6  NEUTROABS 3,629  --   HGB 14.5 15.0  HCT 41.7 43.4  MCV 99.5 100.2*  PLT 139* 138*   Lipid Panel: Recent Labs    12/03/22 1055  CHOL 183  HDL 51  LDLCALC 109*  TRIG 118  CHOLHDL 3.6   TSH: No results for input(s): "TSH" in the last 8760 hours. A1C: Lab Results  Component Value Date   HGBA1C 6.2 (H) 03/04/2023     Assessment/Plan 1. Elevated PSA -PSA elevated to 73.71 -educated about need to follow up with urologist due to elevated PSA and urinary frequency  - Ambulatory referral to Urology  2. Colon cancer screening -number provided to call to schedule colonoscopy  3. Type 2 diabetes mellitus with other specified complication, without long-term current use of insulin (HCC) -Encouraged dietary compliance, routine foot care/monitoring and to keep up with diabetic eye exams through ophthalmology   4. Elevated blood pressure reading -noted on pre-op exam. Dietary modifications given   5. Peripheral arterial disease (HCC) -aortobifemoral bypass graft scheduled for 03/17/23. Continues on ASA and followed by vascular.   6. Tobacco abuse -cessation encouraged, pt reports he has a date and plans to stop.    Return in about  3 months (around 06/06/2023) for routine follow up .  Janene Harvey. Biagio Borg [facility] [phone]

## 2023-03-11 ENCOUNTER — Ambulatory Visit (INDEPENDENT_AMBULATORY_CARE_PROVIDER_SITE_OTHER): Payer: 59 | Admitting: Urology

## 2023-03-11 VITALS — BP 117/84 | HR 81 | Ht 71.0 in | Wt 178.0 lb

## 2023-03-11 DIAGNOSIS — R972 Elevated prostate specific antigen [PSA]: Secondary | ICD-10-CM

## 2023-03-11 DIAGNOSIS — R399 Unspecified symptoms and signs involving the genitourinary system: Secondary | ICD-10-CM

## 2023-03-11 DIAGNOSIS — N402 Nodular prostate without lower urinary tract symptoms: Secondary | ICD-10-CM

## 2023-03-11 LAB — URINALYSIS, ROUTINE W REFLEX MICROSCOPIC
Bilirubin, UA: NEGATIVE
Glucose, UA: NEGATIVE
Ketones, UA: NEGATIVE
Leukocytes,UA: NEGATIVE
Nitrite, UA: NEGATIVE
RBC, UA: NEGATIVE
Specific Gravity, UA: 1.01 (ref 1.005–1.030)
Urobilinogen, Ur: 0.2 mg/dL (ref 0.2–1.0)
pH, UA: 6 (ref 5.0–7.5)

## 2023-03-11 LAB — MICROSCOPIC EXAMINATION
Bacteria, UA: NONE SEEN
Cast Type: NONE SEEN
Casts: NONE SEEN /lpf
Crystal Type: NONE SEEN
Crystals: NONE SEEN
Mucus, UA: NONE SEEN
Renal Epithel, UA: NONE SEEN /hpf
Trichomonas, UA: NONE SEEN
Yeast, UA: NONE SEEN

## 2023-03-11 MED ORDER — TAMSULOSIN HCL 0.4 MG PO CAPS
0.4000 mg | ORAL_CAPSULE | Freq: Every day | ORAL | 3 refills | Status: DC
Start: 2023-03-11 — End: 2023-09-14

## 2023-03-11 NOTE — Progress Notes (Signed)
Assessment: 1. Elevated PSA   2. Nodular prostate   3. Lower urinary tract symptoms (LUTS)      Plan: Today I had a long discussion with the patient regarding his elevated PSA and abnormal DRE as well as his lower urinary tract symptoms. Patient will begin new medical therapy for LUTS-Rx: Tamsulosin 0.4 mg daily  Discussed option for further evaluation of elevated PSA and DRE including prostate ultrasound and biopsy.  Patient agrees to proceed.  Nature procedure discussed in detail including potential adverse events and complications.  Patient is scheduled for vascular surgery next week so we will wait 4 weeks to 5 weeks following his surgery for biopsy.  Repeat PSA today   Chief Complaint: elevated psa  History of Present Illness:  Karl Garcia is a 62 y.o. male with a past medical history of significant generalized vascular disease including coronary artery disease with history of STEMI and also peripheral vascular disease-patient scheduled for aortobifem bypass later this month.  In addition he has type 2 diabetes and a long history of tobacco and ethanol abuse.  He is seen in consultation from Sharon Seller, NP for evaluation of elevated psa. Patient had a PSA drawn April 2024 which was 93.  No other psa testing known. At that time he was referred to urology but did not follow-up.  ED- mild IPSS =14 Family history of prostate cancer-none  DRE-- abnl with right side nodule   Past Medical History:  Past Medical History:  Diagnosis Date   Coronary artery disease    S/p NSTEMI 02/2020 >> s/p CABG // Echocardiogram 6/21: EF 55-60, Gr 2 DD, LAE   Diabetes mellitus Type 2    Essential hypertension    ETOH abuse    Hx of NSTEMI in 02/2020    Hyperlipidemia    L subclavian stenosis    Peripheral Arterial Disease    Tobacco abuse     Past Surgical History:  Past Surgical History:  Procedure Laterality Date   ABDOMINAL AORTOGRAM W/LOWER EXTREMITY Bilateral  07/16/2020   Procedure: ABDOMINAL AORTOGRAM W/LOWER EXTREMITY;  Surgeon: Runell Gess, MD;  Location: [facility];  Service: Cardiovascular;  Laterality: Bilateral;   CORONARY ARTERY BYPASS GRAFT N/A 02/17/2020   Procedure: CORONARY ARTERY BYPASS GRAFTING (CABG) x Three, using left internal mammry artery and right leg greater saphenous vein harvested endoscopically;  Surgeon: Kerin Perna, MD;  Location: [facility] OR;  Service: Open Heart Surgery;  Laterality: N/A;   LEFT HEART CATH AND CORONARY ANGIOGRAPHY N/A 02/16/2020   Procedure: LEFT HEART CATH AND CORONARY ANGIOGRAPHY;  Surgeon: Runell Gess, MD;  Location: [facility];  Service: Cardiovascular;  Laterality: N/A;   TEE WITHOUT CARDIOVERSION N/A 02/17/2020   Procedure: TRANSESOPHAGEAL ECHOCARDIOGRAM (TEE);  Surgeon: Donata Clay, Theron Arista, MD;  Location: [facility] OR;  Service: Open Heart Surgery;  Laterality: N/A;   TONSILECTOMY, ADENOIDECTOMY, BILATERAL MYRINGOTOMY AND TUBES     unsure of all of that but + tonsilectomy    Allergies:  Allergies  Allergen Reactions   Morphine And Codeine Rash    Family History:  Family History  Problem Relation Age of Onset   Heart attack Mother    Heart disease Father    Heart attack Father    Heart failure Father    Aneurysm Father    Healthy Brother    Heart disease Paternal Uncle     Social History:  Social History   Tobacco Use   Smoking status:  Every Day    Packs/day: 1.00    Years: 50.00    Additional pack years: 0.00    Total pack years: 50.00    Types: Cigarettes   Smokeless tobacco: Never   Tobacco comments:    Discussed with patient he is interested but not committed at this time  Vaping Use   Vaping Use: Never used  Substance Use Topics   Alcohol use: Not Currently    Alcohol/week: 2.0 standard drinks of alcohol    Types: 2 Cans of beer per week    Comment: 5 drinks weekly   Drug use: Never    Review of symptoms:  Constitutional:  Negative for unexplained  weight loss, night sweats, fever, chills ENT:  Negative for nose bleeds, sinus pain, painful swallowing CV:  Negative for chest pain, shortness of breath, exercise intolerance, palpitations, loss of consciousness Resp:  Negative for cough, wheezing, shortness of breath GI:  Negative for nausea, vomiting, diarrhea, bloody stools GU:  Positives noted in HPI; otherwise negative for gross hematuria, dysuria, urinary incontinence Neuro:  Negative for seizures, poor balance, limb weakness, slurred speech Psych:  Negative for lack of energy, depression, anxiety Endocrine:  Negative for polydipsia, polyuria, symptoms of hypoglycemia (dizziness, hunger, sweating) Hematologic:  Negative for anemia, purpura, petechia, prolonged or excessive bleeding, use of anticoagulants  Allergic:  Negative for difficulty breathing or choking as a result of exposure to anything; no shellfish allergy; no allergic response (rash/itch) to materials, foods  Physical exam: BP 117/84   Pulse 81   Ht 5\' 11"  (1.803 m)   Wt 178 lb (80.7 kg)   BMI 24.83 kg/m  GENERAL APPEARANCE:  Well appearing, well developed, well nourished, NAD  GU: Normal external genitalia DRE: Normal sphincter tone; prostate is approximately 40 g with right-sided irregular areas and nodularity-no definite evidence of extracapsular extension   Results: UA neg

## 2023-03-12 LAB — PSA: Prostate Specific Ag, Serum: 134 ng/mL — ABNORMAL HIGH (ref 0.0–4.0)

## 2023-03-16 ENCOUNTER — Encounter (HOSPITAL_COMMUNITY): Payer: Self-pay | Admitting: Vascular Surgery

## 2023-03-17 ENCOUNTER — Inpatient Hospital Stay (HOSPITAL_COMMUNITY): Payer: 59 | Admitting: Vascular Surgery

## 2023-03-17 ENCOUNTER — Inpatient Hospital Stay (HOSPITAL_COMMUNITY): Payer: 59 | Admitting: Certified Registered Nurse Anesthetist

## 2023-03-17 ENCOUNTER — Encounter (HOSPITAL_COMMUNITY)
Admission: RE | Disposition: A | Discharge status: Home Health | Payer: Self-pay | Source: Home / Self Care | Attending: Vascular Surgery

## 2023-03-17 ENCOUNTER — Inpatient Hospital Stay (HOSPITAL_COMMUNITY): Payer: 59

## 2023-03-17 ENCOUNTER — Inpatient Hospital Stay (HOSPITAL_COMMUNITY)
Admission: RE | Admit: 2023-03-17 | Discharge: 2023-03-23 | DRG: 269 | Disposition: A | Discharge status: Home Health | Payer: 59 | Attending: Vascular Surgery | Admitting: Vascular Surgery

## 2023-03-17 ENCOUNTER — Encounter (HOSPITAL_COMMUNITY): Payer: Self-pay | Admitting: Vascular Surgery

## 2023-03-17 ENCOUNTER — Other Ambulatory Visit: Payer: Self-pay

## 2023-03-17 DIAGNOSIS — R972 Elevated prostate specific antigen [PSA]: Secondary | ICD-10-CM | POA: Diagnosis present

## 2023-03-17 DIAGNOSIS — N5089 Other specified disorders of the male genital organs: Secondary | ICD-10-CM | POA: Diagnosis not present

## 2023-03-17 DIAGNOSIS — I252 Old myocardial infarction: Secondary | ICD-10-CM

## 2023-03-17 DIAGNOSIS — I708 Atherosclerosis of other arteries: Secondary | ICD-10-CM | POA: Diagnosis not present

## 2023-03-17 DIAGNOSIS — I739 Peripheral vascular disease, unspecified: Principal | ICD-10-CM | POA: Diagnosis present

## 2023-03-17 DIAGNOSIS — Z452 Encounter for adjustment and management of vascular access device: Secondary | ICD-10-CM | POA: Diagnosis not present

## 2023-03-17 DIAGNOSIS — Z8249 Family history of ischemic heart disease and other diseases of the circulatory system: Secondary | ICD-10-CM

## 2023-03-17 DIAGNOSIS — I700 Atherosclerosis of aorta: Secondary | ICD-10-CM | POA: Diagnosis present

## 2023-03-17 DIAGNOSIS — I2510 Atherosclerotic heart disease of native coronary artery without angina pectoris: Secondary | ICD-10-CM | POA: Diagnosis present

## 2023-03-17 DIAGNOSIS — Z7982 Long term (current) use of aspirin: Secondary | ICD-10-CM | POA: Diagnosis not present

## 2023-03-17 DIAGNOSIS — I10 Essential (primary) hypertension: Secondary | ICD-10-CM

## 2023-03-17 DIAGNOSIS — E785 Hyperlipidemia, unspecified: Secondary | ICD-10-CM | POA: Diagnosis not present

## 2023-03-17 DIAGNOSIS — E1151 Type 2 diabetes mellitus with diabetic peripheral angiopathy without gangrene: Secondary | ICD-10-CM | POA: Diagnosis not present

## 2023-03-17 DIAGNOSIS — D62 Acute posthemorrhagic anemia: Secondary | ICD-10-CM | POA: Diagnosis not present

## 2023-03-17 DIAGNOSIS — I7409 Other arterial embolism and thrombosis of abdominal aorta: Secondary | ICD-10-CM

## 2023-03-17 DIAGNOSIS — M7989 Other specified soft tissue disorders: Secondary | ICD-10-CM | POA: Diagnosis not present

## 2023-03-17 DIAGNOSIS — Z79899 Other long term (current) drug therapy: Secondary | ICD-10-CM

## 2023-03-17 DIAGNOSIS — K429 Umbilical hernia without obstruction or gangrene: Secondary | ICD-10-CM | POA: Diagnosis present

## 2023-03-17 DIAGNOSIS — E871 Hypo-osmolality and hyponatremia: Secondary | ICD-10-CM | POA: Diagnosis not present

## 2023-03-17 DIAGNOSIS — E119 Type 2 diabetes mellitus without complications: Secondary | ICD-10-CM

## 2023-03-17 DIAGNOSIS — I70213 Atherosclerosis of native arteries of extremities with intermittent claudication, bilateral legs: Secondary | ICD-10-CM | POA: Diagnosis not present

## 2023-03-17 DIAGNOSIS — F1010 Alcohol abuse, uncomplicated: Secondary | ICD-10-CM | POA: Diagnosis present

## 2023-03-17 DIAGNOSIS — Z885 Allergy status to narcotic agent status: Secondary | ICD-10-CM | POA: Diagnosis not present

## 2023-03-17 DIAGNOSIS — F17210 Nicotine dependence, cigarettes, uncomplicated: Secondary | ICD-10-CM | POA: Diagnosis present

## 2023-03-17 DIAGNOSIS — R262 Difficulty in walking, not elsewhere classified: Secondary | ICD-10-CM | POA: Diagnosis not present

## 2023-03-17 DIAGNOSIS — R21 Rash and other nonspecific skin eruption: Secondary | ICD-10-CM | POA: Diagnosis not present

## 2023-03-17 DIAGNOSIS — Z951 Presence of aortocoronary bypass graft: Secondary | ICD-10-CM | POA: Diagnosis not present

## 2023-03-17 DIAGNOSIS — Z48812 Encounter for surgical aftercare following surgery on the circulatory system: Secondary | ICD-10-CM | POA: Diagnosis not present

## 2023-03-17 DIAGNOSIS — Z4682 Encounter for fitting and adjustment of non-vascular catheter: Secondary | ICD-10-CM | POA: Diagnosis not present

## 2023-03-17 DIAGNOSIS — R918 Other nonspecific abnormal finding of lung field: Secondary | ICD-10-CM | POA: Diagnosis not present

## 2023-03-17 HISTORY — PX: AORTA - BILATERAL FEMORAL ARTERY BYPASS GRAFT: SHX1175

## 2023-03-17 LAB — CBC
HCT: 34.8 % — ABNORMAL LOW (ref 39.0–52.0)
Hemoglobin: 11.6 g/dL — ABNORMAL LOW (ref 13.0–17.0)
MCH: 34.1 pg — ABNORMAL HIGH (ref 26.0–34.0)
MCHC: 33.3 g/dL (ref 30.0–36.0)
MCV: 102.4 fL — ABNORMAL HIGH (ref 80.0–100.0)
Platelets: 116 10*3/uL — ABNORMAL LOW (ref 150–400)
RBC: 3.4 MIL/uL — ABNORMAL LOW (ref 4.22–5.81)
RDW: 14.3 % (ref 11.5–15.5)
WBC: 10.9 10*3/uL — ABNORMAL HIGH (ref 4.0–10.5)
nRBC: 0 % (ref 0.0–0.2)

## 2023-03-17 LAB — BPAM RBC
Blood Product Expiration Date: 202408072359
ISSUE DATE / TIME: 202407090747
ISSUE DATE / TIME: 202407090747
Unit Type and Rh: 5100
Unit Type and Rh: 5100
Unit Type and Rh: 5100

## 2023-03-17 LAB — BASIC METABOLIC PANEL
Anion gap: 15 (ref 5–15)
BUN: 9 mg/dL (ref 8–23)
CO2: 20 mmol/L — ABNORMAL LOW (ref 22–32)
Calcium: 7.5 mg/dL — ABNORMAL LOW (ref 8.9–10.3)
Chloride: 103 mmol/L (ref 98–111)
Creatinine, Ser: 0.93 mg/dL (ref 0.61–1.24)
GFR, Estimated: >60 mL/min (ref >60)
Glucose, Bld: 189 mg/dL — ABNORMAL HIGH (ref 70–99)
Potassium: 4.5 mmol/L (ref 3.5–5.1)
Sodium: 138 mmol/L (ref 135–145)

## 2023-03-17 LAB — BLOOD GAS, ARTERIAL
Acid-base deficit: 3.2 mmol/L — ABNORMAL HIGH (ref 0.0–2.0)
Bicarbonate: 22.7 mmol/L (ref 20.0–28.0)
O2 Saturation: 97.6 %
Patient temperature: 37.2
pCO2 arterial: 43 mmHg (ref 32–48)
pH, Arterial: 7.33 — ABNORMAL LOW (ref 7.35–7.45)
pO2, Arterial: 81 mmHg — ABNORMAL LOW (ref 83–108)

## 2023-03-17 LAB — POCT I-STAT 7, (LYTES, BLD GAS, ICA,H+H)
Acid-base deficit: 3 mmol/L — ABNORMAL HIGH (ref 0.0–2.0)
Acid-base deficit: 3 mmol/L — ABNORMAL HIGH (ref 0.0–2.0)
Acid-base deficit: 2 mmol/L (ref 0.0–2.0)
Acid-base deficit: 3 mmol/L — ABNORMAL HIGH (ref 0.0–2.0)
Bicarbonate: 24.4 mmol/L (ref 20.0–28.0)
Bicarbonate: 24.1 mmol/L (ref 20.0–28.0)
Bicarbonate: 24.4 mmol/L (ref 20.0–28.0)
Bicarbonate: 23.1 mmol/L (ref 20.0–28.0)
Calcium, Ion: 1.15 mmol/L (ref 1.15–1.40)
Calcium, Ion: 1.13 mmol/L — ABNORMAL LOW (ref 1.15–1.40)
Calcium, Ion: 1.08 mmol/L — ABNORMAL LOW (ref 1.15–1.40)
Calcium, Ion: 1.06 mmol/L — ABNORMAL LOW (ref 1.15–1.40)
HCT: 37 % — ABNORMAL LOW (ref 39.0–52.0)
HCT: 38 % — ABNORMAL LOW (ref 39.0–52.0)
HCT: 31 % — ABNORMAL LOW (ref 39.0–52.0)
HCT: 33 % — ABNORMAL LOW (ref 39.0–52.0)
Hemoglobin: 12.6 g/dL — ABNORMAL LOW (ref 13.0–17.0)
Hemoglobin: 12.9 g/dL — ABNORMAL LOW (ref 13.0–17.0)
Hemoglobin: 10.5 g/dL — ABNORMAL LOW (ref 13.0–17.0)
Hemoglobin: 11.2 g/dL — ABNORMAL LOW (ref 13.0–17.0)
O2 Saturation: 98 %
O2 Saturation: 96 %
O2 Saturation: 100 %
O2 Saturation: 100 %
Patient temperature: 35.9
Patient temperature: 36.8
Patient temperature: 37.2
Potassium: 3.7 mmol/L (ref 3.5–5.1)
Potassium: 4.3 mmol/L (ref 3.5–5.1)
Potassium: 4.6 mmol/L (ref 3.5–5.1)
Potassium: 4.6 mmol/L (ref 3.5–5.1)
Sodium: 138 mmol/L (ref 135–145)
Sodium: 138 mmol/L (ref 135–145)
Sodium: 137 mmol/L (ref 135–145)
Sodium: 136 mmol/L (ref 135–145)
TCO2: 26 mmol/L (ref 22–32)
TCO2: 26 mmol/L (ref 22–32)
TCO2: 26 mmol/L (ref 22–32)
TCO2: 25 mmol/L (ref 22–32)
pCO2 arterial: 50.9 mmHg — ABNORMAL HIGH (ref 32–48)
pCO2 arterial: 46 mmHg (ref 32–48)
pCO2 arterial: 49.8 mmHg — ABNORMAL HIGH (ref 32–48)
pCO2 arterial: 47.7 mmHg (ref 32–48)
pH, Arterial: 7.288 — ABNORMAL LOW (ref 7.35–7.45)
pH, Arterial: 7.322 — ABNORMAL LOW (ref 7.35–7.45)
pH, Arterial: 7.297 — ABNORMAL LOW (ref 7.35–7.45)
pH, Arterial: 7.295 — ABNORMAL LOW (ref 7.35–7.45)
pO2, Arterial: 119 mmHg — ABNORMAL HIGH (ref 83–108)
pO2, Arterial: 81 mmHg — ABNORMAL LOW (ref 83–108)
pO2, Arterial: 193 mmHg — ABNORMAL HIGH (ref 83–108)
pO2, Arterial: 211 mmHg — ABNORMAL HIGH (ref 83–108)

## 2023-03-17 LAB — POCT I-STAT, CHEM 8
BUN: 8 mg/dL (ref 8–23)
Calcium, Ion: 1.07 mmol/L — ABNORMAL LOW (ref 1.15–1.40)
Chloride: 104 mmol/L (ref 98–111)
Creatinine, Ser: 0.7 mg/dL (ref 0.61–1.24)
Glucose, Bld: 165 mg/dL — ABNORMAL HIGH (ref 70–99)
HCT: 32 % — ABNORMAL LOW (ref 39.0–52.0)
Hemoglobin: 10.9 g/dL — ABNORMAL LOW (ref 13.0–17.0)
Potassium: 4.6 mmol/L (ref 3.5–5.1)
Sodium: 137 mmol/L (ref 135–145)
TCO2: 26 mmol/L (ref 22–32)

## 2023-03-17 LAB — TYPE AND SCREEN
Antibody Screen: NEGATIVE
Unit division: 0
Unit division: 0

## 2023-03-17 LAB — GLUCOSE, CAPILLARY
Glucose-Capillary: 118 mg/dL — ABNORMAL HIGH (ref 70–99)
Glucose-Capillary: 197 mg/dL — ABNORMAL HIGH (ref 70–99)
Glucose-Capillary: 194 mg/dL — ABNORMAL HIGH (ref 70–99)
Glucose-Capillary: 191 mg/dL — ABNORMAL HIGH (ref 70–99)
Glucose-Capillary: 172 mg/dL — ABNORMAL HIGH (ref 70–99)
Glucose-Capillary: 132 mg/dL — ABNORMAL HIGH (ref 70–99)
Glucose-Capillary: 99 mg/dL (ref 70–99)
Glucose-Capillary: 129 mg/dL — ABNORMAL HIGH (ref 70–99)

## 2023-03-17 LAB — POCT ACTIVATED CLOTTING TIME
Activated Clotting Time: 207 seconds
Activated Clotting Time: 262 seconds
Activated Clotting Time: 305 seconds
Activated Clotting Time: 220 seconds
Activated Clotting Time: 226 seconds

## 2023-03-17 LAB — PROTIME-INR
INR: 1.2 (ref 0.8–1.2)
Prothrombin Time: 15.1 seconds (ref 11.4–15.2)

## 2023-03-17 LAB — MAGNESIUM: Magnesium: 1.3 mg/dL — ABNORMAL LOW (ref 1.7–2.4)

## 2023-03-17 LAB — APTT: aPTT: 27 seconds (ref 24–36)

## 2023-03-17 LAB — PREPARE RBC (CROSSMATCH)

## 2023-03-17 SURGERY — CREATION, BYPASS, ARTERIAL, AORTA TO FEMORAL, BILATERAL, USING GRAFT
Anesthesia: General | Site: Abdomen

## 2023-03-17 MED ORDER — ROCURONIUM BROMIDE 10 MG/ML (PF) SYRINGE
PREFILLED_SYRINGE | INTRAVENOUS | Status: AC
  Filled 2023-03-17: qty 10

## 2023-03-17 MED ORDER — HYDRALAZINE HCL 20 MG/ML IJ SOLN: 5.0000 mg | INTRAMUSCULAR | Status: DC | PRN

## 2023-03-17 MED ORDER — METOPROLOL TARTRATE 5 MG/5ML IV SOLN: 2.0000 mg | INTRAVENOUS | Status: DC | PRN

## 2023-03-17 MED ORDER — FENTANYL CITRATE (PF) 100 MCG/2ML IJ SOLN: 25.0000 ug | INTRAMUSCULAR | Status: DC | PRN

## 2023-03-17 MED ORDER — DEXTROSE 50 % IV SOLN: 0.0000 mL | INTRAVENOUS | Status: DC | PRN

## 2023-03-17 MED ORDER — ALBUMIN HUMAN 5 % IV SOLN
INTRAVENOUS | Status: AC
  Filled 2023-03-17: qty 250

## 2023-03-17 MED ORDER — NALOXONE HCL 0.4 MG/ML IJ SOLN: 0.4000 mg | INTRAMUSCULAR | Status: DC | PRN

## 2023-03-17 MED ORDER — CHLORHEXIDINE GLUCONATE 0.12 % MT SOLN: 15.0000 mL | Freq: Once | OROMUCOSAL | Status: AC

## 2023-03-17 MED ORDER — ACETAMINOPHEN 500 MG PO TABS
ORAL_TABLET | ORAL | Status: AC
  Administered 2023-03-17: 1000 mg via ORAL
  Filled 2023-03-17: qty 2

## 2023-03-17 MED ORDER — LABETALOL HCL 5 MG/ML IV SOLN
10.0000 mg | INTRAVENOUS | Status: DC | PRN
  Administered 2023-03-19: 10 mg via INTRAVENOUS
  Filled 2023-03-17 (×2): qty 4

## 2023-03-17 MED ORDER — ALBUMIN HUMAN 5 % IV SOLN
12.5000 g | Freq: Once | INTRAVENOUS | Status: AC
  Administered 2023-03-17: 12.5 g via INTRAVENOUS

## 2023-03-17 MED ORDER — CHLORHEXIDINE GLUCONATE 0.12 % MT SOLN
OROMUCOSAL | Status: AC
  Administered 2023-03-17: 15 mL via OROMUCOSAL
  Filled 2023-03-17: qty 15

## 2023-03-17 MED ORDER — LIDOCAINE 2% (20 MG/ML) 5 ML SYRINGE
INTRAMUSCULAR | Status: DC | PRN
  Administered 2023-03-17: 60 mg via INTRAVENOUS

## 2023-03-17 MED ORDER — OXYCODONE HCL 5 MG PO TABS: 5.0000 mg | ORAL_TABLET | Freq: Once | ORAL | Status: DC | PRN

## 2023-03-17 MED ORDER — CHLORHEXIDINE GLUCONATE CLOTH 2 % EX PADS
6.0000 | MEDICATED_PAD | Freq: Every day | CUTANEOUS | Status: DC
  Administered 2023-03-17 – 2023-03-22 (×6): 6 via TOPICAL

## 2023-03-17 MED ORDER — CEFAZOLIN SODIUM-DEXTROSE 2-4 GM/100ML-% IV SOLN
2.0000 g | INTRAVENOUS | Status: AC
  Administered 2023-03-17 (×2): 2 g via INTRAVENOUS

## 2023-03-17 MED ORDER — SODIUM CHLORIDE 0.9 % IV SOLN: INTRAVENOUS | Status: DC

## 2023-03-17 MED ORDER — CHLORHEXIDINE GLUCONATE CLOTH 2 % EX PADS: 6.0000 | MEDICATED_PAD | Freq: Once | CUTANEOUS | Status: DC

## 2023-03-17 MED ORDER — METHOCARBAMOL 1000 MG/10ML IJ SOLN: 500.0000 mg | Freq: Four times a day (QID) | INTRAVENOUS | Status: DC | PRN

## 2023-03-17 MED ORDER — ONDANSETRON HCL 4 MG/2ML IJ SOLN
INTRAMUSCULAR | Status: DC | PRN
  Administered 2023-03-17: 4 mg via INTRAVENOUS

## 2023-03-17 MED ORDER — MIDAZOLAM HCL 2 MG/2ML IJ SOLN
INTRAMUSCULAR | Status: DC | PRN
  Administered 2023-03-17: 2 mg via INTRAVENOUS

## 2023-03-17 MED ORDER — CEFAZOLIN SODIUM 1 G IJ SOLR
INTRAMUSCULAR | Status: AC
  Filled 2023-03-17: qty 20

## 2023-03-17 MED ORDER — ONDANSETRON HCL 4 MG/2ML IJ SOLN: 4.0000 mg | Freq: Once | INTRAMUSCULAR | Status: DC | PRN

## 2023-03-17 MED ORDER — PHENYLEPHRINE HCL-NACL 20-0.9 MG/250ML-% IV SOLN
INTRAVENOUS | Status: DC | PRN
  Administered 2023-03-17: 25 ug/min via INTRAVENOUS

## 2023-03-17 MED ORDER — PHENOL 1.4 % MT LIQD: 1.0000 | OROMUCOSAL | Status: DC | PRN

## 2023-03-17 MED ORDER — DEXAMETHASONE SODIUM PHOSPHATE 10 MG/ML IJ SOLN
INTRAMUSCULAR | Status: AC
  Filled 2023-03-17: qty 1

## 2023-03-17 MED ORDER — ONDANSETRON HCL 4 MG/2ML IJ SOLN
INTRAMUSCULAR | Status: AC
  Filled 2023-03-17: qty 2

## 2023-03-17 MED ORDER — PROPOFOL 10 MG/ML IV BOLUS
INTRAVENOUS | Status: AC
  Filled 2023-03-17: qty 20

## 2023-03-17 MED ORDER — CEFAZOLIN SODIUM-DEXTROSE 2-4 GM/100ML-% IV SOLN
INTRAVENOUS | Status: AC
  Filled 2023-03-17: qty 100

## 2023-03-17 MED ORDER — SUGAMMADEX SODIUM 200 MG/2ML IV SOLN
INTRAVENOUS | Status: DC | PRN
  Administered 2023-03-17: 200 mg via INTRAVENOUS

## 2023-03-17 MED ORDER — SODIUM CHLORIDE 0.9% FLUSH: 9.0000 mL | INTRAVENOUS | Status: DC | PRN

## 2023-03-17 MED ORDER — ESMOLOL HCL 100 MG/10ML IV SOLN
INTRAVENOUS | Status: DC | PRN
  Administered 2023-03-17: 30 mg via INTRAVENOUS

## 2023-03-17 MED ORDER — ALUM & MAG HYDROXIDE-SIMETH 200-200-20 MG/5ML PO SUSP: 15.0000 mL | ORAL | Status: DC | PRN

## 2023-03-17 MED ORDER — HEPARIN 6000 UNIT IRRIGATION SOLUTION
Status: DC | PRN
  Administered 2023-03-17: 1

## 2023-03-17 MED ORDER — MIDAZOLAM HCL 2 MG/2ML IJ SOLN
INTRAMUSCULAR | Status: AC
  Filled 2023-03-17: qty 2

## 2023-03-17 MED ORDER — PHENYLEPHRINE 80 MCG/ML (10ML) SYRINGE FOR IV PUSH (FOR BLOOD PRESSURE SUPPORT)
PREFILLED_SYRINGE | INTRAVENOUS | Status: DC | PRN
  Administered 2023-03-17 (×6): 80 ug via INTRAVENOUS

## 2023-03-17 MED ORDER — LACTATED RINGERS IV SOLN: INTRAVENOUS | Status: DC | PRN

## 2023-03-17 MED ORDER — SODIUM CHLORIDE 0.9 % IV SOLN: 500.0000 mL | Freq: Once | INTRAVENOUS | Status: DC | PRN

## 2023-03-17 MED ORDER — DIPHENHYDRAMINE HCL 50 MG/ML IJ SOLN: 12.5000 mg | Freq: Four times a day (QID) | INTRAMUSCULAR | Status: DC | PRN

## 2023-03-17 MED ORDER — ONDANSETRON HCL 4 MG/2ML IJ SOLN: 4.0000 mg | Freq: Four times a day (QID) | INTRAMUSCULAR | Status: DC | PRN

## 2023-03-17 MED ORDER — POTASSIUM CHLORIDE 20 MEQ PO PACK
20.0000 meq | PACK | Freq: Once | ORAL | Status: AC | PRN
  Administered 2023-03-21: 20 meq
  Filled 2023-03-17: qty 1

## 2023-03-17 MED ORDER — ESMOLOL HCL 100 MG/10ML IV SOLN
INTRAVENOUS | Status: AC
  Filled 2023-03-17: qty 10

## 2023-03-17 MED ORDER — SODIUM CHLORIDE 0.9 % IV SOLN: INTRAVENOUS | Status: DC | PRN

## 2023-03-17 MED ORDER — 0.9 % SODIUM CHLORIDE (POUR BTL) OPTIME
TOPICAL | Status: DC | PRN
  Administered 2023-03-17: 3000 mL

## 2023-03-17 MED ORDER — CEFAZOLIN SODIUM-DEXTROSE 2-4 GM/100ML-% IV SOLN
2.0000 g | Freq: Three times a day (TID) | INTRAVENOUS | Status: AC
  Administered 2023-03-17 (×2): 2 g via INTRAVENOUS
  Filled 2023-03-17 (×2): qty 100

## 2023-03-17 MED ORDER — ORAL CARE MOUTH RINSE: 15.0000 mL | Freq: Once | OROMUCOSAL | Status: AC

## 2023-03-17 MED ORDER — HEPARIN SODIUM (PORCINE) 1000 UNIT/ML IJ SOLN
INTRAMUSCULAR | Status: AC
  Filled 2023-03-17: qty 10

## 2023-03-17 MED ORDER — DIPHENHYDRAMINE HCL 12.5 MG/5ML PO ELIX: 12.5000 mg | ORAL_SOLUTION | Freq: Four times a day (QID) | ORAL | Status: DC | PRN

## 2023-03-17 MED ORDER — PANTOPRAZOLE SODIUM 40 MG IV SOLR
40.0000 mg | Freq: Every day | INTRAVENOUS | Status: DC
  Administered 2023-03-17 – 2023-03-19 (×3): 40 mg via INTRAVENOUS
  Filled 2023-03-17 (×3): qty 10

## 2023-03-17 MED ORDER — PROTAMINE SULFATE 10 MG/ML IV SOLN
INTRAVENOUS | Status: DC | PRN
  Administered 2023-03-17: 20 mg via INTRAVENOUS
  Administered 2023-03-17: 10 mg via INTRAVENOUS
  Administered 2023-03-17: 20 mg via INTRAVENOUS

## 2023-03-17 MED ORDER — INSULIN REGULAR(HUMAN) IN NACL 100-0.9 UT/100ML-% IV SOLN
INTRAVENOUS | Status: DC
  Administered 2023-03-17: 7 [IU]/h via INTRAVENOUS
  Filled 2023-03-17: qty 100

## 2023-03-17 MED ORDER — ROCURONIUM BROMIDE 10 MG/ML (PF) SYRINGE
PREFILLED_SYRINGE | INTRAVENOUS | Status: DC | PRN
  Administered 2023-03-17: 30 mg via INTRAVENOUS
  Administered 2023-03-17: 20 mg via INTRAVENOUS
  Administered 2023-03-17: 70 mg via INTRAVENOUS
  Administered 2023-03-17 (×2): 50 mg via INTRAVENOUS

## 2023-03-17 MED ORDER — HEMOSTATIC AGENTS (NO CHARGE) OPTIME
TOPICAL | Status: DC | PRN
  Administered 2023-03-17 (×3): 1 via TOPICAL

## 2023-03-17 MED ORDER — BISACODYL 10 MG RE SUPP: 10.0000 mg | Freq: Every day | RECTAL | Status: DC | PRN

## 2023-03-17 MED ORDER — ALBUMIN HUMAN 5 % IV SOLN: INTRAVENOUS | Status: DC | PRN

## 2023-03-17 MED ORDER — FENTANYL CITRATE (PF) 250 MCG/5ML IJ SOLN
INTRAMUSCULAR | Status: AC
  Filled 2023-03-17: qty 5

## 2023-03-17 MED ORDER — HEPARIN 6000 UNIT IRRIGATION SOLUTION
Status: AC
  Filled 2023-03-17: qty 500

## 2023-03-17 MED ORDER — ORAL CARE MOUTH RINSE: 15.0000 mL | OROMUCOSAL | Status: DC | PRN

## 2023-03-17 MED ORDER — MAGNESIUM SULFATE 2 GM/50ML IV SOLN
2.0000 g | Freq: Once | INTRAVENOUS | Status: AC
  Administered 2023-03-17: 2 g via INTRAVENOUS
  Filled 2023-03-17: qty 50

## 2023-03-17 MED ORDER — LIDOCAINE 2% (20 MG/ML) 5 ML SYRINGE
INTRAMUSCULAR | Status: AC
  Filled 2023-03-17: qty 5

## 2023-03-17 MED ORDER — PROPOFOL 10 MG/ML IV BOLUS
INTRAVENOUS | Status: DC | PRN
  Administered 2023-03-17: 50 mg via INTRAVENOUS
  Administered 2023-03-17: 90 mg via INTRAVENOUS

## 2023-03-17 MED ORDER — DEXAMETHASONE SODIUM PHOSPHATE 10 MG/ML IJ SOLN
INTRAMUSCULAR | Status: DC | PRN
  Administered 2023-03-17: 10 mg via INTRAVENOUS

## 2023-03-17 MED ORDER — ACETAMINOPHEN 650 MG RE SUPP: 325.0000 mg | RECTAL | Status: DC | PRN

## 2023-03-17 MED ORDER — HYDROMORPHONE 1 MG/ML IV SOLN
INTRAVENOUS | Status: DC
  Administered 2023-03-17: 2.5 mg via INTRAVENOUS
  Administered 2023-03-17 – 2023-03-19 (×4): 30 mg via INTRAVENOUS
  Administered 2023-03-19: 1.8 mg via INTRAVENOUS
  Administered 2023-03-20: 2.7 mg via INTRAVENOUS
  Administered 2023-03-20: 2.4 mg via INTRAVENOUS
  Filled 2023-03-17 (×3): qty 30

## 2023-03-17 MED ORDER — PHENYLEPHRINE 80 MCG/ML (10ML) SYRINGE FOR IV PUSH (FOR BLOOD PRESSURE SUPPORT)
PREFILLED_SYRINGE | INTRAVENOUS | Status: AC
  Filled 2023-03-17: qty 10

## 2023-03-17 MED ORDER — MAGNESIUM SULFATE 2 GM/50ML IV SOLN: 2.0000 g | Freq: Once | INTRAVENOUS | Status: DC | PRN

## 2023-03-17 MED ORDER — ACETAMINOPHEN 500 MG PO TABS: 1000.0000 mg | ORAL_TABLET | Freq: Once | ORAL | Status: AC

## 2023-03-17 MED ORDER — LACTATED RINGERS IV SOLN: INTRAVENOUS | Status: DC

## 2023-03-17 MED ORDER — SODIUM CHLORIDE 0.9% IV SOLUTION: Freq: Once | INTRAVENOUS | Status: DC

## 2023-03-17 MED ORDER — HEPARIN SODIUM (PORCINE) 1000 UNIT/ML IJ SOLN
INTRAMUSCULAR | Status: DC | PRN
  Administered 2023-03-17: 3000 [IU] via INTRAVENOUS
  Administered 2023-03-17: 5000 [IU] via INTRAVENOUS
  Administered 2023-03-17: 8000 [IU] via INTRAVENOUS

## 2023-03-17 MED ORDER — ACETAMINOPHEN 325 MG PO TABS: 325.0000 mg | ORAL_TABLET | ORAL | Status: DC | PRN

## 2023-03-17 MED ORDER — FENTANYL CITRATE (PF) 250 MCG/5ML IJ SOLN
INTRAMUSCULAR | Status: DC | PRN
  Administered 2023-03-17 (×8): 50 ug via INTRAVENOUS

## 2023-03-17 MED ORDER — OXYCODONE HCL 5 MG/5ML PO SOLN: 5.0000 mg | Freq: Once | ORAL | Status: DC | PRN

## 2023-03-17 SURGICAL SUPPLY — 57 items
ADH SKN CLS APL DERMABOND .7 (GAUZE/BANDAGES/DRESSINGS) ×5
BAG COUNTER SPONGE SURGICOUNT (BAG) ×1 IMPLANT
BAG SPNG CNTER NS LX DISP (BAG) ×1
CANISTER SUCT 3000ML PPV (MISCELLANEOUS) ×1 IMPLANT
CATH EMB 4FR 80 (CATHETERS) IMPLANT
CLIP LIGATING EXTRA MED SLVR (CLIP) ×1 IMPLANT
CLIP LIGATING EXTRA SM BLUE (MISCELLANEOUS) ×1 IMPLANT
CLIP TI LARGE 6 (CLIP) IMPLANT
DERMABOND ADVANCED .7 DNX12 (GAUZE/BANDAGES/DRESSINGS) ×2 IMPLANT
ELECT BLADE 4.0 EZ CLEAN MEGAD (MISCELLANEOUS) ×1
ELECT BLADE 6.5 EXT (BLADE) IMPLANT
ELECT REM PT RETURN 9FT ADLT (ELECTROSURGICAL) ×1
ELECTRODE BLDE 4.0 EZ CLN MEGD (MISCELLANEOUS) ×1 IMPLANT
ELECTRODE REM PT RTRN 9FT ADLT (ELECTROSURGICAL) ×1 IMPLANT
FELT TEFLON 1X6 (MISCELLANEOUS) IMPLANT
GAUZE 4X4 16PLY ~~LOC~~+RFID DBL (SPONGE) IMPLANT
GLOVE BIO SURGEON STRL SZ7.5 (GLOVE) ×1 IMPLANT
GOWN STRL REUS W/ TWL LRG LVL3 (GOWN DISPOSABLE) ×2 IMPLANT
GOWN STRL REUS W/ TWL XL LVL3 (GOWN DISPOSABLE) ×1 IMPLANT
GOWN STRL REUS W/TWL LRG LVL3 (GOWN DISPOSABLE) ×2
GOWN STRL REUS W/TWL XL LVL3 (GOWN DISPOSABLE) ×1
GRAFT HEMASHIELD 14X7MM (Vascular Products) IMPLANT
INSERT FOGARTY 61MM (MISCELLANEOUS) IMPLANT
INSERT FOGARTY SM (MISCELLANEOUS) ×2 IMPLANT
KIT BASIN OR (CUSTOM PROCEDURE TRAY) ×1 IMPLANT
KIT TURNOVER KIT B (KITS) ×1 IMPLANT
NS IRRIG 1000ML POUR BTL (IV SOLUTION) ×2 IMPLANT
PACK AORTA (CUSTOM PROCEDURE TRAY) ×1 IMPLANT
PAD ARMBOARD 7.5X6 YLW CONV (MISCELLANEOUS) ×2 IMPLANT
PENCIL BUTTON HOLSTER BLD 10FT (ELECTRODE) IMPLANT
POWDER SURGICEL 3.0 GRAM (HEMOSTASIS) IMPLANT
SPONGE T-LAP 18X18 ~~LOC~~+RFID (SPONGE) IMPLANT
SUT MNCRL AB 4-0 PS2 18 (SUTURE) ×2 IMPLANT
SUT PDS AB 1 TP1 54 (SUTURE) ×2 IMPLANT
SUT PROLENE 3 0 SH 48 (SUTURE) ×1 IMPLANT
SUT PROLENE 4 0 RB 1 (SUTURE) ×2
SUT PROLENE 4-0 RB1 .5 CRCL 36 (SUTURE) IMPLANT
SUT PROLENE 5 0 C 1 24 (SUTURE) ×2 IMPLANT
SUT PROLENE 5 0 C 1 36 (SUTURE) IMPLANT
SUT PROLENE 6 0 BV (SUTURE) IMPLANT
SUT SILK 2 0 (SUTURE) ×1
SUT SILK 2 0 TIES 17X18 (SUTURE) ×1
SUT SILK 2 0SH CR/8 30 (SUTURE) ×1 IMPLANT
SUT SILK 2-0 18XBRD TIE 12 (SUTURE) ×1 IMPLANT
SUT SILK 2-0 18XBRD TIE BLK (SUTURE) ×1 IMPLANT
SUT SILK 3 0 (SUTURE) ×1
SUT SILK 3 0 TIES 17X18 (SUTURE) ×1
SUT SILK 3-0 18XBRD TIE 12 (SUTURE) ×1 IMPLANT
SUT SILK 3-0 18XBRD TIE BLK (SUTURE) ×1 IMPLANT
SUT VIC AB 2-0 CT1 27 (SUTURE) ×5
SUT VIC AB 2-0 CT1 TAPERPNT 27 (SUTURE) ×5 IMPLANT
SUT VIC AB 3-0 SH 27 (SUTURE) ×2
SUT VIC AB 3-0 SH 27X BRD (SUTURE) ×2 IMPLANT
TOWEL GREEN STERILE (TOWEL DISPOSABLE) ×1 IMPLANT
TOWEL ~~LOC~~+RFID 17X26 BLUE (SPONGE) ×1 IMPLANT
TRAY FOLEY MTR SLVR 16FR STAT (SET/KITS/TRAYS/PACK) ×1 IMPLANT
WATER STERILE IRR 1000ML POUR (IV SOLUTION) ×2 IMPLANT

## 2023-03-17 NOTE — Anesthesia Postprocedure Evaluation (Signed)
Anesthesia Post Note  Patient: Karl Garcia  Procedure(s) Performed: AORTOBIFEMORAL BYPASS GRAFT USING X HEMASHIELD GOLD VASCULAR GRAFT, LIGATION OF INTERNAL MESENTERIC ARTERY (Abdomen)     Patient location during evaluation: PACU Anesthesia Type: General Level of consciousness: awake and alert Pain management: pain level controlled Vital Signs Assessment: post-procedure vital signs reviewed and stable Respiratory status: spontaneous breathing, nonlabored ventilation, respiratory function stable and patient connected to nasal cannula oxygen Cardiovascular status: stable Postop Assessment: no apparent nausea or vomiting Anesthetic complications: no   No notable events documented.  Last Vitals:  Vitals:   03/17/23 1345 03/17/23 1400  BP: (!) 122/93 (!) 121/92  Pulse: 100 88  Resp: 16 15  Temp:  37.2 C  SpO2: 95% 94%    Last Pain:  Vitals:   03/17/23 1315  PainSc: Asleep                 Beryle Lathe

## 2023-03-17 NOTE — Op Note (Signed)
Patient name: Karl Garcia MRN: [id number] DOB: 1961-02-21 Sex: male  03/17/2023 Pre-operative Diagnosis: Aortoiliac occlusive disease with life-limiting claudication Post-operative diagnosis:  Same Surgeon:  Luanna Salk. Randie Heinz, MD Assistants: Victorino Sparrow, MD; Doreatha Massed, [place] Procedure Performed:  Aortobifemoral bypass grafting with 14 x 7 mm dacryon with aortic and bilateral common femoral endarterectomies and ligation of inferior mesenteric artery  Indications: 62 year old male with a history of short distance claudication and severe aortoiliac occlusive disease 62 year old male with a history of short distance claudication and severe aortoiliac occlusive disease.  His only option for definitive repair appears to be aortobifemoral bypass grafting with common femoral endarterectomies bilaterally.  We have discussed the risk benefits alternatives he demonstrates understanding and agrees to proceed.  Experience assistants were necessary to facilitate exposure of the bilateral common femoral arteries with extensive endarterectomy as well as exposure of the aorta and proximally endarterectomy with oversewing of the distal aorta which required pledgeted suturing.  Findings: The aorta itself was calcified throughout I was able to clamp and AP direction approximately 1 cm inferior to the left renal artery.  The IMA was a very large vessel but had very strong backbleeding at completion I elected not to reimplant this and it was ligated.  The distal aorta was heavily calcified and was injured with clamping after oversewing the distal aorta I did have to place multiple pledgeted sutures where the aorta had been injured from the clamp ultimately hemostasis was obtained.  Extensive bilateral common femoral endarterectomies including the profunda and the SFAs were performed and at completion there was brisk flow into both profundus and into the left SFA and there was brisk posterior tibial signals of  both feet.  There was flow retrograde into the left common iliac artery which could be palpated with hopeful filling of the left hypogastric artery.   Procedure:  The patient was identified in the holding area and taken to the operating room where is placed supine operative when general anesthesia was induced.  He was sterilely prepped and draped in the abdomen and bilateral groins in the usual fashion, antibiotics were administered and a timeout was called.  We began with a vertical incision in the left groin dissected down to the common femoral artery up under the inguinal ligament divided the crossing vein.  Multiple side branches were encircled with Vesseloops.  I dissected down the common femoral bifurcation which was heavily calcified in the SFA and profunda and encircled these with Vesseloops.  I began the tunnel tract from the left common femoral exposure site over the external iliac artery into the abdomen bluntly.  Then turned my attention to the right common femoral artery exposure and a vertical incision again was created we dissected down to the skin subcutaneous tissue to the common femoral artery itself and dissected under the inguinal ligament divided the crossing vein and began a tunnel tract bluntly.  This vessel was heavily heavily calcified we dissected down to the bifurcation which was also calcified and expose the SFA and profunda and encircled these with Vesseloops.  A midline laparotomy incision was then created and we opened down to the subcutaneous tissue open the midline fascia entered the abdomen at the level of an existing umbilical hernia and open the fascia and peritoneum for the entire to the incision.  The bowel was inspected noted to all of the normal and NG tube was confirmed in place.  Self-retaining Thompson retractor was then placed and all of the small bowel  was retracted to the patient's right lateral side.  Retroperitoneum was opened and the ligament of Treitz was taken  down.  We dissected down to the level of the aorta and identified the left renal crossing vein and the bilateral renal arteries.  The IMA was then preserved and we dissected down to the bifurcation of the aorta using cautery.  We then finished out tunnel tracks from the aortic bifurcation down to the bilateral common femoral arteries and we placed umbilical tapes to hold placement.  The patient was then fully heparinized.  He has an AP clamp to clamp the aorta in an AP direction approximate 1 cm below the left renal artery.  I then clamped the distal aorta with aortic DeBakey clamp and the IMA was also individually isolated and clamped.  I transected the proximal aorta where it was heavily calcified and extensive endarterectomy was performed.  Multiple lumbar arteries were then clipped and some were oversewn with 2-0 silk suture.  The IMA was then harvested with an Corel patch and clamped after endarterectomy was performed and there was noted to be very strong backbleeding from the IMA and was reclamped.  The distal aorta was then oversewn with running 3-0 Prolene suture.  After releasing the clamp there was noted to be a clamp injury due to the heavy calcification.  This was then oversewed with 4-0 Prolene with pledgeted suture.  After we did this there was no further bleeding distally.  After the endarterectomy was performed to the proximal aorta we then brought a 14 x 7 mm dacryon graft the table trimmed this to size with the bifurcation landing in an anatomic position and then sewed this into end with 3-0 Prolene suture.  Upon completion we did have 2 areas that were fixed with pledgeted suture but other than that there was good hemostasis.  The graft limbs were then tunneled through the tunnels that were previously created.  Attention was then first turned to the right groin the outflow was clamped mostly just the profunda.  Open the vessel longitudinally where it was heavily calcified and subtotally occluded.   Extensive endarterectomy was performed including up on the common femoral artery into the external iliac artery where we establish some inflow and this was reclamped.  Endarterectomy included down to the profunda and the proximal SFA where there was also backbleeding and all of these vessels were clamped and flushed with heparinized saline.  The graft was then trimmed to size and sewn in place into side with 5-0 Prolene suture.  Prior completion without flushing all directions.  Upon completion we released the clamps patient did tolerate this well from a hemodynamic standpoint.  There was bleeding from the distal extrailiac artery which was repaired with 5-0 Prolene suture.  There were strong signals in the profunda and a high resistance signal in the SFA.  We obtained hemostasis and then packed the wound and turned our attention to the left side.  Again we clamped the outflow followed by the inflow and opened the vessel longitudinally where again it was subtotally occluded.  Extensive endarterectomy was performed including the proximal SFA and very strong backbleeding was encountered I attempted to pass a 4 Fogarty but would not pass beyond 2 cm.  I then clamped the SFA.  Extensive endarterectomy of the inflow was then performed there was actually pretty strong inflow into the common femoral artery and this was clamped.  The graft was then trimmed to size and sewn in place into side  5 with 5-0 Prolene suture.  Prior to completion we again we allowed flushing all directions.  Upon completion there was very strong flow by Doppler into the profunda and the SFA.  We again obtain hemostasis in the wound and packed the wound.  We turned our attention back to the abdomen and we placed our self-retaining tractors.  All of the intestinal contents including the left colon small bowel and transverse colon appeared pink and viable.  There was pulsatile backbleeding from the IMA and thus I elected not to reimplant and this was  tied off with 2-0 silk suture.  50 mg of protamine was administered and we meticulously obtain hemostasis in the abdomen including in the retroperitoneum.  We then closed the retroperitoneum over the graft with a running 2-0 Vicryl suture.  The abdominal contents were all then examined again and noted to be healthy and returned to the abdomen.  Midline fascia was then closed with running #1 PDS tied on both ends and in the midline.  Skin was then closed with Monocryl.  Dermabond was placed at the skin level.  We turned our attention the groins and these were irrigated and hemostasis obtained and we closed in layers of Vicryl and Monocryl and again Dermabond was placed at the skin level.  The patient was then awakened from anesthesia having tolerated the procedure without any complication.  All counts were correct at completion.  EBL: 1800 cc  Transfusion: 500 cc Cell Saver.    Jayln Branscom C. Randie Heinz, MD [facility] Office: [phone] Pager: [phone]

## 2023-03-17 NOTE — Anesthesia Procedure Notes (Signed)
Procedure Name: Intubation Date/Time: 03/17/2023 7:36 AM  Performed by: Nils Pyle, CRNAPre-anesthesia Checklist: Patient identified, Emergency Drugs available, Suction available and Patient being monitored Patient Re-evaluated:Patient Re-evaluated prior to induction Oxygen Delivery Method: Circle System Utilized Preoxygenation: Pre-oxygenation with 100% oxygen Induction Type: IV induction Ventilation: Mask ventilation without difficulty and Oral airway inserted - appropriate to patient size Laryngoscope Size: Hyacinth Meeker and 2 Grade View: Grade I Tube type: Oral Tube size: 7.5 mm Number of attempts: 1 Airway Equipment and Method: Stylet and Oral airway Placement Confirmation: ETT inserted through vocal cords under direct vision, positive ETCO2 and breath sounds checked- equal and bilateral Secured at: 23 cm Tube secured with: Tape Dental Injury: Teeth and Oropharynx as per pre-operative assessment

## 2023-03-17 NOTE — Discharge Instructions (Signed)
 [facility]  Discharge Instructions   Open Aortic Surgery  Please refer to the following instructions for your post-procedure care. Your surgeon or Physician Assistant will discuss any changes with you.  Activity  Avoid lifting more than eight pounds (a gallon of milk) until after your first post-operative visit. You are encouraged to walk as much as you can. You can slowly return to normal activities but must avoid strenuous activity and heavy lifting until your doctor tells you it's okay. Heavy lifting can hurt the incision and cause a hernia. Avoid activities such as vacuuming or swinging a golf club. It is normal to feel tired for several weeks after your surgery. Do not drive until your doctor gives the okay and you are no longer taking prescription pain medications. It is also normal to have difficulty with sleep habits, eating and bowl movements after surgery. These will go away with time.  Bathing/Showering  Shower daily after you go home. Do not soak in a bathtub, hot tub, or swim until the incision heals.  Incision Care  Shower every day. Clean your incision with mild soap and water. Pat the area dry with a clean towel. You do not need a bandage unless otherwise instructed. Do not apply any ointments or creams to your incision. You may have skin glue on your incision. Do not peel it off. It will come off on its own in about one week. If you have staples or sutures along your incision, they will be removed at your post op appointment.  If you have groin incisions, wash the groin wounds with soap and water daily and pat dry. (No tub bath-only shower)  Then put a dry gauze or washcloth in the groin to keep this area dry to help prevent wound infection.  Do this daily and as needed.  Do not use Vaseline or neosporin on your incisions.  Only use soap and water on your incisions and then protect and keep dry.  Diet  Resume your normal diet. There are no  special food restriction following this procedure. A low fat/low cholesterol diet is recommended for all patients with vascular disease. After your aortic surgery, it's normal to feel full faster than usual and to not feel as hungry as you normally would. You will probably lose weight initially following your surgery. It's best to eat small, frequent meals over the course of the day. Call the office if you find that you are unable to eat even small meals.   In order to heal from your surgery, it is CRITICAL to get adequate nutrition. Your body requires vitamins, minerals, and protein. Vegetables are the best source of vitamins and minerals. If you have pain, you may take over-the-counter pain reliever such as acetaminophen (Tylenol). If you were prescribed a stronger pain medication, please be aware these medication can cause nausea and constipation. Prevent nausea by taking the medication with a snack or meal. Avoid constipation by drinking plenty of fluids and eating foods with a high amount of fiber, such as fruits, vegetables and grains. Take 100mg of the over-the-counter stool softener Colace twice a day as needed to help with constipation. A laxative, such as Milk of Magnesia, may be recommended for you at this time. Do not take a laxative unless your surgeon or P.A. tells you it's OK.  Do not take Tylenol if you are taking stronger pain medications (such as Percocet).  Follow Up  Our office will schedule a follow up   appointment 2-3 weeks after discharge.  Please call us immediately for any of the following conditions    .     Severe or worsening pain in your legs or feet or in your abdomen back or chest. Increased pain, redness drainage (pus) from your incision site. Increased abdominal pain, bloating, nausea, vomiting, or persistent diarrhea. Fever of 101 degrees or higher. Swelling in your leg (s).  Reduce your risk of vascular disease  Stop smoking. If you would like help, call  [facility] at 1-800-QUIT-NOW (1-800-784-8669) or [facility] at 336-586-4000. Manage your cholesterol Maintain a desired weight Control your diabetes Keep your blood pressure down  If you have any questions please call the office at [phone].   

## 2023-03-17 NOTE — Anesthesia Procedure Notes (Addendum)
Central Venous Catheter Insertion Performed by: Beryle Lathe, MD, anesthesiologist Start/End7/05/2023 7:38 AM, 03/17/2023 7:45 AM Patient location: Pre-op. Preanesthetic checklist: patient identified, IV checked, risks and benefits discussed, surgical consent, monitors and equipment checked, pre-op evaluation, timeout performed and anesthesia consent Position: Trendelenburg Lidocaine 1% used for infiltration and patient sedated Hand hygiene performed , maximum sterile barriers used  and Seldinger technique used Catheter size: 8 Fr Central line was placed.Double lumen Procedure performed using ultrasound guided technique. Ultrasound Notes:anatomy identified, needle tip was noted to be adjacent to the nerve/plexus identified, no ultrasound evidence of intravascular and/or intraneural injection and image(s) printed for medical record Attempts: 1 Following insertion, line sutured, dressing applied and Biopatch. Post procedure assessment: blood return through all ports, free fluid flow and no air  Patient tolerated the procedure well with no immediate complications.

## 2023-03-17 NOTE — Transfer of Care (Signed)
Immediate Anesthesia Transfer of Care Note  Patient: Karl Garcia  Procedure(s) Performed: AORTOBIFEMORAL BYPASS GRAFT USING X HEMASHIELD GOLD VASCULAR GRAFT, LIGATION OF INTERNAL MESENTERIC ARTERY (Abdomen)  Patient Location: PACU  Anesthesia Type:General  Level of Consciousness: awake, alert , and oriented  Airway & Oxygen Therapy: Patient Spontanous Breathing and Patient connected to nasal cannula oxygen  Post-op Assessment: Report given to RN, Post -op Vital signs reviewed and stable, and Patient moving all extremities X 4  Post vital signs: Reviewed and stable  Last Vitals:  Vitals Value Taken Time  BP 129/93 03/17/23 1251  Temp    Pulse 96 03/17/23 1252  Resp 15 03/17/23 1252  SpO2 92 % 03/17/23 1252  Vitals shown include unvalidated device data.  Last Pain:  Vitals:   03/17/23 0601  PainSc: 0-No pain      Patients Stated Pain Goal: 0 (03/17/23 0601)  Complications: No notable events documented.

## 2023-03-17 NOTE — Anesthesia Procedure Notes (Signed)
Arterial Line Insertion Start/End7/05/2023 6:45 AM, 03/17/2023 6:55 AM Performed by: Nils Pyle, CRNA, CRNA  Patient location: Pre-op. Preanesthetic checklist: patient identified, IV checked, site marked, risks and benefits discussed, surgical consent, monitors and equipment checked, pre-op evaluation and anesthesia consent Lidocaine 1% used for infiltration Left, radial was placed Catheter size: 20 G Hand hygiene performed  and maximum sterile barriers used   Attempts: 1 Procedure performed without using ultrasound guided technique. Following insertion, dressing applied and Biopatch. Post procedure assessment: normal and unchanged  Patient tolerated the procedure well with no immediate complications.

## 2023-03-17 NOTE — H&P (Signed)
HPI:   Karl Garcia is a 62 y.o. male with history of bilateral lower extremity short distance claudication.  He is a lifelong smoker continues to smoke daily and also has hyperlipidemia and diabetes and has a history of coronary artery bypass grafting.  Smoking 1 pack/day.  At last visit he was having disabling claudication at about 50 yards states that this is only gotten worse since that time.  He does have swelling of the left leg and has recently had swelling of the right leg but it resolved.  He does not have any tissue loss or ulceration.  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  He does take aspirin and statin no other blood thinners.       Past Medical History:  Diagnosis Date   Coronary artery disease      S/p NSTEMI 02/2020 >> s/p CABG // Echocardiogram 6/21: EF 55-60, Gr 2 DD, LAE   Diabetes mellitus Type 2     Essential hypertension     ETOH abuse     Hx of NSTEMI in 02/2020     Hyperlipidemia     L subclavian stenosis     Peripheral Arterial Disease     Tobacco abuse           Family History  Problem Relation Age of Onset   Heart attack Mother     Heart disease Father     Heart attack Father     Heart failure Father     Aneurysm Father     Healthy Brother     Heart disease Paternal Uncle           Past Surgical History:  Procedure Laterality Date   ABDOMINAL AORTOGRAM W/LOWER EXTREMITY Bilateral 07/16/2020    Procedure: ABDOMINAL AORTOGRAM W/LOWER EXTREMITY;  Surgeon: Runell Gess, MD;  Location: [facility];  Service: Cardiovascular;  Laterality: Bilateral;   CORONARY ARTERY BYPASS GRAFT N/A 02/17/2020    Procedure: CORONARY ARTERY BYPASS GRAFTING (CABG) x Three, using left internal mammry artery and right leg greater saphenous vein harvested endoscopically;  Surgeon: Kerin Perna, MD;  Location: [facility] OR;  Service: Open Heart Surgery;  Laterality: N/A;   LEFT HEART CATH AND CORONARY  ANGIOGRAPHY N/A 02/16/2020    Procedure: LEFT HEART CATH AND CORONARY ANGIOGRAPHY;  Surgeon: Runell Gess, MD;  Location: [facility];  Service: Cardiovascular;  Laterality: N/A;   TEE WITHOUT CARDIOVERSION N/A 02/17/2020    Procedure: TRANSESOPHAGEAL ECHOCARDIOGRAM (TEE);  Surgeon: Donata Clay, Theron Arista, MD;  Location: [facility] OR;  Service: Open Heart Surgery;  Laterality: N/A;   TONSILECTOMY, ADENOIDECTOMY, BILATERAL MYRINGOTOMY AND TUBES        unsure of all of that but + tonsilectomy      Short Social History:  Social History         Tobacco Use   Smoking status: Every Day      Packs/day: 1.00      Years: 50.00      Additional pack years: 0.00      Total pack years: 50.00      Types: Cigarettes   Smokeless tobacco: Never   Tobacco comments:      Discussed with patient he is interested but not committed at this time  Substance Use Topics   Alcohol use: Yes      Alcohol/week: 5.0 standard drinks of alcohol  Types: 5 Shots of liquor per week      Comment: 5 drinks weekly          Allergies  Allergen Reactions   Morphine And Codeine Rash            Current Outpatient Medications  Medication Sig Dispense Refill   aspirin EC 81 MG tablet Take 1 tablet (81 mg total) by mouth daily. Swallow whole. 100 tablet 3   atorvastatin (LIPITOR) 20 MG tablet Take 1 tablet (20 mg total) by mouth daily. 90 tablet 3    No current facility-administered medications for this visit.      Review of Systems  Constitutional:  Constitutional negative. HENT: HENT negative.  Eyes: Eyes negative.  Cardiovascular: Positive for claudication and leg swelling.  GI: Gastrointestinal negative.  Musculoskeletal: Positive for leg pain.  Neurological: Neurological negative. Hematologic: Hematologic/lymphatic negative.  Psychiatric: Psychiatric negative.          Objective:    Vitals:   03/17/23 0610  BP: (!) 163/90  Pulse: 73  Resp: 18  Temp: 98.5 F (36.9 C)  SpO2: 93%     Physical Exam HENT:     Head: Normocephalic.     Nose: Nose normal.  Eyes:     Pupils: Pupils are equal, round, and reactive to light.  Cardiovascular:     Rate and Rhythm: Normal rate.     Pulses:          Femoral pulses are 0 on the right side and 0 on the left side. Abdominal:     General: Abdomen is flat.     Palpations: Abdomen is soft.  Musculoskeletal:     Right lower leg: No edema.     Left lower leg: Edema present.  Skin:    General: Skin is dry.  Neurological:     General: No focal deficit present.     Mental Status: He is alert.  Psychiatric:        Mood and Affect: Mood normal.        Thought Content: Thought content normal.        Judgment: Judgment normal.        Data: CT IMPRESSION: VASCULAR   1. 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 2. 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 3. Bulky calcified atherosclerotic plaque in the right common femoral artery results in high-grade stenosis which extends into the proximal superficial femoral artery. 4. Bulky calcified atherosclerotic plaque results in high-grade stenosis throughout the left common iliac artery. The left external iliac artery remains patent and without significant stenosis. 5. 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 6. 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 7. Aortic and coronary artery atherosclerotic vascular calcifications. Aortic Atherosclerosis (ICD10-I70.0).      Assessment/Plan:   62 year old male with high-grade aortoiliac and common femoral stenoses with bilateral lower extremity claudication that is life-limiting from a right lower extremity standpoint but also occurs on the left.  Plan for Aortobifemoral bypass today in OR. Again discussed risks of surgery and he demonstrates good understanding.       Nazir Hacker C. Randie Heinz, MD [facility] Office: [phone] Pager: [phone]

## 2023-03-17 NOTE — Progress Notes (Signed)
  Day of Surgery Note    Subjective:  resting in recovery    Vitals:   03/17/23 1330 03/17/23 1345  BP: (!) 120/98 (!) 122/93  Pulse: 90 100  Resp: 14 16  Temp:    SpO2: 93% 95%    Incisions:   laparotomy and bilateral groin incisions are clean and dry without hematoma Extremities:  palpable left PT pulse (brisk doppler); brisk right PT doppler flow and faint right peroneal doppler signal Cardiac:  regular Lungs:  non labored Abdomen:  soft with some tenderness to palpation   Assessment/Plan:  This is a 62 y.o. 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

## 2023-03-18 ENCOUNTER — Encounter (HOSPITAL_COMMUNITY): Payer: Self-pay | Admitting: Vascular Surgery

## 2023-03-18 ENCOUNTER — Inpatient Hospital Stay (HOSPITAL_COMMUNITY): Payer: 59

## 2023-03-18 LAB — TYPE AND SCREEN
ABO/RH(D): O POS
Unit division: 0
Unit division: 0

## 2023-03-18 LAB — COMPREHENSIVE METABOLIC PANEL
ALT: 13 U/L (ref 0–44)
AST: 23 U/L (ref 15–41)
Albumin: 3.1 g/dL — ABNORMAL LOW (ref 3.5–5.0)
Alkaline Phosphatase: 41 U/L (ref 38–126)
Anion gap: 9 (ref 5–15)
BUN: 15 mg/dL (ref 8–23)
CO2: 23 mmol/L (ref 22–32)
Calcium: 7.3 mg/dL — ABNORMAL LOW (ref 8.9–10.3)
Chloride: 105 mmol/L (ref 98–111)
Creatinine, Ser: 1.16 mg/dL (ref 0.61–1.24)
GFR, Estimated: >60 mL/min (ref >60)
Glucose, Bld: 113 mg/dL — ABNORMAL HIGH (ref 70–99)
Potassium: 4.1 mmol/L (ref 3.5–5.1)
Sodium: 137 mmol/L (ref 135–145)
Total Bilirubin: 0.6 mg/dL (ref 0.3–1.2)
Total Protein: 5.2 g/dL — ABNORMAL LOW (ref 6.5–8.1)

## 2023-03-18 LAB — BPAM RBC
Blood Product Expiration Date: 202408072359
Blood Product Expiration Date: 202408082359
Blood Product Expiration Date: 202408082359
ISSUE DATE / TIME: 202407090747
ISSUE DATE / TIME: 202407090747
Unit Type and Rh: 5100

## 2023-03-18 LAB — GLUCOSE, CAPILLARY
Glucose-Capillary: 102 mg/dL — ABNORMAL HIGH (ref 70–99)
Glucose-Capillary: 102 mg/dL — ABNORMAL HIGH (ref 70–99)
Glucose-Capillary: 105 mg/dL — ABNORMAL HIGH (ref 70–99)
Glucose-Capillary: 108 mg/dL — ABNORMAL HIGH (ref 70–99)
Glucose-Capillary: 111 mg/dL — ABNORMAL HIGH (ref 70–99)
Glucose-Capillary: 112 mg/dL — ABNORMAL HIGH (ref 70–99)
Glucose-Capillary: 131 mg/dL — ABNORMAL HIGH (ref 70–99)
Glucose-Capillary: 134 mg/dL — ABNORMAL HIGH (ref 70–99)
Glucose-Capillary: 135 mg/dL — ABNORMAL HIGH (ref 70–99)
Glucose-Capillary: 135 mg/dL — ABNORMAL HIGH (ref 70–99)
Glucose-Capillary: 157 mg/dL — ABNORMAL HIGH (ref 70–99)
Glucose-Capillary: 72 mg/dL (ref 70–99)

## 2023-03-18 LAB — CBC
HCT: 30.1 % — ABNORMAL LOW (ref 39.0–52.0)
Hemoglobin: 10.4 g/dL — ABNORMAL LOW (ref 13.0–17.0)
MCH: 35.5 pg — ABNORMAL HIGH (ref 26.0–34.0)
MCHC: 34.6 g/dL (ref 30.0–36.0)
MCV: 102.7 fL — ABNORMAL HIGH (ref 80.0–100.0)
Platelets: 112 10*3/uL — ABNORMAL LOW (ref 150–400)
RBC: 2.93 MIL/uL — ABNORMAL LOW (ref 4.22–5.81)
RDW: 14.3 % (ref 11.5–15.5)
WBC: 8.4 10*3/uL (ref 4.0–10.5)
nRBC: 0 % (ref 0.0–0.2)

## 2023-03-18 LAB — AMYLASE: Amylase: 21 U/L — ABNORMAL LOW (ref 28–100)

## 2023-03-18 LAB — MAGNESIUM: Magnesium: 1.8 mg/dL (ref 1.7–2.4)

## 2023-03-18 MED ORDER — BISACODYL 10 MG RE SUPP
10.0000 mg | Freq: Once | RECTAL | Status: AC
  Administered 2023-03-18: 10 mg via RECTAL
  Filled 2023-03-18: qty 1

## 2023-03-18 MED ORDER — INSULIN ASPART 100 UNIT/ML IJ SOLN
0.0000 [IU] | Freq: Three times a day (TID) | INTRAMUSCULAR | Status: DC
  Administered 2023-03-18 – 2023-03-22 (×8): 1 [IU] via SUBCUTANEOUS

## 2023-03-18 NOTE — TOC Progression Note (Signed)
Transition of Care [facility]) - Progression Note    Patient Details  Name: Karl Garcia MRN: [id number] Date of Birth: 1960-09-22  Transition of Care [facility] CM/SW Contact  Dannielle Karvonen Phone Number: 03/18/2023, 2:12 PM  Clinical Narrative:     CSW spoke with pt in regards to alcohol use, pt states he is "working on it", declines needing any resources. TOC will continue to follow.    Expected Discharge Plan: Home w Home Health Services Barriers to Discharge: Continued Medical Work up  Expected Discharge Plan and Services In-house Referral: NA Discharge Planning Services: CM Consult   Living arrangements for the past 2 months: Single Family Home                   DME Agency: NA                   Social Determinants of Health (SDOH) Interventions SDOH Screenings   Food Insecurity: Food Insecurity Present (12/04/2022)  Housing: High Risk (12/04/2022)  Transportation Needs: No Transportation Needs (12/04/2022)  Depression (PHQ2-9): Low Risk  (03/06/2023)  Tobacco Use: High Risk (03/18/2023)    Readmission Risk Interventions     No data to display

## 2023-03-18 NOTE — Evaluation (Signed)
Physical Therapy Evaluation Patient Details Name: Karl Garcia MRN: [id number] DOB: May 14, 1961 Today's Date: 03/18/2023  History of Present Illness  Pt is a 62 y/o M admitted 03/17/23 s/p aortobifemoral bypass graft and bilateral common femoral endarterectomies. PMH includes CAD, DM2, essential HTN, PAD, HLD, tobacco abuse.  Clinical Impression  Patient presents with decreased mobility due to pain, decreased LE and core strength, decreased balance, decreased activity tolerance and decreased safety awareness.  Currently min A to close S for sit to stand from recliner and standing at the bedside.  Encouraged mobility, but with low BP and pt report of pain limited to bedside activity this session.  Patient previously independent.  Feel he will progress well once able to ambulate.  Patient concerned as his significant other works during the day.  Current recommendation for HHPT at d/c.         Assistance Recommended at Discharge Intermittent Supervision/Assistance  If plan is discharge home, recommend the following:  Can travel by private vehicle  A little help with walking and/or transfers;A little help with bathing/dressing/bathroom;Assistance with cooking/housework;Assist for transportation;Help with stairs or ramp for entrance        Equipment Recommendations None recommended by PT  Recommendations for Other Services       Functional Status Assessment Patient has had a recent decline in their functional status and demonstrates the ability to make significant improvements in function in a reasonable and predictable amount of time.     Precautions / Restrictions Precautions Precautions: Fall Precaution Comments: NGT      Mobility  Bed Mobility               General bed mobility comments: OOB in chair    Transfers Overall transfer level: Needs assistance Equipment used: Rolling walker (2 wheels) Transfers: Sit to/from Stand Sit to Stand: Min assist            General transfer comment: cues for hand placement, assist for balance, lines    Ambulation/Gait               General Gait Details: deferred, pt declined due to pain despite encouragement  Stairs            Wheelchair Mobility     Tilt Bed    Modified Rankin (Stroke Patients Only)       Balance Overall balance assessment: Needs assistance Sitting-balance support: Feet supported Sitting balance-Leahy Scale: Good     Standing balance support: Bilateral upper extremity supported Standing balance-Leahy Scale: Poor Standing balance comment: stood about 1.5 minutes with UE support and S to minguard for balance                             Pertinent Vitals/Pain Pain Assessment Pain Assessment: Faces Faces Pain Scale: Hurts even more Pain Location: abdomen Pain Descriptors / Indicators: Discomfort, Sore, Grimacing, Guarding Pain Intervention(s): Monitored during session, Repositioned, PCA encouraged    Home Living Family/patient expects to be discharged to:: Private residence Living Arrangements: Spouse/significant other (girlfriend) Available Help at Discharge: Friend(s);Available PRN/intermittently Type of Home: House Home Access: Level entry       Home Layout: Two level;Able to live on main level with bedroom/bathroom Home Equipment: Rolling Walker (2 wheels) Additional Comments: girlfriend works for the post office    Prior Function Prior Level of Function : Independent/Modified Independent             Mobility Comments: ind, no AD  Hand Dominance   Dominant Hand: Right    Extremity/Trunk Assessment   Upper Extremity Assessment Upper Extremity Assessment: Defer to OT evaluation    Lower Extremity Assessment Lower Extremity Assessment: RLE deficits/detail;LLE deficits/detail RLE Deficits / Details: soreness with hip flexion with groin wounds B; knee extension, ankle DF WFL LLE Deficits / Details: soreness with hip  flexion with groin wounds B; knee extension, ankle DF WFL       Communication   Communication: No difficulties  Cognition Arousal/Alertness: Awake/alert Behavior During Therapy: WFL for tasks assessed/performed Overall Cognitive Status: Within Functional Limits for tasks assessed                                          General Comments General comments (skin integrity, edema, etc.): SBP 116 sitting 88 standing with minimal symptoms, SpO2 low 90's on 5L O2    Exercises Other Exercises Other Exercises: seated marches x 5, LAQ x 10, AP's x 10   Assessment/Plan    PT Assessment Patient needs continued PT services  PT Problem List Decreased strength;Decreased balance;Pain;Decreased activity tolerance;Decreased mobility;Cardiopulmonary status limiting activity       PT Treatment Interventions DME instruction;Functional mobility training;Balance training;Patient/family education;Therapeutic activities;Gait training;Therapeutic exercise;Stair training    PT Goals (Current goals can be found in the Care Plan section)  Acute Rehab PT Goals Patient Stated Goal: to return to independent PT Goal Formulation: With patient Time For Goal Achievement: 04/01/23 Potential to Achieve Goals: Good    Frequency Min 1X/week     Co-evaluation               AM-PAC PT "6 Clicks" Mobility  Outcome Measure Help needed turning from your back to your side while in a flat bed without using bedrails?: A Lot Help needed moving from lying on your back to sitting on the side of a flat bed without using bedrails?: A Lot Help needed moving to and from a bed to a chair (including a wheelchair)?: A Little Help needed standing up from a chair using your arms (e.g., wheelchair or bedside chair)?: A Little Help needed to walk in hospital room?: Total Help needed climbing 3-5 steps with a railing? : Total 6 Click Score: 12    End of Session Equipment Utilized During Treatment:  Oxygen Activity Tolerance: Patient limited by pain Patient left: in chair;with call bell/phone within reach;with chair alarm set   PT Visit Diagnosis: Other abnormalities of gait and mobility (R26.89);Difficulty in walking, not elsewhere classified (R26.2)    Time: 1610-9604 PT Time Calculation (min) (ACUTE ONLY): 26 min   Charges:   PT Evaluation $PT Eval Moderate Complexity: 1 Mod PT Treatments $Therapeutic Activity: 8-22 mins PT General Charges $$ ACUTE PT VISIT: 1 Visit         Sheran Lawless, PT Acute Rehabilitation Services Office:[phone] 03/18/2023   Elray Mcgregor 03/18/2023, 5:03 PM

## 2023-03-18 NOTE — Progress Notes (Addendum)
Aortic Surgery Progress Note    03/18/2023 6:40 AM 1 Day Post-Op  Subjective:  says he feels a little hot. Not really hungry but is thirsty.  Says his legs feel better.  Denies passing gas.   Afebrile HR 80's-120's  110's-120's systolic 93% 4LO2NC  Gtts:  insulin now off  Vitals:   03/18/23 0358 03/18/23 0400  BP: 114/71 114/71  Pulse: 86 87  Resp: 12 11  Temp:    SpO2: 92% 94%    Physical Exam: Cardiac:  regular Lungs:  non labored on O2 Abdomen:  mildly distended but non tender to palpation; - flatus Incisions:  laparotomy and bilateral groins are clean and intact without hematoma.   Extremities:  bilateral feet are warm and well perfused.  Left PT pulse is palpable.  Bilateral calves are soft and non tender General:  no distress in chair.   CBC    Component Value Date/Time   WBC 8.4 03/18/2023 0406   RBC 2.93 (L) 03/18/2023 0406   HGB 10.4 (L) 03/18/2023 0406   HGB 12.4 (L) 06/21/2020 1102   HCT 30.1 (L) 03/18/2023 0406   HCT 35.7 (L) 06/21/2020 1102   PLT 112 (L) 03/18/2023 0406   PLT 199 06/21/2020 1102   MCV 102.7 (H) 03/18/2023 0406   MCV 93 06/21/2020 1102   MCH 35.5 (H) 03/18/2023 0406   MCHC 34.6 03/18/2023 0406   RDW 14.3 03/18/2023 0406   RDW 13.7 06/21/2020 1102   LYMPHSABS 967 12/03/2022 1055   LYMPHSABS 1.4 06/21/2020 1102   MONOABS 1.2 (H) 09/23/2013 1835   EOSABS 81 12/03/2022 1055   EOSABS 0.2 06/21/2020 1102   BASOSABS 32 12/03/2022 1055   BASOSABS 0.1 06/21/2020 1102    BMET    Component Value Date/Time   NA 137 03/18/2023 0406   NA 141 06/27/2020 1649   K 4.1 03/18/2023 0406   CL 105 03/18/2023 0406   CO2 23 03/18/2023 0406   GLUCOSE 113 (H) 03/18/2023 0406   BUN 15 03/18/2023 0406   BUN 10 06/27/2020 1649   CREATININE 1.16 03/18/2023 0406   CREATININE 0.82 12/03/2022 1055   CALCIUM 7.3 (L) 03/18/2023 0406   GFRNONAA >60 03/18/2023 0406   GFRAA 120 06/27/2020 1649    INR    Component Value Date/Time   INR 1.2  03/17/2023 1300     Intake/Output Summary (Last 24 hours) at 03/18/2023 0640 Last data filed at 03/18/2023 0500 Gross per 24 hour  Intake 6777.04 ml  Output 3285 ml  Net 3492.04 ml     Assessment/Plan:  62 y.o. male is s/p  Aortobifemoral bypass grafting with 14 x 7 mm dacryon with aortic and bilateral common femoral endarterectomies and ligation of inferior mesenteric artery  1 Day Post-Op  -Vascular:  bilateral feet are warm and well perfused and subjectively feels better.  Calves are soft and no evidence of compartment syndrome.  -Cardiac:  regular; hemodynamically stable.  A-line will be discontinued.  -Pulmonary:  extubated in OR-on 4LO2NC.  IS every hour.  -Neuro:  intact -Renal:  renal function normal with good uop, d/c foley catheter -GI:  no flatus; NGT output 250cc/24hr.  200cc last shift. NGT is clamped currently and if no nausea in 6 hours, this can be removed and started on clear liquids for dinner.  Will give dulcolax supp to stimulate bowels.   -Incisions:  midline and bilateral groin incisions look good.  Dry gauze to bilateral groins when sitting.   -Heme/ID:  acute surgical  blood loss anemia-hgb stable at 10.4.  he has not required transfusion.  He received 490cc cell saver intraoperatively. -General:  no distress.  Continue OOB to chair tid and working with OT/PT.  -pain pump for pain control.   -start po meds when taking in po's -discontinue foley.  Pt was seen by urology last week and was started on Flomax-per pt, he has not started this yet and is for LUTS (lower urinary tract symptoms) and nodular prostate in setting of elevated PSA. He is going to have prostate u/s biopsy in a 4-5 weeks after this hospitalization. -etoh use-will monitor for withdrawal.  May need CIWA protocol but will continue to monitor currently. -elevated glucose-insulin gtt weaned off-will place SSI orders   Doreatha Massed, PA-C [facility] [phone] 03/18/2023 6:40  AM   I have independently interviewed and examined patient and agree with PA assessment and plan above.   Derrel Moore C. Randie Heinz, MD [facility] [place] Office: [phone] Pager: [phone]

## 2023-03-18 NOTE — Progress Notes (Signed)
PT Cancellation Note  Patient Details Name: Karl Garcia MRN: [id number] DOB: 05-Mar-1961   Cancelled Treatment:    Reason Eval/Treat Not Completed: Medical issues which prohibited therapy; RN reports pt with hypotension and elevated HR just repositioning in chair.  Requesting PT to attempt later today.  Will continue attempts.    Elray Mcgregor 03/18/2023, 11:25 AM Sheran Lawless, PT Acute Rehabilitation Services Office:[phone] 03/18/2023

## 2023-03-18 NOTE — Evaluation (Signed)
Occupational Therapy Evaluation Patient Details Name: Karl Garcia MRN: [id number] DOB: 29-Apr-1961 Today's Date: 03/18/2023   History of Present Illness Pt is a 62 y/o M s/p aortobifemoral bypass graft and bilateral common femoral endarterectomies. PMH includes CAD, DM2, essential HTN, PAD, HLD, tobacco abuse.   Clinical Impression   Pt reports independence at baseline with ADLs and functional mobility, was living with his girlfriend and her daughter but reports he isn't sure how much help he'll be able to have from her at home. Pt currently limited by pain, needing set up - max A for ADLs, and min A for sit to stand transfer from chair level. Pt with soft BP and mildly symptomatic, RN notified. Pt presenting with impairments listed below, will follow acutely. Patient will benefit from intensive inpatient follow up therapy, >3 hours/day to maximize safety/ind with ADLs/functional mobility.  BP seated 85/66 (73)  BP seated after standing for a few mins 69/56 (62 ) BP after ~3 mins sitting in chair 76/60 (67)     Recommendations for follow up therapy are one component of a multi-disciplinary discharge planning process, led by the attending physician.  Recommendations may be updated based on patient status, additional functional criteria and insurance authorization.   Assistance Recommended at Discharge Intermittent Supervision/Assistance  Patient can return home with the following A lot of help with walking and/or transfers;A lot of help with bathing/dressing/bathroom;Assistance with cooking/housework;Direct supervision/assist for medications management;Direct supervision/assist for financial management;Assist for transportation;Help with stairs or ramp for entrance    Functional Status Assessment  Patient has had a recent decline in their functional status and demonstrates the ability to make significant improvements in function in a reasonable and predictable amount of time.  Equipment  Recommendations  Other (comment) (defer)    Recommendations for Other Services PT consult     Precautions / Restrictions Precautions Precautions: Fall;None Restrictions Weight Bearing Restrictions: No      Mobility Bed Mobility               General bed mobility comments: OOB in chair upon arrival and departure    Transfers Overall transfer level: Needs assistance Equipment used: Rolling walker (2 wheels) Transfers: Sit to/from Stand Sit to Stand: Min assist                  Balance Overall balance assessment: Needs assistance Sitting-balance support: Feet supported Sitting balance-Leahy Scale: Fair     Standing balance support: During functional activity, Reliant on assistive device for balance Standing balance-Leahy Scale: Poor                             ADL either performed or assessed with clinical judgement   ADL Overall ADL's : Needs assistance/impaired Eating/Feeding: NPO   Grooming: Set up;Sitting   Upper Body Bathing: Moderate assistance;Sitting   Lower Body Bathing: Maximal assistance;Sitting/lateral leans   Upper Body Dressing : Moderate assistance;Sitting   Lower Body Dressing: Maximal assistance;Sitting/lateral leans   Toilet Transfer: Moderate assistance;Stand-pivot;BSC/3in1;Rolling walker (2 wheels)   Toileting- Clothing Manipulation and Hygiene: Total assistance Toileting - Clothing Manipulation Details (indicate cue type and reason): catheter     Functional mobility during ADLs: Minimal assistance;Rolling walker (2 wheels)       Vision Baseline Vision/History: 1 Wears glasses Vision Assessment?: No apparent visual deficits     Perception Perception Perception Tested?: No   Praxis Praxis Praxis tested?: Not tested    Pertinent Vitals/Pain Pain Assessment Pain  Assessment: Faces Pain Score: 6  Faces Pain Scale: Hurts even more Pain Location: abdomen Pain Descriptors / Indicators: Discomfort Pain  Intervention(s): Limited activity within patient's tolerance, Monitored during session, Repositioned     Hand Dominance Right   Extremity/Trunk Assessment Upper Extremity Assessment Upper Extremity Assessment: Generalized weakness   Lower Extremity Assessment Lower Extremity Assessment: Defer to PT evaluation   Cervical / Trunk Assessment Cervical / Trunk Assessment: Normal   Communication Communication Communication: No difficulties   Cognition Arousal/Alertness: Awake/alert Behavior During Therapy: WFL for tasks assessed/performed Overall Cognitive Status: Within Functional Limits for tasks assessed                                 General Comments: tangential in conversation at times     General Comments  See note for BP, pt orthostatic but with mild symptoms, SpO2 88- low 90's on 5L O2    Exercises     Shoulder Instructions      Home Living Family/patient expects to be discharged to:: Private residence Living Arrangements: Spouse/significant other (and girlfriend's child) Available Help at Discharge: Friend(s);Available PRN/intermittently Type of Home: House Home Access: Level entry     Home Layout: Two level;Able to live on main level with bedroom/bathroom     Bathroom Shower/Tub: Walk-in shower         Home Equipment: Agricultural consultant (2 wheels)   Additional Comments: was living with a girlfriend but reports she cannot assist, does not know if he will be able to return, reports he is "homeless"      Prior Functioning/Environment Prior Level of Function : Independent/Modified Independent             Mobility Comments: ind, no AD ADLs Comments: ind        OT Problem List: Decreased strength;Decreased range of motion;Decreased activity tolerance;Impaired balance (sitting and/or standing);Cardiopulmonary status limiting activity      OT Treatment/Interventions: Self-care/ADL training;Therapeutic exercise;Energy conservation;DME  and/or AE instruction;Therapeutic activities;Patient/family education;Balance training    OT Goals(Current goals can be found in the care plan section) Acute Rehab OT Goals Patient Stated Goal: none stated OT Goal Formulation: With patient Time For Goal Achievement: 04/01/23 Potential to Achieve Goals: Good ADL Goals Pt Will Perform Upper Body Dressing: with min guard assist;sitting Pt Will Perform Lower Body Dressing: with min guard assist;sitting/lateral leans;sit to/from stand Pt Will Transfer to Toilet: with min guard assist;ambulating;regular height toilet Additional ADL Goal #1: pt will stand x5 min for functional task in order to improve activity tolerance for ADLs  OT Frequency: Min 1X/week    Co-evaluation              AM-PAC OT "6 Clicks" Daily Activity     Outcome Measure Help from another person eating meals?: A Little Help from another person taking care of personal grooming?: A Little Help from another person toileting, which includes using toliet, bedpan, or urinal?: A Lot Help from another person bathing (including washing, rinsing, drying)?: A Lot Help from another person to put on and taking off regular upper body clothing?: A Lot Help from another person to put on and taking off regular lower body clothing?: A Lot 6 Click Score: 14   End of Session Equipment Utilized During Treatment: Rolling walker (2 wheels);Oxygen Nurse Communication: Mobility status  Activity Tolerance: Patient tolerated treatment well Patient left: in chair;with call bell/phone within reach  OT Visit Diagnosis: Unsteadiness on feet (R26.81);Other  abnormalities of gait and mobility (R26.89);Muscle weakness (generalized) (M62.81)                Time: 1610-9604 OT Time Calculation (min): 36 min Charges:  OT General Charges $OT Visit: 1 Visit OT Evaluation $OT Eval Moderate Complexity: 1 Mod OT Treatments $Therapeutic Activity: 8-22 mins  Carver Fila, OTD, OTR/L SecureChat  Preferred Acute Rehab [phone]   Carver Fila Koonce 03/18/2023, 8:57 AM

## 2023-03-18 NOTE — TOC Initial Note (Signed)
Transition of Care [facility]) - Initial/Assessment Note    Patient Details  Name: Karl Garcia MRN: [id number] Date of Birth: Jun 24, 1961  [facility]) CM/SW Contact:    Gala Lewandowsky, RN Phone Number: 03/18/2023, 1:04 PM  Clinical Narrative: Patient presented for  aortobifemoral bypass grafting. Currently on dilaudid PCA pump. Patient was discussed in morning rounds. PTA patient was from home with the support of his girlfriend. Patient states girlfriend works eight hours a day and will not be in the home. Patient is concerned regarding disposition needs. Case Manager asked for PT/OT consult for recommendations. Patient states girlfriend has a cane and walker if needs to use that post hospitalization. Case Manager will continue to follow for transition of care needs as the patient progresses.               Expected Discharge Plan: Home w Home Health Services Barriers to Discharge: Continued Medical Work up   Patient Goals and CMS Choice Patient states their goals for this hospitalization and ongoing recovery are:: patient wants to make sure he has adequate care post hospitlization.   Choice offered to / list presented to : NA   Expected Discharge Plan and Services In-house Referral: NA Discharge Planning Services: CM Consult   Living arrangements for the past 2 months: Single Family Home                   DME Agency: NA   Prior Living Arrangements/Services Living arrangements for the past 2 months: Single Family Home Lives with:: Significant Other Patient language and need for interpreter reviewed:: Yes        Need for Family Participation in Patient Care: Yes (Comment) Care giver support system in place?: Yes (comment)   Criminal Activity/Legal Involvement Pertinent to Current Situation/Hospitalization: No - Comment as needed  Emotional Assessment Appearance:: Appears stated age Attitude/Demeanor/Rapport: Engaged Affect (typically observed):  Appropriate Orientation: : Oriented to Self, Oriented to Place, Oriented to  Time Alcohol / Substance Use: Not Applicable Psych Involvement: No (comment)  Admission diagnosis:  PAD (peripheral artery disease) (HCC) [I73.9] Aortoiliac occlusive disease (HCC) [I74.09] Patient Active Problem List   Diagnosis Date Noted   PAD (peripheral artery disease) (HCC) 03/17/2023   Aortoiliac occlusive disease (HCC) 03/17/2023   Encounter for post surgical wound check 05/16/2020   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 [place] [address] Phone: [phone] Fax: [phone]  Social Determinants of Health (SDOH) Social History: SDOH Screenings   Food Insecurity: Food Insecurity Present (12/04/2022)  Housing: High Risk (12/04/2022)  Transportation Needs: No Transportation Needs (12/04/2022)  Depression (PHQ2-9): Low Risk  (03/06/2023)  Tobacco Use: High Risk (03/17/2023)   Readmission Risk Interventions     No data to display

## 2023-03-19 ENCOUNTER — Other Ambulatory Visit: Payer: Self-pay

## 2023-03-19 ENCOUNTER — Encounter (HOSPITAL_COMMUNITY): Payer: Self-pay | Admitting: Vascular Surgery

## 2023-03-19 LAB — BASIC METABOLIC PANEL
Anion gap: 7 (ref 5–15)
Anion gap: 10 (ref 5–15)
BUN: 21 mg/dL (ref 8–23)
BUN: 18 mg/dL (ref 8–23)
CO2: 21 mmol/L — ABNORMAL LOW (ref 22–32)
CO2: 21 mmol/L — ABNORMAL LOW (ref 22–32)
Calcium: 6.9 mg/dL — ABNORMAL LOW (ref 8.9–10.3)
Calcium: 7.2 mg/dL — ABNORMAL LOW (ref 8.9–10.3)
Chloride: 99 mmol/L (ref 98–111)
Chloride: 98 mmol/L (ref 98–111)
Creatinine, Ser: 1.4 mg/dL — ABNORMAL HIGH (ref 0.61–1.24)
Creatinine, Ser: 1.34 mg/dL — ABNORMAL HIGH (ref 0.61–1.24)
GFR, Estimated: 56 mL/min — ABNORMAL LOW (ref >60)
GFR, Estimated: 59 mL/min — ABNORMAL LOW (ref >60)
Glucose, Bld: 157 mg/dL — ABNORMAL HIGH (ref 70–99)
Glucose, Bld: 111 mg/dL — ABNORMAL HIGH (ref 70–99)
Potassium: 3.8 mmol/L (ref 3.5–5.1)
Potassium: 3.8 mmol/L (ref 3.5–5.1)
Sodium: 127 mmol/L — ABNORMAL LOW (ref 135–145)
Sodium: 129 mmol/L — ABNORMAL LOW (ref 135–145)

## 2023-03-19 LAB — CBC
HCT: 24.9 % — ABNORMAL LOW (ref 39.0–52.0)
HCT: 28.1 % — ABNORMAL LOW (ref 39.0–52.0)
Hemoglobin: 8.3 g/dL — ABNORMAL LOW (ref 13.0–17.0)
Hemoglobin: 9.5 g/dL — ABNORMAL LOW (ref 13.0–17.0)
MCH: 34 pg (ref 26.0–34.0)
MCH: 33.7 pg (ref 26.0–34.0)
MCHC: 33.3 g/dL (ref 30.0–36.0)
MCHC: 33.8 g/dL (ref 30.0–36.0)
MCV: 102 fL — ABNORMAL HIGH (ref 80.0–100.0)
MCV: 99.6 fL (ref 80.0–100.0)
Platelets: 103 10*3/uL — ABNORMAL LOW (ref 150–400)
Platelets: 105 10*3/uL — ABNORMAL LOW (ref 150–400)
RBC: 2.44 MIL/uL — ABNORMAL LOW (ref 4.22–5.81)
RBC: 2.82 MIL/uL — ABNORMAL LOW (ref 4.22–5.81)
RDW: 14.6 % (ref 11.5–15.5)
RDW: 15 % (ref 11.5–15.5)
WBC: 7.4 10*3/uL (ref 4.0–10.5)
WBC: 8.1 10*3/uL (ref 4.0–10.5)
nRBC: 0 % (ref 0.0–0.2)
nRBC: 0 % (ref 0.0–0.2)

## 2023-03-19 LAB — GLUCOSE, CAPILLARY
Glucose-Capillary: 146 mg/dL — ABNORMAL HIGH (ref 70–99)
Glucose-Capillary: 125 mg/dL — ABNORMAL HIGH (ref 70–99)
Glucose-Capillary: 107 mg/dL — ABNORMAL HIGH (ref 70–99)
Glucose-Capillary: 109 mg/dL — ABNORMAL HIGH (ref 70–99)

## 2023-03-19 LAB — PREPARE RBC (CROSSMATCH)

## 2023-03-19 MED ORDER — THIAMINE HCL 100 MG/ML IJ SOLN
Freq: Once | INTRAVENOUS | Status: AC
  Filled 2023-03-19: qty 1000

## 2023-03-19 MED ORDER — DIPHENHYDRAMINE HCL 25 MG PO CAPS: 25.0000 mg | ORAL_CAPSULE | Freq: Four times a day (QID) | ORAL | Status: DC | PRN

## 2023-03-19 MED ORDER — ASPIRIN 81 MG PO TBEC
81.0000 mg | DELAYED_RELEASE_TABLET | Freq: Every day | ORAL | Status: DC
  Administered 2023-03-19 – 2023-03-23 (×5): 81 mg via ORAL
  Filled 2023-03-19 (×5): qty 1

## 2023-03-19 MED ORDER — SODIUM CHLORIDE 0.9% IV SOLUTION: Freq: Once | INTRAVENOUS | Status: AC

## 2023-03-19 MED ORDER — ATORVASTATIN CALCIUM 10 MG PO TABS
20.0000 mg | ORAL_TABLET | Freq: Every day | ORAL | Status: DC
  Administered 2023-03-19 – 2023-03-23 (×5): 20 mg via ORAL
  Filled 2023-03-19 (×5): qty 2

## 2023-03-19 MED FILL — Sodium Chloride IV Soln 0.9%: INTRAVENOUS | Qty: 1000 | Status: AC

## 2023-03-19 MED FILL — Heparin Sodium (Porcine) Inj 1000 Unit/ML: INTRAMUSCULAR | Qty: 30 | Status: AC

## 2023-03-19 MED FILL — Sodium Chloride Irrigation Soln 0.9%: Qty: 3000 | Status: AC

## 2023-03-19 NOTE — Progress Notes (Signed)
Physical Therapy Treatment Patient Details Name: Karl Garcia MRN: [id number] DOB: 1961/04/15 Today's Date: 03/19/2023   History of Present Illness Pt is a 62 y/o M admitted 03/17/23 s/p aortobifemoral bypass graft and bilateral common femoral endarterectomies. PMH includes CAD, DM2, essential HTN, PAD, HLD, tobacco abuse.    PT Comments  Progressing as expected with mobility and able to amb in hallway with assist. Anticipate continued progress with plan to return home.      Assistance Recommended at Discharge Intermittent Supervision/Assistance  If plan is discharge home, recommend the following:  Can travel by private vehicle    Assist for transportation;Assistance with cooking/housework      Equipment Recommendations  None recommended by PT    Recommendations for Other Services       Precautions / Restrictions Precautions Precautions: Other (comment) Precaution Comments: lines/tubes     Mobility  Bed Mobility               General bed mobility comments: OOB in chair    Transfers Overall transfer level: Needs assistance Equipment used: Rolling walker (2 wheels) Transfers: Sit to/from Stand Sit to Stand: Min guard           General transfer comment: Assist for safety and lines    Ambulation/Gait Ambulation/Gait assistance: Min guard Gait Distance (Feet): 120 Feet Assistive device: Rolling walker (2 wheels) Gait Pattern/deviations: Step-through pattern, Decreased step length - left, Decreased step length - right, Trunk flexed Gait velocity: decr Gait velocity interpretation: 1.31 - 2.62 ft/sec, indicative of limited community ambulator   General Gait Details: Assist for safety and lines. Verbal cues to push rolling walker instead of picking it up   Stairs             Wheelchair Mobility     Tilt Bed    Modified Rankin (Stroke Patients Only)       Balance Overall balance assessment: Needs assistance Sitting-balance support: No  upper extremity supported, Feet supported Sitting balance-Leahy Scale: Normal     Standing balance support: No upper extremity supported Standing balance-Leahy Scale: Fair                              Cognition Arousal/Alertness: Awake/alert Behavior During Therapy: WFL for tasks assessed/performed Overall Cognitive Status: Within Functional Limits for tasks assessed                                          Exercises      General Comments General comments (skin integrity, edema, etc.): VSS on RA. Poor pleth with SpO2 monitor when mobilizing      Pertinent Vitals/Pain Pain Assessment Pain Assessment: Faces Faces Pain Scale: Hurts even more Pain Location: rt thigh Pain Descriptors / Indicators: Burning Pain Intervention(s): Limited activity within patient's tolerance, Monitored during session, PCA encouraged    Home Living                          Prior Function            PT Goals (current goals can now be found in the care plan section) Acute Rehab PT Goals Patient Stated Goal: to return to independent Progress towards PT goals: Progressing toward goals    Frequency    Min 1X/week      PT Plan  Current plan remains appropriate    Co-evaluation              AM-PAC PT "6 Clicks" Mobility   Outcome Measure  Help needed turning from your back to your side while in a flat bed without using bedrails?: A Little Help needed moving from lying on your back to sitting on the side of a flat bed without using bedrails?: A Little Help needed moving to and from a bed to a chair (including a wheelchair)?: A Little Help needed standing up from a chair using your arms (e.g., wheelchair or bedside chair)?: A Little Help needed to walk in hospital room?: A Little Help needed climbing 3-5 steps with a railing? : A Lot 6 Click Score: 17    End of Session   Activity Tolerance: Patient tolerated treatment well Patient left: in  chair;with call bell/phone within reach Nurse Communication: Mobility status PT Visit Diagnosis: Other abnormalities of gait and mobility (R26.89);Difficulty in walking, not elsewhere classified (R26.2);Pain Pain - Right/Left: Right Pain - part of body: Leg     Time: 1610-9604 PT Time Calculation (min) (ACUTE ONLY): 25 min  Charges:    $Gait Training: 23-37 mins PT General Charges $$ ACUTE PT VISIT: 1 Visit                     [facility] PT Acute Rehabilitation Services Office [phone]    Angelina Ok [facility] 03/19/2023, 11:53 AM

## 2023-03-19 NOTE — Plan of Care (Signed)
  Problem: Education: Goal: Knowledge of General Education information will improve Description: Including pain rating scale, medication(s)/side effects and non-pharmacologic comfort measures Outcome: Progressing   Problem: Clinical Measurements: Goal: Will remain free from infection Outcome: Progressing   Problem: Nutrition: Goal: Adequate nutrition will be maintained Outcome: Progressing   Problem: Coping: Goal: Level of anxiety will decrease Outcome: Progressing   Problem: Elimination: Goal: Will not experience complications related to urinary retention Outcome: Progressing   

## 2023-03-19 NOTE — Progress Notes (Addendum)
Aortic Surgery Progress Note    03/19/2023 6:45 AM 2 Days Post-Op  Subjective:  up in chair; says he feels a little better.  Says he had a couple of bowel movements overnight and could not get to the bathroom.   RN reports it was blood tinged.  RN reports rash on his thighs and abdomen.  Pt states it itches.  RN reports blood pressure dropped to 60's when standing yesterday but rebounded.  Pt denies any nausea/vomiting. Says he felt anxious yesterday  Tm 99.8 HR 80's-120's systolic 80's-100's systolic 95% 5LO2NC  Gtts:  none  Vitals:   03/19/23 0400 03/19/23 0415  BP: 93/74 100/80  Pulse: 89 89  Resp: 14 13  Temp:    SpO2: 96% 96%    Physical Exam: Cardiac:  regular Lungs:  non labored Abdomen:  soft; -flatus; +BM Incisions:  midline and bilateral groin incisions look good.   Extremities:  brisk DP doppler flow bilaterally; bilateral calves are soft and non tender.  General:  no distress sitting in chair.    CBC    Component Value Date/Time   WBC 7.4 03/19/2023 0405   RBC 2.44 (L) 03/19/2023 0405   HGB 8.3 (L) 03/19/2023 0405   HGB 12.4 (L) 06/21/2020 1102   HCT 24.9 (L) 03/19/2023 0405   HCT 35.7 (L) 06/21/2020 1102   PLT 103 (L) 03/19/2023 0405   PLT 199 06/21/2020 1102   MCV 102.0 (H) 03/19/2023 0405   MCV 93 06/21/2020 1102   MCH 34.0 03/19/2023 0405   MCHC 33.3 03/19/2023 0405   RDW 14.6 03/19/2023 0405   RDW 13.7 06/21/2020 1102   LYMPHSABS 967 12/03/2022 1055   LYMPHSABS 1.4 06/21/2020 1102   MONOABS 1.2 (H) 09/23/2013 1835   EOSABS 81 12/03/2022 1055   EOSABS 0.2 06/21/2020 1102   BASOSABS 32 12/03/2022 1055   BASOSABS 0.1 06/21/2020 1102    BMET    Component Value Date/Time   NA 127 (L) 03/19/2023 0405   NA 141 06/27/2020 1649   K 3.8 03/19/2023 0405   CL 99 03/19/2023 0405   CO2 21 (L) 03/19/2023 0405   GLUCOSE 157 (H) 03/19/2023 0405   BUN 21 03/19/2023 0405   BUN 10 06/27/2020 1649   CREATININE 1.40 (H) 03/19/2023 0405   CREATININE  0.82 12/03/2022 1055   CALCIUM 6.9 (L) 03/19/2023 0405   GFRNONAA 56 (L) 03/19/2023 0405   GFRAA 120 06/27/2020 1649    INR    Component Value Date/Time   INR 1.2 03/17/2023 1300     Intake/Output Summary (Last 24 hours) at 03/19/2023 0645 Last data filed at 03/18/2023 2300 Gross per 24 hour  Intake 1691.69 ml  Output 265 ml  Net 1426.69 ml     Assessment/Plan:  62 y.o. male is s/p  Aortobifemoral bypass grafting with 14 x 7 mm dacryon with aortic and bilateral common femoral endarterectomies and ligation of inferior mesenteric artery  2 Days Post-Op  -Vascular:  pt with brisk doppler flow bilateral feet.  No evidence of compartment syndrome -Cardiac:  not requiring pressor support.  Orthostatic when getting up and soft pressures with hgb 8.3.  will transfuse 2 units. -Pulmonary:  pt on 5LO2NC-encouraged pt to continue working on IS every hour.  Continue to increase mobilization.   -Neuro:  in tact -Renal:  creatinine 1.4 pt had 600cc uop overnight.  Hyponatremia-pt on 100cc/hr 0.9 NaCl-will heplock IV given he will receive 2 units PRBCs.  Given hx of etoh use, will give  one banana bag today and check labs this afternoon. -GI:  pt with +BM after supp.  NGT removed yesterday.   RN reports blood tinged.  Abdomen is mildly distended but soft and unchanged from yesterday.  Continue IV protonix for now.   He denies N/V and tolerating clear liquids.  Will restart po asa/statin.  -Incisions:  all incisions look fine.  Dry gauze to bilateral groins to wick moisture to help prevent wound infection -Heme/ID:  acute surgical blood loss anemia-hgb down to 8.3 from 10.4 yesterday.  BP is soft.  As above, may benefit from one unit PRBC -General:  Tm 99.8 this am-continue IS -scrotal swelling-elevate  -continue mobilization -encouraged pt to continue to use pillow to cough, laugh, sneeze -will hold on starting Flomax given he had not started this yet, he is able to void and it causes orthostatic  hypotension. -keep in ICU today -benadryl for rash.  Given it is abdomen and thighs, may be contact rash from gown.     Doreatha Massed, PA-C [facility] [phone] 03/19/2023 6:45 AM   I have independently interviewed and examined patient and agree with PA assessment and plan above.  Abdominal exam is benign but with mucousy blood reported there is some concern for ischemic colitis.  His hemodynamics are stable without leukocytosis today.  Plan for transfusion of 1 unit and follow-up CBC with likely need for second unit.  Banana bag has been started with concern for withdrawal and hyponatremia this morning.  Will continue ICU monitoring today.  Jackolyn Geron C. Randie Heinz, MD [facility] [place] Office: [phone] Pager: [phone]

## 2023-03-20 LAB — BASIC METABOLIC PANEL
Anion gap: 8 (ref 5–15)
BUN: 15 mg/dL (ref 8–23)
CO2: 20 mmol/L — ABNORMAL LOW (ref 22–32)
Calcium: 7.2 mg/dL — ABNORMAL LOW (ref 8.9–10.3)
Chloride: 101 mmol/L (ref 98–111)
Creatinine, Ser: 1.07 mg/dL (ref 0.61–1.24)
GFR, Estimated: >60 mL/min (ref >60)
Glucose, Bld: 106 mg/dL — ABNORMAL HIGH (ref 70–99)
Potassium: 3.8 mmol/L (ref 3.5–5.1)
Sodium: 129 mmol/L — ABNORMAL LOW (ref 135–145)

## 2023-03-20 LAB — TYPE AND SCREEN
ABO/RH(D): O POS
Antibody Screen: NEGATIVE
Unit division: 0

## 2023-03-20 LAB — GLUCOSE, CAPILLARY
Glucose-Capillary: 105 mg/dL — ABNORMAL HIGH (ref 70–99)
Glucose-Capillary: 120 mg/dL — ABNORMAL HIGH (ref 70–99)
Glucose-Capillary: 113 mg/dL — ABNORMAL HIGH (ref 70–99)
Glucose-Capillary: 121 mg/dL — ABNORMAL HIGH (ref 70–99)

## 2023-03-20 LAB — BPAM RBC
Blood Product Expiration Date: 202408082359
ISSUE DATE / TIME: 202407111237
Unit Type and Rh: 5100

## 2023-03-20 LAB — CBC
HCT: 26.6 % — ABNORMAL LOW (ref 39.0–52.0)
Hemoglobin: 8.9 g/dL — ABNORMAL LOW (ref 13.0–17.0)
MCH: 33.3 pg (ref 26.0–34.0)
MCHC: 33.5 g/dL (ref 30.0–36.0)
MCV: 99.6 fL (ref 80.0–100.0)
Platelets: 102 10*3/uL — ABNORMAL LOW (ref 150–400)
RBC: 2.67 MIL/uL — ABNORMAL LOW (ref 4.22–5.81)
RDW: 15.6 % — ABNORMAL HIGH (ref 11.5–15.5)
WBC: 7.4 10*3/uL (ref 4.0–10.5)
nRBC: 0 % (ref 0.0–0.2)

## 2023-03-20 LAB — LIPID PANEL
Cholesterol: 108 mg/dL (ref 0–200)
HDL: 41 mg/dL (ref >40)
LDL Cholesterol: 50 mg/dL (ref 0–99)
Total CHOL/HDL Ratio: 2.6 RATIO
Triglycerides: 87 mg/dL (ref <150)
VLDL: 17 mg/dL (ref 0–40)

## 2023-03-20 MED ORDER — HEPARIN SODIUM (PORCINE) 5000 UNIT/ML IJ SOLN
5000.0000 [IU] | Freq: Three times a day (TID) | INTRAMUSCULAR | Status: DC
  Administered 2023-03-20 – 2023-03-23 (×10): 5000 [IU] via SUBCUTANEOUS
  Filled 2023-03-20 (×10): qty 1

## 2023-03-20 MED ORDER — POTASSIUM CHLORIDE CRYS ER 20 MEQ PO TBCR
20.0000 meq | EXTENDED_RELEASE_TABLET | Freq: Once | ORAL | Status: AC
  Administered 2023-03-20: 20 meq via ORAL
  Filled 2023-03-20: qty 1

## 2023-03-20 MED ORDER — FUROSEMIDE 10 MG/ML IJ SOLN
40.0000 mg | Freq: Once | INTRAMUSCULAR | Status: AC
  Administered 2023-03-20: 40 mg via INTRAVENOUS
  Filled 2023-03-20: qty 4

## 2023-03-20 MED ORDER — PANTOPRAZOLE SODIUM 40 MG PO TBEC
40.0000 mg | DELAYED_RELEASE_TABLET | Freq: Every day | ORAL | Status: DC
  Administered 2023-03-20 – 2023-03-23 (×4): 40 mg via ORAL
  Filled 2023-03-20 (×4): qty 1

## 2023-03-20 MED ORDER — HYDROMORPHONE HCL 1 MG/ML IJ SOLN
0.5000 mg | INTRAMUSCULAR | Status: DC | PRN
  Administered 2023-03-20: 0.5 mg via INTRAVENOUS
  Filled 2023-03-20: qty 0.5

## 2023-03-20 MED ORDER — OXYCODONE-ACETAMINOPHEN 5-325 MG PO TABS
1.0000 | ORAL_TABLET | ORAL | Status: DC | PRN
  Administered 2023-03-20 – 2023-03-23 (×15): 2 via ORAL
  Filled 2023-03-20 (×15): qty 2

## 2023-03-20 MED ORDER — METHOCARBAMOL 500 MG PO TABS
500.0000 mg | ORAL_TABLET | Freq: Four times a day (QID) | ORAL | Status: DC | PRN
  Administered 2023-03-20 – 2023-03-22 (×6): 500 mg via ORAL
  Filled 2023-03-20 (×6): qty 1

## 2023-03-20 NOTE — Progress Notes (Signed)
Physical Therapy Treatment Patient Details Name: Karl Garcia MRN: [id number] DOB: 29-Apr-1961 Today's Date: 03/20/2023   History of Present Illness Pt is a 62 y/o M admitted 03/17/23 s/p aortobifemoral bypass graft and bilateral common femoral endarterectomies. PMH includes CAD, DM2, essential HTN, PAD, HLD, tobacco abuse.    PT Comments  Pt making excellent progress and expect continued progress toward return home.      Assistance Recommended at Discharge Intermittent Supervision/Assistance  If plan is discharge home, recommend the following:  Can travel by private vehicle    Assist for transportation;Assistance with cooking/housework      Equipment Recommendations  None recommended by PT    Recommendations for Other Services       Precautions / Restrictions Restrictions Weight Bearing Restrictions: No     Mobility  Bed Mobility               General bed mobility comments: OOB in chair    Transfers Overall transfer level: Needs assistance Equipment used: Rolling walker (2 wheels) Transfers: Sit to/from Stand Sit to Stand: Supervision           General transfer comment: Assist for safety    Ambulation/Gait Ambulation/Gait assistance: Supervision Gait Distance (Feet): 375 Feet Assistive device: Rolling walker (2 wheels) Gait Pattern/deviations: Step-through pattern, Trunk flexed, Decreased stride length Gait velocity: decr Gait velocity interpretation: 1.31 - 2.62 ft/sec, indicative of limited community ambulator   General Gait Details: Assist for safety and lines. Verbal cues to push rolling walker instead of picking it up   Stairs             Wheelchair Mobility     Tilt Bed    Modified Rankin (Stroke Patients Only)       Balance Overall balance assessment: Needs assistance Sitting-balance support: No upper extremity supported, Feet supported Sitting balance-Leahy Scale: Normal     Standing balance support: No upper  extremity supported Standing balance-Leahy Scale: Fair                              Cognition Arousal/Alertness: Awake/alert Behavior During Therapy: WFL for tasks assessed/performed Overall Cognitive Status: Within Functional Limits for tasks assessed                                          Exercises      General Comments General comments (skin integrity, edema, etc.): VSS on RA      Pertinent Vitals/Pain Pain Assessment Pain Assessment: Faces Faces Pain Scale: Hurts little more Pain Location: rt thigh Pain Descriptors / Indicators: Burning Pain Intervention(s): Monitored during session    Home Living                          Prior Function            PT Goals (current goals can now be found in the care plan section) Acute Rehab PT Goals Patient Stated Goal: to return to independent Progress towards PT goals: Progressing toward goals    Frequency    Min 1X/week      PT Plan Current plan remains appropriate    Co-evaluation              AM-PAC PT "6 Clicks" Mobility   Outcome Measure  Help needed turning from your back  to your side while in a flat bed without using bedrails?: A Little Help needed moving from lying on your back to sitting on the side of a flat bed without using bedrails?: A Little Help needed moving to and from a bed to a chair (including a wheelchair)?: A Little Help needed standing up from a chair using your arms (e.g., wheelchair or bedside chair)?: A Little Help needed to walk in hospital room?: A Little Help needed climbing 3-5 steps with a railing? : A Lot 6 Click Score: 17    End of Session   Activity Tolerance: Patient tolerated treatment well Patient left: in chair;with call bell/phone within reach Nurse Communication: Mobility status PT Visit Diagnosis: Other abnormalities of gait and mobility (R26.89);Difficulty in walking, not elsewhere classified (R26.2);Pain Pain -  Right/Left: Right Pain - part of body: Leg     Time: 1255-1305 PT Time Calculation (min) (ACUTE ONLY): 10 min  Charges:    $Gait Training: 8-22 mins PT General Charges $$ ACUTE PT VISIT: 1 Visit                     [facility] PT Acute Rehabilitation Services Office [phone]    Angelina Ok [facility] 03/20/2023, 4:28 PM

## 2023-03-20 NOTE — Progress Notes (Signed)
Pt arrived from ...2H.., A/ox .4..pt denies any pain, MD aware,CCMD called. CHG bath given,no further needs at this time   

## 2023-03-20 NOTE — Progress Notes (Addendum)
Aortic Surgery Progress Note    03/20/2023 6:58 AM 3 Days Post-Op  Subjective:  sitting in chair.  Has soreness but no significant abdominal pain.  RN reports no BM yesterday and no blood per rectum.  Denies any N/V.  Denies flatus.  Wants milk for his coffee  Afebrile HR 70's-90's NSR 100's-170's systolic 91% [place]   Gtts:  none  Vitals:   03/20/23 0500 03/20/23 0600  BP: (!) 174/66 (!) 160/67  Pulse: 81 80  Resp: 13 15  Temp:    SpO2: 94% 96%    Physical Exam: Cardiac:  regular Lungs:  non labored Abdomen:  mild distension but soft; non tender to palpation.  -flatus Incisions:  midline and bilateral groins look good.   Extremities:  brisk doppler flow bilateral PT signals General:  no distress; looks more comfortable today.  CBC    Component Value Date/Time   WBC 7.4 03/20/2023 0354   RBC 2.67 (L) 03/20/2023 0354   HGB 8.9 (L) 03/20/2023 0354   HGB 12.4 (L) 06/21/2020 1102   HCT 26.6 (L) 03/20/2023 0354   HCT 35.7 (L) 06/21/2020 1102   PLT 102 (L) 03/20/2023 0354   PLT 199 06/21/2020 1102   MCV 99.6 03/20/2023 0354   MCV 93 06/21/2020 1102   MCH 33.3 03/20/2023 0354   MCHC 33.5 03/20/2023 0354   RDW 15.6 (H) 03/20/2023 0354   RDW 13.7 06/21/2020 1102   LYMPHSABS 967 12/03/2022 1055   LYMPHSABS 1.4 06/21/2020 1102   MONOABS 1.2 (H) 09/23/2013 1835   EOSABS 81 12/03/2022 1055   EOSABS 0.2 06/21/2020 1102   BASOSABS 32 12/03/2022 1055   BASOSABS 0.1 06/21/2020 1102    BMET    Component Value Date/Time   NA 129 (L) 03/20/2023 0354   NA 141 06/27/2020 1649   K 3.8 03/20/2023 0354   CL 101 03/20/2023 0354   CO2 20 (L) 03/20/2023 0354   GLUCOSE 106 (H) 03/20/2023 0354   BUN 15 03/20/2023 0354   BUN 10 06/27/2020 1649   CREATININE 1.07 03/20/2023 0354   CREATININE 0.82 12/03/2022 1055   CALCIUM 7.2 (L) 03/20/2023 0354   GFRNONAA >60 03/20/2023 0354   GFRAA 120 06/27/2020 1649    INR    Component Value Date/Time   INR 1.2 03/17/2023 1300      Intake/Output Summary (Last 24 hours) at 03/20/2023 0658 Last data filed at 03/20/2023 0400 Gross per 24 hour  Intake 1881.57 ml  Output 1140 ml  Net 741.57 ml     Assessment/Plan:  62 y.o. male is s/p  Aortobifemoral bypass grafting with 14 x 7 mm dacryon with aortic and bilateral common femoral endarterectomies and ligation of inferior mesenteric artery  3 Days Post-Op  -Vascular:  brisk doppler flow bilateral PT signals -Cardiac:  regular; hemodynamically stable not requiring pressors -Pulmonary:  90-97% 2LO2NC; continue to use IS every hour.  -Neuro:  in tact -Renal:  creatinine improved with good uop.  Hyponatremia improved to 129.   -GI:  tolerating clear liquids.  -flatus or BM yesterday.  Abdomen is non tender to palpation with mild distension but soft.  No further blood per rectum.   -Incisions:  all incisions are healing  -Heme/ID:  acute surgical blood loss anemia-received one unit PRBC yesterday.  Hgb 8.9 this am down slightly from 9.5.  no evidence of active bleeding.  Will start sq heparin for DVT prophylaxis. .   -General:  looks more comfortably today.  RN reports he walked a  couple of laps yesterday.  Most likely can transfer out of unit today.  May be able to advance diet-will d/w Dr. Randie Heinz.     Doreatha Massed, PA-C [facility] [phone] 03/20/2023 6:58 AM  I have independently interviewed and examined patient and agree with PA assessment and plan above. Looks much better today. 40mg  lasix for diffuse edema now that renal function has normalized. Transfer to floor pending and has been advanced to a regular diet.     Peytyn Trine C. Randie Heinz, MD [facility] [place] Office: [phone] Pager: [phone]

## 2023-03-20 NOTE — Progress Notes (Signed)
Occupational Therapy Treatment Patient Details Name: Karl Garcia MRN: [id number] DOB: 1961/06/21 Today's Date: 03/20/2023   History of present illness Pt is a 62 y/o M admitted 03/17/23 s/p aortobifemoral bypass graft and bilateral common femoral endarterectomies. PMH includes CAD, DM2, essential HTN, PAD, HLD, tobacco abuse.   OT comments  Patient with good progress toward patient focused goals.  Completed sinkside ADL with supervision to Min A from a sit to stand level.  OT can continue efforts in the acute setting with [facility] OT recommended for post acute rehab.     Recommendations for follow up therapy are one component of a multi-disciplinary discharge planning process, led by the attending physician.  Recommendations may be updated based on patient status, additional functional criteria and insurance authorization.    Assistance Recommended at Discharge Intermittent Supervision/Assistance  Patient can return home with the following  Assist for transportation;Assistance with cooking/housework   Equipment Recommendations  None recommended by OT    Recommendations for Other Services      Precautions / Restrictions Precautions Precautions: Fall Restrictions Weight Bearing Restrictions: No       Mobility Bed Mobility               General bed mobility comments: OOB in chair    Transfers Overall transfer level: Needs assistance Equipment used: Rolling walker (2 wheels) Transfers: Sit to/from Stand, Bed to chair/wheelchair/BSC Sit to Stand: Supervision     Step pivot transfers: Supervision           Balance Overall balance assessment: Needs assistance Sitting-balance support: No upper extremity supported, Feet supported Sitting balance-Leahy Scale: Normal     Standing balance support: Reliant on assistive device for balance Standing balance-Leahy Scale: Fair                             ADL either performed or assessed with clinical judgement    ADL       Grooming: Supervision/safety;Standing   Upper Body Bathing: Supervision/ safety;Sitting;Standing   Lower Body Bathing: Minimal assistance;Sit to/from stand   Upper Body Dressing : Supervision/safety;Standing;Sitting   Lower Body Dressing: Minimal assistance;Sit to/from stand   Toilet Transfer: Supervision/safety;Rolling walker (2 wheels)   Toileting- Clothing Manipulation and Hygiene: Supervision/safety;Sit to/from stand              Extremity/Trunk Assessment Upper Extremity Assessment Upper Extremity Assessment: Overall WFL for tasks assessed   Lower Extremity Assessment Lower Extremity Assessment: Defer to PT evaluation   Cervical / Trunk Assessment Cervical / Trunk Assessment: Normal    Vision Baseline Vision/History: 1 Wears glasses Patient Visual Report: No change from baseline     Perception Perception Perception: Not tested   Praxis Praxis Praxis: Not tested    Cognition Arousal/Alertness: Awake/alert Behavior During Therapy: WFL for tasks assessed/performed Overall Cognitive Status: Within Functional Limits for tasks assessed                                                       General Comments  HR 114    Pertinent Vitals/ Pain       Pain Assessment Pain Assessment: Faces Faces Pain Scale: Hurts a little bit Pain Location: rt thigh Pain Descriptors / Indicators: Burning Pain Intervention(s): Monitored during session  Frequency  Min 1X/week        Progress Toward Goals  OT Goals(current goals can now be found in the care plan section)  Progress towards OT goals: Progressing toward goals  Acute Rehab OT Goals OT Goal Formulation: With patient Time For Goal Achievement: 04/01/23 Potential to Achieve Goals: Good  Plan Discharge plan remains appropriate    Co-evaluation                 AM-PAC OT "6 Clicks"  Daily Activity     Outcome Measure   Help from another person eating meals?: None Help from another person taking care of personal grooming?: None Help from another person toileting, which includes using toliet, bedpan, or urinal?: A Little Help from another person bathing (including washing, rinsing, drying)?: A Little Help from another person to put on and taking off regular upper body clothing?: None Help from another person to put on and taking off regular lower body clothing?: A Little 6 Click Score: 21    End of Session Equipment Utilized During Treatment: Rolling walker (2 wheels)  OT Visit Diagnosis: Unsteadiness on feet (R26.81);Other abnormalities of gait and mobility (R26.89);Muscle weakness (generalized) (M62.81)   Activity Tolerance Patient tolerated treatment well   Patient Left in chair;with call bell/phone within reach   Nurse Communication          Time: 4540-9811 OT Time Calculation (min): 20 min  Charges: OT General Charges $OT Visit: 1 Visit OT Treatments $Self Care/Home Management : 8-22 mins  03/20/2023  RP, OTR/L  Acute Rehabilitation Services  Office:  [phone]   Suzanna Obey 03/20/2023, 10:07 AM

## 2023-03-21 LAB — GLUCOSE, CAPILLARY
Glucose-Capillary: 131 mg/dL — ABNORMAL HIGH (ref 70–99)
Glucose-Capillary: 129 mg/dL — ABNORMAL HIGH (ref 70–99)
Glucose-Capillary: 101 mg/dL — ABNORMAL HIGH (ref 70–99)
Glucose-Capillary: 114 mg/dL — ABNORMAL HIGH (ref 70–99)

## 2023-03-21 MED ORDER — FUROSEMIDE 10 MG/ML IJ SOLN
40.0000 mg | Freq: Once | INTRAMUSCULAR | Status: AC
  Administered 2023-03-21: 40 mg via INTRAVENOUS

## 2023-03-21 MED ORDER — FUROSEMIDE 10 MG/ML IJ SOLN
INTRAMUSCULAR | Status: AC
  Administered 2023-03-21: 40 mg
  Filled 2023-03-21: qty 4

## 2023-03-21 NOTE — Progress Notes (Addendum)
[facility] [place]  Subjective  - Doing better day by day   Objective (!) 154/86 85 98.2 F (36.8 C) (Oral) 17 98%  Intake/Output Summary (Last 24 hours) at 03/21/2023 0856 Last data filed at 03/21/2023 0700 Gross per 24 hour  Intake 120 ml  Output 2475 ml  Net -2355 ml    Doppler B PT are brisk Groins healing well Lungs non labored breathing Tolerated PO's without N/V/D  Assessment/Planning: 62 y.o. male is s/p  Aortobifemoral bypass grafting with 14 x 7 mm dacryon with aortic and bilateral common femoral endarterectomies and ligation of inferior mesenteric artery  4 Days Post-Op  Good doppler flow B PT Ambulating more and feeling stronger Tolerated PO intact last 24 hours without N/V SQ Heparin for DVT prophylaxis Continue to mobilize CIR verse Home   acute surgical blood loss anemia-received one unit PRBC yesterday.  Cr recovered WNL 1.07 Urine OP >2600 last 24 hours   Mosetta Pigeon 03/21/2023 8:56 AM --  VASCULAR STAFF ADDENDUM: I have independently interviewed and examined the patient. I agree with the above.  Overall doing well postop ABF Significant edema in bilateral lower extremities scrotum and penis-will continue to manage with diuresis. Tolerating a diet, no nausea no vomiting Belly soft, incisions dry Up out of bed as tolerated.  Scrotal sling for comfort PRN  Fara Olden, MD [facility] Phone Number: [phone] 03/21/2023 9:14 AM    Laboratory Lab Results: Recent Labs    03/19/23 1712 03/20/23 0354  WBC 8.1 7.4  HGB 9.5* 8.9*  HCT 28.1* 26.6*  PLT 105* 102*   BMET Recent Labs    03/19/23 1712 03/20/23 0354  NA 129* 129*  K 3.8 3.8  CL 98 101  CO2 21* 20*  GLUCOSE 111* 106*  BUN 18 15  CREATININE 1.34* 1.07  CALCIUM 7.2* 7.2*    COAG Lab Results  Component Value Date   INR 1.2 03/17/2023   INR 0.9 03/04/2023   INR 1.3 (H) 02/17/2020   No  results found for: "PTT"

## 2023-03-21 NOTE — Progress Notes (Signed)
Pt ambulated with front wheel walker x 250 feet in hall wit a steady gate.

## 2023-03-22 LAB — GLUCOSE, CAPILLARY
Glucose-Capillary: 138 mg/dL — ABNORMAL HIGH (ref 70–99)
Glucose-Capillary: 134 mg/dL — ABNORMAL HIGH (ref 70–99)
Glucose-Capillary: 90 mg/dL (ref 70–99)
Glucose-Capillary: 105 mg/dL — ABNORMAL HIGH (ref 70–99)

## 2023-03-22 LAB — BASIC METABOLIC PANEL
Anion gap: 10 (ref 5–15)
BUN: 10 mg/dL (ref 8–23)
CO2: 28 mmol/L (ref 22–32)
Calcium: 8.2 mg/dL — ABNORMAL LOW (ref 8.9–10.3)
Chloride: 95 mmol/L — ABNORMAL LOW (ref 98–111)
Creatinine, Ser: 1.06 mg/dL (ref 0.61–1.24)
GFR, Estimated: >60 mL/min (ref >60)
Glucose, Bld: 134 mg/dL — ABNORMAL HIGH (ref 70–99)
Potassium: 3.5 mmol/L (ref 3.5–5.1)
Sodium: 133 mmol/L — ABNORMAL LOW (ref 135–145)

## 2023-03-22 MED ORDER — POTASSIUM CHLORIDE CRYS ER 20 MEQ PO TBCR: 20.0000 meq | EXTENDED_RELEASE_TABLET | Freq: Once | ORAL | Status: AC

## 2023-03-22 MED ORDER — FUROSEMIDE 10 MG/ML IJ SOLN
40.0000 mg | Freq: Once | INTRAMUSCULAR | Status: AC
  Administered 2023-03-22: 40 mg via INTRAVENOUS
  Filled 2023-03-22: qty 4

## 2023-03-22 MED ORDER — POTASSIUM CHLORIDE 20 MEQ PO PACK
20.0000 meq | PACK | Freq: Once | ORAL | Status: AC | PRN
  Administered 2023-03-22: 40 meq
  Filled 2023-03-22: qty 2

## 2023-03-22 NOTE — Progress Notes (Signed)
Mobility Specialist Progress Note    03/22/23 1325  Mobility  Activity Ambulated with assistance in hallway  Level of Assistance Contact guard assist, steadying assist  Assistive Device Other (Comment) (HHA, hallway railings)  Distance Ambulated (ft) 400 ft  Activity Response Tolerated well  Mobility Referral Yes  $Mobility charge 1 Mobility  Mobility Specialist Start Time (ACUTE ONLY) 1417  Mobility Specialist Stop Time (ACUTE ONLY) 1423  Mobility Specialist Time Calculation (min) (ACUTE ONLY) 6 min   Pre-Mobility: 83 HR During Mobility: 121 HR Post-Mobility: 106 HR  Pt received in chair and agreeable. C/o balance being off. Returned to chair with call bell in reach.   [place] Mobility Specialist  Please Neurosurgeon or Rehab Office at [phone]

## 2023-03-22 NOTE — Progress Notes (Addendum)
[facility] [place]  Subjective  - Sitting up in chair still  complaining of scrotal edema as his biggest issue.  He is ambulating further and mor stable.     Objective (!) 176/87 78 97.8 F (36.6 C) (Oral) 19 96%  Intake/Output Summary (Last 24 hours) at 03/22/2023 0705 Last data filed at 03/22/2023 0349 Gross per 24 hour  Intake --  Output 2100 ml  Net -2100 ml   Doppler B PT are brisk Groins healing well Lungs non labored breathing Tolerated PO's without N/V/D   Assessment/Planning:  He has a history of short distance claudication and severe aortoiliac occlusive disease.   62 y.o. male is s/p  Aortobifemoral bypass grafting with 14 x 7 mm dacryon with aortic and bilateral common femoral endarterectomies and ligation of inferior mesenteric artery  4 Days Post-Op  He is progressing well with ambulation, tolerating PO's. He continues to have LE and scrotal swelling  Continue Diuresis with Lasix 40 mg every day.   Urine OP of > 2000 last 24 hours Continue to mobilize alternating with elevation of LE's. Pending discharge CIR verse home  I discussed elevation more with him.  He sits in the bedside chair all day.     Mosetta Pigeon 03/22/2023 7:05 AM --  VASCULAR STAFF ADDENDUM: I have independently interviewed and examined the patient. I agree with the above.  BMP pending, will replace potassium prior to 40 IV Lasix.  Diuresing well. I reiterated the importance of not sitting in the dependent position due to the significant swelling of his scrotum and penis. Discharge pending continue diuresis/CR/home  Fara Olden, MD [facility] Phone Number: [phone] 03/22/2023 9:31 AM    Laboratory Lab Results: Recent Labs    03/19/23 1712 03/20/23 0354  WBC 8.1 7.4  HGB 9.5* 8.9*  HCT 28.1* 26.6*  PLT 105* 102*   BMET Recent Labs    03/19/23 1712 03/20/23 0354  NA 129* 129*  K 3.8  3.8  CL 98 101  CO2 21* 20*  GLUCOSE 111* 106*  BUN 18 15  CREATININE 1.34* 1.07  CALCIUM 7.2* 7.2*    COAG Lab Results  Component Value Date   INR 1.2 03/17/2023   INR 0.9 03/04/2023   INR 1.3 (H) 02/17/2020   No results found for: "PTT"

## 2023-03-23 ENCOUNTER — Other Ambulatory Visit (HOSPITAL_COMMUNITY): Payer: Self-pay

## 2023-03-23 LAB — GLUCOSE, CAPILLARY: Glucose-Capillary: 99 mg/dL (ref 70–99)

## 2023-03-23 MED ORDER — OXYCODONE-ACETAMINOPHEN 5-325 MG PO TABS
1.0000 | ORAL_TABLET | Freq: Four times a day (QID) | ORAL | 0 refills | Status: DC | PRN
  Filled 2023-03-23: qty 30, 7d supply, fill #0

## 2023-03-23 NOTE — TOC Transition Note (Signed)
Transition of Care (TOC) - CM/SW Discharge Note Donn Pierini RN, BSN Transitions of Care Unit 4E- RN Case Manager See Treatment Team for direct phone #   Patient Details  Name: Karl Garcia MRN: [id number] Date of Birth: Oct 02, 1960  Transition of Care [facility]) CM/SW Contact:  Darrold Span, RN Phone Number: 03/23/2023, 10:13 AM   Clinical Narrative:    Pt stable for transition home today, CM Notified by [facility] liaison -following patient with MD office protocol referral prearranged for [facility] needs,  Spoke with pt at bedside- discussed HH needs and VVS referral to [facility]- pt voiced he has no preference and is ok with VVS office referral to [facility]. Pt states he has RW at home and no other DME needs at this time.   Friend to come provide transport home and is only 10 min. Away when pt is ready for ride.   CM notified [facility] liaison that pt is transitioning home today- anticipate initial visit tomorrow. .    Final next level of care: Home w Home Health Services Barriers to Discharge: Barriers Resolved   Patient Goals and CMS Choice CMS Medicare.gov Compare Post Acute Care list provided to:: Patient Choice offered to / list presented to : Patient  Discharge Placement               Home w/ [facility]          Discharge Plan and Services Additional resources added to the After Visit Summary for   In-house Referral: NA Discharge Planning Services: CM Consult Post Acute Care Choice: Home Health          DME Arranged: N/A DME Agency: NA       HH Arranged: PT HH Agency: [facility] Date [facility] Agency Contacted: 03/23/23 Time HH Agency Contacted: 1012 Representative spoke with at [facility] Agency: Amy  Social Determinants of Health (SDOH) Interventions SDOH Screenings   Food Insecurity: No Food Insecurity (03/19/2023)  Housing: Patient Declined (03/19/2023)  Transportation Needs: No Transportation Needs (03/19/2023)  Utilities: Not At Risk (03/19/2023)  Alcohol Screen:  High Risk (03/19/2023)  Depression (PHQ2-9): Low Risk  (03/19/2023)  Financial Resource Strain: Low Risk  (03/19/2023)  Physical Activity: Sufficiently Active (03/19/2023)  Social Connections: Moderately Isolated (03/19/2023)  Stress: No Stress Concern Present (03/19/2023)  Tobacco Use: High Risk (03/19/2023)  Health Literacy: Adequate Health Literacy (03/19/2023)     Readmission Risk Interventions    03/23/2023   10:13 AM  Readmission Risk Prevention Plan  Post Dischage Appt Complete  Medication Screening Complete  Transportation Screening Complete

## 2023-03-23 NOTE — Progress Notes (Addendum)
      Subjective  - Ready to go home.  Swelling of the scrotum/penis improved with elevation.   Objective 129/61 69 98.6 F (37 C) (Oral) 14 93%  Intake/Output Summary (Last 24 hours) at 03/23/2023 0720 Last data filed at 03/23/2023 0500 Gross per 24 hour  Intake 120 ml  Output 3150 ml  Net -3030 ml    Doppler B PT Groins healing well LE edema better with TED hose Lungs non labored breathing   Assessment/Planning: Aortobifemoral bypass grafting with 14 x 7 mm dacryon with aortic and bilateral common femoral endarterectomies and ligation of inferior mesenteric artery  8 Days Post-Op  Good urine OP diuresis -1,027 with improved edema in the lower half of the body. He has progressed well with PO intake, ambulation  He has no equipment needs PT recommending HH    Mosetta Pigeon 03/23/2023 7:20 AM --  Laboratory Lab Results: No results for input(s): "WBC", "HGB", "HCT", "PLT" in the last 72 hours. BMET Recent Labs    03/22/23 1143  NA 133*  K 3.5  CL 95*  CO2 28  GLUCOSE 134*  BUN 10  CREATININE 1.06  CALCIUM 8.2*    COAG Lab Results  Component Value Date   INR 1.2 03/17/2023   INR 0.9 03/04/2023   INR 1.3 (H) 02/17/2020   No results found for: "PTT"   Patient was discharged prior to my arrival to room. I reached him via telephone and discussed expected course and need for follow-up in a few weeks and he demonstrates good understanding.  He will call us if there are issues prior.   Yannick Steuber C. Randie Heinz, MD [facility] [place] Office: [phone] Pager: [phone]

## 2023-03-23 NOTE — Progress Notes (Signed)
 D/c tele and IV. Went over AVS with pt and all questions were addressed.   Lavenia Atlas, RN

## 2023-03-24 ENCOUNTER — Telehealth: Payer: Self-pay

## 2023-03-24 ENCOUNTER — Other Ambulatory Visit (HOSPITAL_COMMUNITY): Payer: Self-pay

## 2023-03-24 NOTE — Telephone Encounter (Signed)
Pt called stating that he lost his prescription for pain meds and wanted a refill.  Reviewed pt's chart, returned call for clarification, two identifiers used. Pt stated that he had it when he left the hospital, but couldn't find it when it was time to take another dose after getting home. Informed him that he would have to try to find it because a new prescription could not be sent in and the pharmacy wouldn't be able to legally fill it since it was filled yesterday. Confirmed understanding.

## 2023-03-24 NOTE — Telephone Encounter (Signed)
Karl Garcia with [facility] called stating that the pt has to pay a co-pay with each [facility] visit. They are delaying the start until Sat.  Reviewed pt's chart, returned call for clarification, no answer, lf vm.

## 2023-03-24 NOTE — Transitions of Care (Post Inpatient/ED Visit) (Signed)
03/24/2023  Name: Karl Garcia MRN: [id number] DOB: [birth year]  Today's TOC FU Call Status: Today's TOC FU Call Status:: Successful TOC FU Call Competed TOC FU Call Complete Date: 03/24/23  Transition Care Management Follow-up Telephone Call Date of Discharge: 03/23/23 Discharge Facility: [facility]) Type of Discharge: Inpatient Admission Primary Inpatient Discharge Diagnosis:: "PAD" How have you been since you were released from the hospital?:  (Pt requested brief call-states he is trying to rest-having some pain. He lost pain pills yest while unloading/unpacking from hosp stay-trying to find them. He has called surgeonMD office and made them aware.) Any questions or concerns?: Yes Patient Questions/Concerns:: Pt has already contacted surgeon ofice to advise them on issue with pain meds-since meds just filled yest pharmacy will not fill any this soon Patient Questions/Concerns Addressed: Other: (Pt enocuraged to try to re-trace his steps andall his locations from yest where pain med bottle may possibly be-denies anyone taking them-encouraged to follow up with office if unable to locate to see what other options available)  Items Reviewed: Did you receive and understand the discharge instructions provided?: Yes Medications obtained,verified, and reconciled?: No (pt declined-did not feel up to it) Any new allergies since your discharge?: No Dietary orders reviewed?: No Do you have support at home?: Yes  Medications Reviewed Today: Medications Reviewed Today     Reviewed by Nils Pyle, CRNA (Certified Registered Nurse Anesthetist) on 03/17/23 at [phone]  Med List Status: Complete   Medication Order Taking? Sig Documenting Provider Last Dose Status Informant  aspirin EC 81 MG tablet [id number] Yes Take 1 tablet (81 mg total) by mouth daily. Swallow whole. Pricilla Riffle, MD Past Week Active Self  atorvastatin (LIPITOR) 20 MG tablet [id number] Yes Take 1 tablet (20 mg total) by  mouth daily. Pricilla Riffle, MD Past Week Active Self  tamsulosin [facility]) 0.4 MG CAPS capsule [id number] Yes Take 1 capsule (0.4 mg total) by mouth daily. Joline Maxcy, MD Past Week Active             Home Care and Equipment/Supplies: Were Home Health Services Ordered?: Yes Name of Home Health Agency:: [phone] Has Agency set up a time to come to your home?: Yes First Home Health Visit Date: 03/28/23 Any new equipment or medical supplies ordered?: No  Functional Questionnaire: Do you need assistance with bathing/showering or dressing?:  (Pt declined to continue call and complete assessment)  Follow up appointments reviewed: PCP Follow-up appointment confirmed?: NA Specialist Hospital Follow-up appointment confirmed?: No Reason Specialist Follow-Up Not Confirmed: Patient has Specialist Provider Number and will Call for Appointment (office to call pt with appt info)  SDOH Interventions Today    Flowsheet Row Most Recent Value  SDOH Interventions   Food Insecurity Interventions Patient Declined  Transportation Interventions Patient Declined      TOC Interventions Today    Flowsheet Row Most Recent Value  TOC Interventions   TOC Interventions Discussed/Reviewed TOC Interventions Discussed, Post discharge activity limitations per provider, Post op wound/incision care, S/S of infection      Interventions Today    Flowsheet Row Most Recent Value  General Interventions   General Interventions Discussed/Reviewed General Interventions Discussed, Doctor Visits  Doctor Visits Discussed/Reviewed PCP, Doctor Visits Discussed, Specialist  PCP/Specialist Visits Compliance with follow-up visit  Education Interventions   Education Provided Provided Education  Provided Verbal Education On When to see the doctor, Medication, Other  [pain mgmt]  Pharmacy Interventions   Pharmacy Dicussed/Reviewed Pharmacy Topics Discussed  Antionette Fairy, RN,BSN,CCM [facility] Care  Management Care Management Community Coordinator Direct Phone: [phone] Toll Free: [phone] Fax: [phone]

## 2023-03-26 NOTE — Discharge Summary (Signed)
[facility] Discharge Summary   Patient ID:  Karl Garcia MRN: [id number] DOB/AGE: December 10, 1960 62 y.o.  Admit date: 03/17/2023 Discharge date: 03/23/23 Date of Surgery: 03/17/2023 Surgeon: Surgeon(s): Maeola Harman, MD Victorino Sparrow, MD  Admission Diagnosis: PAD (peripheral artery disease) [facility]) [I73.9] Aortoiliac occlusive disease (HCC) [I74.09]  Discharge Diagnoses:  PAD (peripheral artery disease) (HCC) [I73.9] Aortoiliac occlusive disease (HCC) [I74.09]  Secondary Diagnoses: Past Medical History:  Diagnosis Date   Coronary artery disease    S/p NSTEMI 02/2020 >> s/p CABG // Echocardiogram 6/21: EF 55-60, Gr 2 DD, LAE   Diabetes mellitus Type 2    Essential hypertension    ETOH abuse    Hx of NSTEMI in 02/2020    Hyperlipidemia    L subclavian stenosis    Peripheral Arterial Disease    Tobacco abuse     Procedure(s): AORTOBIFEMORAL BYPASS GRAFT USING X HEMASHIELD GOLD VASCULAR GRAFT, LIGATION OF INTERNAL MESENTERIC ARTERY  Discharged Condition: stable  HPI: Karl Garcia is a 62 y.o. male with history of bilateral lower extremity short distance claudication.  CTA shows high-grade aortoiliac and common femoral stenoses with bilateral lower extremity claudication that is life-limiting from a right lower extremity standpoint but also occurs on the left.  Plan for Aortobifemoral bypass today in OR.     Hospital Course:  Karl Garcia is a 62 y.o. male is S/P  Procedure(s): AORTOBIFEMORAL BYPASS GRAFT USING X HEMASHIELD GOLD VASCULAR GRAFT, LIGATION OF INTERNAL MESENTERIC ARTERY Throughout his hospitalization he maintained good inflow with doppler signals B.  His biggest complaint was scrotal/penis swelling.  He maintained good urine OP with lasix assistance.  He did require blood transfusions for blood loss anemia.  He was discharged in stable condition on 03/23/23.  HH PT was set up.     Significant Diagnostic  Studies: CBC Lab Results  Component Value Date   WBC 7.4 03/20/2023   HGB 8.9 (L) 03/20/2023   HCT 26.6 (L) 03/20/2023   MCV 99.6 03/20/2023   PLT 102 (L) 03/20/2023    BMET    Component Value Date/Time   NA 133 (L) 03/22/2023 1143   NA 141 06/27/2020 1649   K 3.5 03/22/2023 1143   CL 95 (L) 03/22/2023 1143   CO2 28 03/22/2023 1143   GLUCOSE 134 (H) 03/22/2023 1143   BUN 10 03/22/2023 1143   BUN 10 06/27/2020 1649   CREATININE 1.06 03/22/2023 1143   CREATININE 0.82 12/03/2022 1055   CALCIUM 8.2 (L) 03/22/2023 1143   GFRNONAA >60 03/22/2023 1143   GFRAA 120 06/27/2020 1649   COAG Lab Results  Component Value Date   INR 1.2 03/17/2023   INR 0.9 03/04/2023   INR 1.3 (H) 02/17/2020     Disposition:  Discharge to :Home Discharge Instructions     Call MD for:  redness, tenderness, or signs of infection (pain, swelling, bleeding, redness, odor or green/yellow discharge around incision site)   Complete by: As directed    Call MD for:  redness, tenderness, or signs of infection (pain, swelling, bleeding, redness, odor or green/yellow discharge around incision site)   Complete by: As directed    Call MD for:  severe or increased pain, loss or decreased feeling  in affected limb(s)   Complete by: As directed    Call MD for:  severe or increased pain, loss or decreased feeling  in affected limb(s)   Complete by: As directed  Call MD for:  temperature >100.5   Complete by: As directed    Call MD for:  temperature >100.5   Complete by: As directed    Discharge instructions   Complete by: As directed    Elevate your legs and scrotum multiple times a day.  Alternate with walking.  Wear ted hose for swelling control   Resume previous diet   Complete by: As directed    Resume previous diet   Complete by: As directed       Allergies as of 03/23/2023       Reactions   Morphine And Codeine Rash        Medication List     TAKE these medications    aspirin EC 81  MG tablet Take 1 tablet (81 mg total) by mouth daily. Swallow whole.   atorvastatin 20 MG tablet Commonly known as: LIPITOR Take 1 tablet (20 mg total) by mouth daily.   oxyCODONE-acetaminophen 5-325 MG tablet Commonly known as: PERCOCET/ROXICET Take 1 tablet by mouth every 6 (six) hours as needed for moderate pain.   tamsulosin 0.4 MG Caps capsule Commonly known as: FLOMAX Take 1 capsule (0.4 mg total) by mouth daily.       Verbal and written Discharge instructions given to the patient. Wound care per Discharge AVS  Follow-up Information     Maeola Harman, MD Follow up in 2 week(s).   Specialties: Vascular Surgery, Cardiology Why: Office will call you to arrange your appt (sent) Contact information: [address] [place] [address] [phone]         [facility] Follow up.   Why: HHPT- VVS office referral preop- they will contact you to schedule Contact information: [address] [place] [address] [phone]                 Signed: Mosetta Pigeon 03/26/2023, 11:35 AM - For VQI Registry use --- Instructions: Press F2 to tab through selections.  Delete question if not applicable.   Post-op:  Time to Extubation: [ ]  In OR, [ ]  < 12 hrs, [ ]  12-24 hrs, [ ]  >=24 hrs Vasopressors Req. Post-op: No MI: [ x] No, [ ]  Troponin only, [ ]  EKG or Clinical New Arrhythmia: No CHF: No ICU Stay: 3 days Transfusion: Yes  If yes, 1 units given  Complications: Resp failure: [x ] none, [ ]  Pneumonia, [ ]  Ventilator Chg in renal function: [ ]  none, [ ]  Inc. Cr > 0.5, [ ]  Temp. Dialysis, [ ]  Permanent dialysis Leg ischemia: [ x] No, [ ]  Yes, no Surgery needed, [ ]  Yes, Surgery needed, [ ]  Amputation Bowel ischemia: [x ] No, [ ]  Medical Rx, [ ]  Surgical Rx Wound complication: [ ]  No, [ ]  Superficial separation/infection, [ ]  Return to OR Return to OR: No  Return to OR for bleeding: No Stroke: [ x] None, [ ]  Minor, [  ] Major  Discharge medications: Statin use:  Yes ASA use:  Yes Plavix use:  No  for medical reason not indicated Beta blocker use:  No  for medical reason not indicated

## 2023-03-28 DIAGNOSIS — Z48812 Encounter for surgical aftercare following surgery on the circulatory system: Secondary | ICD-10-CM | POA: Diagnosis not present

## 2023-03-28 DIAGNOSIS — I2510 Atherosclerotic heart disease of native coronary artery without angina pectoris: Secondary | ICD-10-CM | POA: Diagnosis not present

## 2023-03-28 DIAGNOSIS — I739 Peripheral vascular disease, unspecified: Secondary | ICD-10-CM | POA: Diagnosis not present

## 2023-03-28 DIAGNOSIS — F17210 Nicotine dependence, cigarettes, uncomplicated: Secondary | ICD-10-CM | POA: Diagnosis not present

## 2023-03-28 DIAGNOSIS — I252 Old myocardial infarction: Secondary | ICD-10-CM | POA: Diagnosis not present

## 2023-03-28 DIAGNOSIS — E785 Hyperlipidemia, unspecified: Secondary | ICD-10-CM | POA: Diagnosis not present

## 2023-03-28 DIAGNOSIS — E119 Type 2 diabetes mellitus without complications: Secondary | ICD-10-CM | POA: Diagnosis not present

## 2023-03-28 DIAGNOSIS — I119 Hypertensive heart disease without heart failure: Secondary | ICD-10-CM | POA: Diagnosis not present

## 2023-03-28 DIAGNOSIS — I700 Atherosclerosis of aorta: Secondary | ICD-10-CM | POA: Diagnosis not present

## 2023-03-28 DIAGNOSIS — Z951 Presence of aortocoronary bypass graft: Secondary | ICD-10-CM | POA: Diagnosis not present

## 2023-04-02 ENCOUNTER — Telehealth: Payer: Self-pay

## 2023-04-02 DIAGNOSIS — I739 Peripheral vascular disease, unspecified: Secondary | ICD-10-CM

## 2023-04-02 NOTE — Progress Notes (Signed)
POST OPERATIVE OFFICE NOTE    CC:  F/u for surgery  HPI:  This is a 62 y.o. male who is s/p ABF bypass and bilateral CFA endarterectomies and ligation of IMA on 03/17/2023 by Dr. Randie Heinz.  He did well post operatively.  His biggest complaint post op was scrotal swelling.    Pt returns today for follow up.  Pt states he is having bilateral leg swelling.  He states that his scrotal swelling has improved.  He is having some pain over the suprapubic area and has some concerns about swelling around his groin incisions bilaterally.  He states that the pins and needles feeling he had in his feet prior to surgery is better but still has some numbness.  He is compliant with his asa and statin.  He states that the urologist put him on medicine and he gets up to pee every hour at night.  He states his appetite is not very good but he continues to have regular bowel movements.   He continues to smoke but says he has been thinking about quitting.     Allergies  Allergen Reactions   Morphine And Codeine Rash    Current Outpatient Medications  Medication Sig Dispense Refill   aspirin EC 81 MG tablet Take 1 tablet (81 mg total) by mouth daily. Swallow whole. 100 tablet 3   atorvastatin (LIPITOR) 20 MG tablet Take 1 tablet (20 mg total) by mouth daily. 90 tablet 3   oxyCODONE-acetaminophen (PERCOCET/ROXICET) 5-325 MG tablet Take 1 tablet by mouth every 6 (six) hours as needed for moderate pain. 30 tablet 0   tamsulosin (FLOMAX) 0.4 MG CAPS capsule Take 1 capsule (0.4 mg total) by mouth daily. 90 capsule 3   No current facility-administered medications for this visit.     ROS:  See HPI  Physical Exam:  Today's Vitals   04/03/23 1053  BP: (!) 186/82  Resp: 16  Temp: 98 F (36.7 C)  TempSrc: Temporal  SpO2: 91%  Weight: 186 lb (84.4 kg)  Height: 6' (1.829 m)  PainSc: 9    Body mass index is 25.23 kg/m.   Incision:  midline incision is healing; there is a a very small superficial dehiscence  of the distal midline incision.  No drainage was expressed.  Bilateral groins are also healing nicley.  Pt seen with Dr. Karin Lieu and felt that the proximal incisions bilaterally are seromas.  He does not have any drainage from these incisions.   Extremities:  BLE edema; brisk PT doppler flow bilaterally; scrotal swelling much improved.     ABI 04/03/2023: +-------+-----------+-----------+------------+------------+  ABI/TBIToday's ABIToday's TBIPrevious ABIPrevious TBI  +-------+-----------+-----------+------------+------------+  Right 0.69       0.48       0.43        0.43          +-------+-----------+-----------+------------+------------+  Left  1.11       0.65       0.88        0.57          +-------+-----------+-----------+------------+------------+    Assessment/Plan:  This is a 62 y.o. male who is s/p:  ABF bypass and bilateral CFA endarterectomies and ligation of IMA on 03/17/2023 by Dr. Randie Heinz  -pt with brisk doppler flow bilateral PT.  His ABI and toe pressures improved today -he does have BLE edema-he was measured and put in knee high 15-107mmHg compression.  Discussed with him to put them on in the morning before getting out of bed  and taking them off at night.  Discussed when he is not mobilizing to elevate his feet above his heart to help with the swelling.  He was also give lasix 20mg  po daily x 5 days and K-dur po daily x 5 days.  Dr. Karin Lieu discussed with to take in the morning time.  -his BP is elevated today.  He is not on antihypertensives.  I have asked that he follow up with his PCP next week for labs and post hospitalization check.   -Dr. Karin Lieu wants pt to return next Friday for re-check.   -I discussed with pt the importance of smoking cessation.  He states he has been thinking about quitting.  -continue asa/statin   Doreatha Massed, [facility] [phone]   Clinic MD:  Karin Lieu

## 2023-04-02 NOTE — Telephone Encounter (Signed)
Pt called requesting refill for Oxy.  Reviewed pt's chart, returned call for clarification, two identifiers used. Pt c/o of intermittent stabbing pain between his naval and his groin. He states he still has a lot of swelling at his groin, genitals, and legs. Pt denies any other symptoms. Pt has been elevating in his recliner chair. Instructed pt to properly elevate, take OTC pain meds, and scheduled pt for a post-op appt with ABI as directed on the surgical f/u protocol, as no staff msg was received.

## 2023-04-03 ENCOUNTER — Ambulatory Visit (INDEPENDENT_AMBULATORY_CARE_PROVIDER_SITE_OTHER): Payer: 59 | Admitting: Physician Assistant

## 2023-04-03 ENCOUNTER — Encounter: Payer: Self-pay | Admitting: Physician Assistant

## 2023-04-03 ENCOUNTER — Ambulatory Visit (HOSPITAL_COMMUNITY)
Admission: RE | Admit: 2023-04-03 | Discharge: 2023-04-03 | Disposition: A | Discharge status: Home / Self Care | Payer: 59 | Source: Ambulatory Visit | Attending: Vascular Surgery | Admitting: Vascular Surgery

## 2023-04-03 VITALS — BP 186/82 | Temp 98.0°F | Resp 16 | Ht 72.0 in | Wt 186.0 lb

## 2023-04-03 DIAGNOSIS — I739 Peripheral vascular disease, unspecified: Secondary | ICD-10-CM | POA: Diagnosis not present

## 2023-04-03 DIAGNOSIS — Z95828 Presence of other vascular implants and grafts: Secondary | ICD-10-CM

## 2023-04-03 DIAGNOSIS — F17200 Nicotine dependence, unspecified, uncomplicated: Secondary | ICD-10-CM

## 2023-04-03 LAB — VAS US ABI WITH/WO TBI
Left ABI: 1.11
Right ABI: 0.69

## 2023-04-03 MED ORDER — FUROSEMIDE 20 MG PO TABS: 20.0000 mg | ORAL_TABLET | Freq: Every day | ORAL | 0 refills | Status: DC

## 2023-04-03 MED ORDER — POTASSIUM CHLORIDE CRYS ER 10 MEQ PO TBCR: 10.0000 meq | EXTENDED_RELEASE_TABLET | Freq: Every day | ORAL | 0 refills | Status: DC

## 2023-04-04 ENCOUNTER — Other Ambulatory Visit: Payer: Self-pay | Admitting: Physician Assistant

## 2023-04-06 ENCOUNTER — Encounter: Payer: Self-pay | Admitting: Nurse Practitioner

## 2023-04-06 ENCOUNTER — Ambulatory Visit (INDEPENDENT_AMBULATORY_CARE_PROVIDER_SITE_OTHER): Payer: 59 | Admitting: Nurse Practitioner

## 2023-04-06 VITALS — BP 188/90 | HR 89 | Temp 97.5°F | Ht 72.0 in | Wt 182.0 lb

## 2023-04-06 DIAGNOSIS — D649 Anemia, unspecified: Secondary | ICD-10-CM

## 2023-04-06 DIAGNOSIS — I10 Essential (primary) hypertension: Secondary | ICD-10-CM

## 2023-04-06 DIAGNOSIS — F1010 Alcohol abuse, uncomplicated: Secondary | ICD-10-CM

## 2023-04-06 DIAGNOSIS — Z720 Tobacco use: Secondary | ICD-10-CM | POA: Diagnosis not present

## 2023-04-06 DIAGNOSIS — E871 Hypo-osmolality and hyponatremia: Secondary | ICD-10-CM

## 2023-04-06 DIAGNOSIS — I739 Peripheral vascular disease, unspecified: Secondary | ICD-10-CM

## 2023-04-06 MED ORDER — TELMISARTAN 20 MG PO TABS
20.0000 mg | ORAL_TABLET | Freq: Every day | ORAL | 0 refills | Status: DC
Start: 2023-04-06 — End: 2023-04-23

## 2023-04-06 NOTE — Progress Notes (Signed)
Careteam: Patient Care Team: Sharon Seller, NP as PCP - General (Geriatric Medicine) Pricilla Riffle, MD as PCP - Cardiology (Cardiology) Bridgett Larsson, LCSW as Social Worker (Licensed Clinical Social Worker)  PLACE OF SERVICE:  [facility]  Advanced Directive information    Allergies  Allergen Reactions   Morphine And Codeine Rash    Chief Complaint  Patient presents with   Acute Visit    Elevated blood pressure (can only use right arm) and swelling in legs. Discuss refill on oxycodone or another pain medication for abdominal pain . Patient denies headache, dizziness, or blurred vision.      HPI: Patient is a 62 y.o. male for follow up.  He had aortobifemoral bypass reports he continues to have pain.  Feels like he has an ice pick poking in his groin.  Overall is improving with time but still painful.  Had follow up with surgery 3 days ago.   Blood pressure has been elevated. No headache, chest pains, palpitations, shortness of breath, blurred vision  Cutting back on smoking and alcohol use.   He has been taking lasix daily with potassium due to swelling in bilateral legs. This has improved.   Review of Systems:  Review of Systems  Constitutional:  Negative for chills, fever and weight loss.  HENT:  Negative for tinnitus.   Respiratory:  Negative for cough, sputum production and shortness of breath.   Cardiovascular:  Negative for chest pain, palpitations and leg swelling.  Gastrointestinal:  Negative for abdominal pain, constipation, diarrhea and heartburn.       Groin pain  Genitourinary:  Negative for dysuria, frequency and urgency.  Musculoskeletal:  Negative for back pain, falls, joint pain and myalgias.  Skin: Negative.   Neurological:  Negative for dizziness and headaches.  Psychiatric/Behavioral:  Negative for depression and memory loss. The patient does not have insomnia.     Past Medical History:  Diagnosis Date   Coronary artery disease     S/p NSTEMI 02/2020 >> s/p CABG // Echocardiogram 6/21: EF 55-60, Gr 2 DD, LAE   Diabetes mellitus Type 2    Essential hypertension    ETOH abuse    Hx of NSTEMI in 02/2020    Hyperlipidemia    L subclavian stenosis    Peripheral Arterial Disease    Tobacco abuse    Past Surgical History:  Procedure Laterality Date   ABDOMINAL AORTOGRAM W/LOWER EXTREMITY Bilateral 07/16/2020   Procedure: ABDOMINAL AORTOGRAM W/LOWER EXTREMITY;  Surgeon: Runell Gess, MD;  [facility]: [facility];  Service: Cardiovascular;  Laterality: Bilateral;   AORTA - BILATERAL FEMORAL ARTERY BYPASS GRAFT N/A 03/17/2023   Procedure: AORTOBIFEMORAL BYPASS GRAFT USING X HEMASHIELD GOLD VASCULAR GRAFT, LIGATION OF INTERNAL MESENTERIC ARTERY;  Surgeon: Maeola Harman, MD;  Location: [facility] OR;  Service: Vascular;  Laterality: N/A;   CORONARY ARTERY BYPASS GRAFT N/A 02/17/2020   Procedure: CORONARY ARTERY BYPASS GRAFTING (CABG) x Three, using left internal mammry artery and right leg greater saphenous vein harvested endoscopically;  Surgeon: Kerin Perna, MD;  Location: [facility] OR;  Service: Open Heart Surgery;  Laterality: N/A;   LEFT HEART CATH AND CORONARY ANGIOGRAPHY N/A 02/16/2020   Procedure: LEFT HEART CATH AND CORONARY ANGIOGRAPHY;  Surgeon: Runell Gess, MD;  Location: [facility];  Service: Cardiovascular;  Laterality: N/A;   TEE WITHOUT CARDIOVERSION N/A 02/17/2020   Procedure: TRANSESOPHAGEAL ECHOCARDIOGRAM (TEE);  Surgeon: Donata Clay, Theron Arista, MD;  Location: [facility]  OR;  Service: Open Heart Surgery;  Laterality: N/A;   TONSILECTOMY, ADENOIDECTOMY, BILATERAL MYRINGOTOMY AND TUBES     unsure of all of that but + tonsilectomy   Social History:   reports that he has been smoking cigarettes. He has a 25 pack-year smoking history. He has never used smokeless tobacco. He reports current alcohol use. He reports that he does not use drugs.  Family History  Problem Relation Age of Onset   Heart  attack Mother    Heart disease Father    Heart attack Father    Heart failure Father    Aneurysm Father    Healthy Brother    Heart disease Paternal Uncle     Medications: Patient's Medications  New Prescriptions   No medications on file  Previous Medications   ASPIRIN EC 81 MG TABLET    Take 1 tablet (81 mg total) by mouth daily. Swallow whole.   ATORVASTATIN (LIPITOR) 20 MG TABLET    Take 1 tablet (20 mg total) by mouth daily.   FUROSEMIDE (LASIX) 20 MG TABLET    Take 1 tablet (20 mg total) by mouth daily.   OXYCODONE-ACETAMINOPHEN (PERCOCET/ROXICET) 5-325 MG TABLET    Take 1 tablet by mouth every 6 (six) hours as needed for moderate pain.   POTASSIUM CHLORIDE (KLOR-CON M) 10 MEQ TABLET    Take 1 tablet (10 mEq total) by mouth daily.   TAMSULOSIN (FLOMAX) 0.4 MG CAPS CAPSULE    Take 1 capsule (0.4 mg total) by mouth daily.  Modified Medications   No medications on file  Discontinued Medications   No medications on file    Physical Exam:  Vitals:   04/06/23 0909  BP: (!) 184/92  Pulse: 89  Temp: (!) 97.5 F (36.4 C)  TempSrc: Temporal  SpO2: 96%  Weight: 182 lb (82.6 kg)  Height: 6' (1.829 m)   Body mass index is 24.68 kg/m. Wt Readings from Last 3 Encounters:  04/06/23 182 lb (82.6 kg)  04/03/23 186 lb (84.4 kg)  03/18/23 183 lb 13.8 oz (83.4 kg)    Physical Exam Constitutional:      General: He is not in acute distress.    Appearance: He is well-developed. He is not diaphoretic.  HENT:     Head: Normocephalic and atraumatic.     Right Ear: External ear normal.     Left Ear: External ear normal.     Mouth/Throat:     Pharynx: No oropharyngeal exudate.  Eyes:     Conjunctiva/sclera: Conjunctivae normal.     Pupils: Pupils are equal, round, and reactive to light.  Cardiovascular:     Rate and Rhythm: Normal rate and regular rhythm.     Heart sounds: Normal heart sounds.  Pulmonary:     Effort: Pulmonary effort is normal.     Breath sounds: Normal  breath sounds.  Abdominal:     General: Bowel sounds are normal.     Palpations: Abdomen is soft.  Musculoskeletal:        General: No tenderness.     Cervical back: Normal range of motion and neck supple.     Right lower leg: No edema.     Left lower leg: No edema.  Skin:    General: Skin is warm and dry.  Neurological:     Mental Status: He is alert and oriented to person, place, and time.     Labs reviewed: Basic Metabolic Panel: Recent Labs    03/17/23 1300 03/18/23 0406  03/19/23 0405 03/19/23 1712 03/20/23 0354 03/22/23 1143  NA 138 137   < > 129* 129* 133*  K 4.5 4.1   < > 3.8 3.8 3.5  CL 103 105   < > 98 101 95*  CO2 20* 23   < > 21* 20* 28  GLUCOSE 189* 113*   < > 111* 106* 134*  BUN 9 15   < > 18 15 10   CREATININE 0.93 1.16   < > 1.34* 1.07 1.06  CALCIUM 7.5* 7.3*   < > 7.2* 7.2* 8.2*  MG 1.3* 1.8  --   --   --   --    < > = values in this interval not displayed.   Liver Function Tests: Recent Labs    12/03/22 1055 03/04/23 0830 03/18/23 0406  AST 21 29 23   ALT 15 19 13   ALKPHOS  --  83 41  BILITOT 0.5 0.7 0.6  PROT 6.6 7.2 5.2*  ALBUMIN  --  3.6 3.1*   Recent Labs    03/18/23 0406  AMYLASE 21*   No results for input(s): "AMMONIA" in the last 8760 hours. CBC: Recent Labs    12/03/22 1055 03/04/23 0830 03/19/23 0405 03/19/23 1712 03/20/23 0354  WBC 5.4   < > 7.4 8.1 7.4  NEUTROABS 3,629  --   --   --   --   HGB 14.5   < > 8.3* 9.5* 8.9*  HCT 41.7   < > 24.9* 28.1* 26.6*  MCV 99.5   < > 102.0* 99.6 99.6  PLT 139*   < > 103* 105* 102*   < > = values in this interval not displayed.   Lipid Panel: Recent Labs    12/03/22 1055 03/20/23 0354  CHOL 183 108  HDL 51 41  LDLCALC 109* 50  TRIG 118 87  CHOLHDL 3.6 2.6   TSH: No results for input(s): "TSH" in the last 8760 hours. A1C: Lab Results  Component Value Date   HGBA1C 6.2 (H) 03/04/2023     Assessment/Plan 1. Peripheral arterial disease (HCC) -s/p aortobifemoral bypass  graft. Has had follow up with vascular, pain in legs much better. Continue to have some discomfort at site but improving daily -can use tylenol 650 mg by mouth every 6 hours as needed for apin  2. Anemia, unspecified type -will follow up labs - Complete Metabolic Panel with eGFR - CBC with Differential/Platelet  3. Primary hypertension -not controlled,  Will add telmisartan 20 mg by mouth daily, continue to work on dietary modifications  - Complete Metabolic Panel with eGFR - CBC with Differential/Platelet - telmisartan (MICARDIS) 20 MG tablet; Take 1 tablet (20 mg total) by mouth daily.  Dispense: 30 tablet; Refill: 0 Goal bp <140/90 ideally <130/80  4. Hyponatremia -will follow up lab today  5. Tobacco abuse To continue to work on cutting back, cessation encouraged  6. Alcohol abuse To continue to work on cutting back, cessation encoruated  Return in about 2 weeks (around 04/20/2023) for blood pressure check.  Janene Harvey. Biag[facility] [phone]

## 2023-04-06 NOTE — Patient Instructions (Addendum)
Tylenol 650 mg by mouth every 6 hours as needed for pain.    Start telmisartan once daily in the morning- this is for your HIGH blood pressure  To check blood pressure 1 hour AFTER you have had your medication Make sure you have been sitting at least 5 mins  Record and let us know.  Goal <140/90

## 2023-04-07 DIAGNOSIS — Z951 Presence of aortocoronary bypass graft: Secondary | ICD-10-CM | POA: Diagnosis not present

## 2023-04-07 DIAGNOSIS — E785 Hyperlipidemia, unspecified: Secondary | ICD-10-CM | POA: Diagnosis not present

## 2023-04-07 DIAGNOSIS — F17210 Nicotine dependence, cigarettes, uncomplicated: Secondary | ICD-10-CM | POA: Diagnosis not present

## 2023-04-07 DIAGNOSIS — I2510 Atherosclerotic heart disease of native coronary artery without angina pectoris: Secondary | ICD-10-CM | POA: Diagnosis not present

## 2023-04-07 DIAGNOSIS — I119 Hypertensive heart disease without heart failure: Secondary | ICD-10-CM | POA: Diagnosis not present

## 2023-04-07 DIAGNOSIS — E119 Type 2 diabetes mellitus without complications: Secondary | ICD-10-CM | POA: Diagnosis not present

## 2023-04-07 DIAGNOSIS — I252 Old myocardial infarction: Secondary | ICD-10-CM | POA: Diagnosis not present

## 2023-04-07 DIAGNOSIS — I739 Peripheral vascular disease, unspecified: Secondary | ICD-10-CM | POA: Diagnosis not present

## 2023-04-07 DIAGNOSIS — I700 Atherosclerosis of aorta: Secondary | ICD-10-CM | POA: Diagnosis not present

## 2023-04-07 DIAGNOSIS — Z48812 Encounter for surgical aftercare following surgery on the circulatory system: Secondary | ICD-10-CM | POA: Diagnosis not present

## 2023-04-10 ENCOUNTER — Ambulatory Visit (INDEPENDENT_AMBULATORY_CARE_PROVIDER_SITE_OTHER): Payer: 59 | Admitting: Physician Assistant

## 2023-04-10 VITALS — BP 151/75 | HR 73 | Temp 98.6°F | Resp 18 | Ht 72.0 in | Wt 178.6 lb

## 2023-04-10 DIAGNOSIS — F17200 Nicotine dependence, unspecified, uncomplicated: Secondary | ICD-10-CM

## 2023-04-10 DIAGNOSIS — I739 Peripheral vascular disease, unspecified: Secondary | ICD-10-CM

## 2023-04-10 DIAGNOSIS — Z95828 Presence of other vascular implants and grafts: Secondary | ICD-10-CM

## 2023-04-10 MED ORDER — FUROSEMIDE 20 MG PO TABS: 20.0000 mg | ORAL_TABLET | Freq: Every day | ORAL | 0 refills | Status: DC

## 2023-04-10 MED ORDER — ASPIRIN 81 MG PO TBEC: 81.0000 mg | DELAYED_RELEASE_TABLET | Freq: Every day | ORAL | 12 refills | Status: AC

## 2023-04-10 NOTE — Progress Notes (Signed)
Office Note     CC:  follow up Requesting Provider:  Sharon Seller, NP  HPI: Karl Garcia is a 62 y.o. ([birth year]) male who presents for post op follow up. He is s/p ABF bypass and bilateral common femoral endarterectomies with ligation of the IMA on 03/17/23 by Dr. Randie Heinz and Dr. Karin Lieu. He has progressed well post operatively. At his visit last week he was having a lot of swelling in his legs. Scrotal swelling improving. He took the Lasix as prescribed and has been wearing the compression stockings with improvement. He feels he has not had to urinate any more than usual with taking the lasix. Otherwise says pain has improved a lot. Only having a little suprapubic discomfort. Continues to have numbness and tingling in the right foot but this is slowly improving. Medically managed on Aspirin and Statin but says he is not taking the Aspirin because he ran out.   Past Medical History:  Diagnosis Date   Coronary artery disease    S/p NSTEMI 02/2020 >> s/p CABG // Echocardiogram 6/21: EF 55-60, Gr 2 DD, LAE   Diabetes mellitus Type 2    Essential hypertension    ETOH abuse    Hx of NSTEMI in 02/2020    Hyperlipidemia    L subclavian stenosis    Peripheral Arterial Disease    Tobacco abuse     Past Surgical History:  Procedure Laterality Date   ABDOMINAL AORTOGRAM W/LOWER EXTREMITY Bilateral 07/16/2020   Procedure: ABDOMINAL AORTOGRAM W/LOWER EXTREMITY;  Surgeon: Runell Gess, MD;  Location: [facility];  Service: Cardiovascular;  Laterality: Bilateral;   AORTA - BILATERAL FEMORAL ARTERY BYPASS GRAFT N/A 03/17/2023   Procedure: AORTOBIFEMORAL BYPASS GRAFT USING X HEMASHIELD GOLD VASCULAR GRAFT, LIGATION OF INTERNAL MESENTERIC ARTERY;  Surgeon: Maeola Harman, MD;  Location: [facility] OR;  Service: Vascular;  Laterality: N/A;   CORONARY ARTERY BYPASS GRAFT N/A 02/17/2020   Procedure: CORONARY ARTERY BYPASS GRAFTING (CABG) x Three, using left internal mammry artery  and right leg greater saphenous vein harvested endoscopically;  Surgeon: Kerin Perna, MD;  Location: [facility] OR;  Service: Open Heart Surgery;  Laterality: N/A;   LEFT HEART CATH AND CORONARY ANGIOGRAPHY N/A 02/16/2020   Procedure: LEFT HEART CATH AND CORONARY ANGIOGRAPHY;  Surgeon: Runell Gess, MD;  Location: [facility];  Service: Cardiovascular;  Laterality: N/A;   TEE WITHOUT CARDIOVERSION N/A 02/17/2020   Procedure: TRANSESOPHAGEAL ECHOCARDIOGRAM (TEE);  Surgeon: Donata Clay, Theron Arista, MD;  Location: [facility] OR;  Service: Open Heart Surgery;  Laterality: N/A;   TONSILECTOMY, ADENOIDECTOMY, BILATERAL MYRINGOTOMY AND TUBES     unsure of all of that but + tonsilectomy    Social History   Socioeconomic History   Marital status: Single    Spouse name: Not on file   Number of children: Not on file   Years of education: Not on file   Highest education level: Not on file  Occupational History   Occupation: Unemployed for about 6 months now    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  Tobacco Use   Smoking status: Every Day    Current packs/day: 0.50    Average packs/day: 0.5 packs/day for 50.0 years (25.0 ttl pk-yrs)    Types: Cigarettes   Smokeless tobacco: Never   Tobacco comments:    Discussed with patient he is interested but not committed at this time  Vaping Use   Vaping status: Never Used  Substance  and Sexual Activity   Alcohol use: Yes    Comment: a couple of drinks a day   Drug use: Never   Sexual activity: Yes  Other Topics Concern   Not on file  Social History Narrative   Tobacco use, amount per day now: 1/2 pack    Past tobacco use, amount per day: 1 pack    How many years did you use tobacco: 50 years    Alcohol use (drinks per week): 3-4 drinks    Diet: healthy    Do you drink/eat things with caffeine: yes, coffee   Marital status:  single                                What year were you married? N/A   Do you live in a house, apartment, assisted living,  condo, trailer, etc.? Currently homeless   Is it one or more stories? N/A   How many persons live in your home? N/A   Do you have pets in your home?( please list) N/A   Highest Level of education completed? Some college, Public relations account executive.    Current or past profession: Land   Do you exercise?   Yes, No                               Type and how often? Walking everyday   Do you have a living will? No   Do you have a DNR form?   Yes                                If not, do you want to discuss one?   Do you have signed POA/HPOA forms? No                        If so, please bring to you appointment      Do you have any difficulty bathing or dressing yourself? No   Do you have any difficulty preparing food or eating? No   Do you have any difficulty managing your medications? No   Do you have any difficulty managing your finances? Yes   Do you have any difficulty affording your medications?  No   Social Determinants of Health   Financial Resource Strain: Low Risk  (03/19/2023)   Overall Financial Resource Strain (CARDIA)    Difficulty of Paying Living Expenses: Not hard at all  Food Insecurity: Patient Declined (03/24/2023)   Hunger Vital Sign    Worried About Running Out of Food in the Last Year: Patient declined    Ran Out of Food in the Last Year: Patient declined  Transportation Needs: Patient Declined (03/24/2023)   PRAPARE - Administrator, Civil Service (Medical): Patient declined    Lack of Transportation (Non-Medical): Patient declined  Physical Activity: Sufficiently Active (03/19/2023)   Exercise Vital Sign    Days of Exercise per Week: 7 days    Minutes of Exercise per Session: 60 min  Stress: No Stress Concern Present (03/19/2023)   [employer] of Occupational Health - Occupational Stress Questionnaire    Feeling of Stress : Not at all  Social Connections: Moderately Isolated (03/19/2023)   Social Connection and Isolation Panel [NHANES]    Frequency of  Communication with Friends and Family: More  than three times a week    Frequency of Social Gatherings with Friends and Family: More than three times a week    Attends Religious Services: Never    Database administrator or Organizations: No    Attends Banker Meetings: Never    Marital Status: Living with partner  Intimate Partner Violence: Not At Risk (03/19/2023)   Humiliation, Afraid, Rape, and Kick questionnaire    Fear of Current or Ex-Partner: No    Emotionally Abused: No    Physically Abused: No    Sexually Abused: No    Family History  Problem Relation Age of Onset   Heart attack Mother    Heart disease Father    Heart attack Father    Heart failure Father    Aneurysm Father    Healthy Brother    Heart disease Paternal Uncle     Current Outpatient Medications  Medication Sig Dispense Refill   atorvastatin (LIPITOR) 20 MG tablet Take 1 tablet (20 mg total) by mouth daily. 90 tablet 3   furosemide (LASIX) 20 MG tablet Take 1 tablet (20 mg total) by mouth daily. 5 tablet 0   oxyCODONE-acetaminophen (PERCOCET/ROXICET) 5-325 MG tablet Take 1 tablet by mouth every 6 (six) hours as needed for moderate pain. 30 tablet 0   potassium chloride (KLOR-CON M) 10 MEQ tablet Take 1 tablet (10 mEq total) by mouth daily. 5 tablet 0   tamsulosin (FLOMAX) 0.4 MG CAPS capsule Take 1 capsule (0.4 mg total) by mouth daily. 90 capsule 3   telmisartan (MICARDIS) 20 MG tablet Take 1 tablet (20 mg total) by mouth daily. 30 tablet 0   aspirin EC 81 MG tablet Take 1 tablet (81 mg total) by mouth daily. Swallow whole. (Patient not taking: Reported on 04/10/2023) 100 tablet 3   No current facility-administered medications for this visit.    Allergies  Allergen Reactions   Morphine And Codeine Rash     REVIEW OF SYSTEMS:  [X]  denotes positive finding, [ ]  denotes negative finding Cardiac  Comments:  Chest pain or chest pressure:    Shortness of breath upon exertion:    Short of  breath when lying flat:    Irregular heart rhythm:        Vascular    Pain in calf, thigh, or hip brought on by ambulation:    Pain in feet at night that wakes you up from your sleep:     Blood clot in your veins:    Leg swelling:         Pulmonary    Oxygen at home:    Productive cough:     Wheezing:         Neurologic    Sudden weakness in arms or legs:     Sudden numbness in arms or legs:     Sudden onset of difficulty speaking or slurred speech:    Temporary loss of vision in one eye:     Problems with dizziness:         Gastrointestinal    Blood in stool:     Vomited blood:         Genitourinary    Burning when urinating:     Blood in urine:        Psychiatric    Major depression:         Hematologic    Bleeding problems:    Problems with blood clotting too easily:  Skin    Rashes or ulcers:        Constitutional    Fever or chills:      PHYSICAL EXAMINATION:  Vitals:   04/10/23 1107  BP: (!) 151/75  Pulse: 73  Resp: 18  Temp: 98.6 F (37 C)  TempSrc: Temporal  SpO2: 94%  Weight: 178 lb 9.6 oz (81 kg)  Height: 6' (1.829 m)    General:  WDWN in NAD; vital signs documented above Gait: Normal HENT: WNL, normocephalic Pulmonary: normal non-labored breathing without wheezing Cardiac: regular HR Abdomen: soft, NT, laparotomy incision is healing well, small area of separation on distal aspect of incision; scrotal swelling resolved Vascular Exam/Pulses: bilateral groin incisions are intact and healing well, left groin with small seroma that is resolving. Palpable femoral pulses. Soft without hematoma Extremities: without ischemic changes, without Gangrene , without cellulitis; without open wounds;  Musculoskeletal: no muscle wasting or atrophy  Neurologic: A&O X 3 Psychiatric:  The pt has  affect.   ASSESSMENT/PLAN:: 62 y.o. male here for follow up post op ABF bypass with bilateral CF endarterectomies and ligation of the IMA on 03/17/23 by Dr.  Randie Heinz.Incisions are all healing very well. Has small seroma in left groin that is resolving. Continues to have bilateral edema but much improved since last week with Lasix and compression stockings. BLE well perfused with palpable PT pulses bilaterally - Encourage continued elevation of his legs - Will send another 3 days of Lasix 20 mg to his pharmacy - He will follow up in 2 weeks to recheck incisions and check lower extremity swelling   Graceann Congress, PA-C [facility] [phone]  Clinic MD:   Karin Lieu

## 2023-04-13 ENCOUNTER — Other Ambulatory Visit: Payer: Self-pay

## 2023-04-13 DIAGNOSIS — R972 Elevated prostate specific antigen [PSA]: Secondary | ICD-10-CM

## 2023-04-14 ENCOUNTER — Ambulatory Visit: Payer: 59 | Admitting: Urology

## 2023-04-14 ENCOUNTER — Ambulatory Visit (HOSPITAL_BASED_OUTPATIENT_CLINIC_OR_DEPARTMENT_OTHER)
Admission: RE | Admit: 2023-04-14 | Discharge: 2023-04-14 | Disposition: A | Discharge status: Home / Self Care | Payer: 59 | Source: Ambulatory Visit | Attending: Urology | Admitting: Urology

## 2023-04-14 ENCOUNTER — Telehealth: Payer: Self-pay

## 2023-04-14 ENCOUNTER — Other Ambulatory Visit (HOSPITAL_BASED_OUTPATIENT_CLINIC_OR_DEPARTMENT_OTHER): Payer: Self-pay | Admitting: Urology

## 2023-04-14 DIAGNOSIS — R972 Elevated prostate specific antigen [PSA]: Secondary | ICD-10-CM

## 2023-04-14 NOTE — Telephone Encounter (Signed)
Patient presented today for a prostate biopsy. His vitals were as below:   BP: 217/108   HR: 87  Due to the BP elevation, patient was rescheduled for the procedure. Patient was advised to go home and take his BP meds which he did not take this morning.

## 2023-04-20 ENCOUNTER — Encounter: Payer: 59 | Admitting: Family

## 2023-04-20 ENCOUNTER — Encounter: Payer: Self-pay | Admitting: Family

## 2023-04-20 NOTE — Progress Notes (Signed)
  This encounter was created in error - please disregard. No show 

## 2023-04-21 ENCOUNTER — Emergency Department (HOSPITAL_BASED_OUTPATIENT_CLINIC_OR_DEPARTMENT_OTHER): Payer: 59

## 2023-04-21 ENCOUNTER — Ambulatory Visit (HOSPITAL_BASED_OUTPATIENT_CLINIC_OR_DEPARTMENT_OTHER)
Admission: RE | Admit: 2023-04-21 | Discharge: 2023-04-21 | Disposition: A | Discharge status: Home / Self Care | Payer: 59 | Source: Ambulatory Visit | Attending: Urology | Admitting: Urology

## 2023-04-21 ENCOUNTER — Emergency Department (HOSPITAL_BASED_OUTPATIENT_CLINIC_OR_DEPARTMENT_OTHER)
Admission: EM | Admit: 2023-04-21 | Discharge: 2023-04-21 | Disposition: A | Discharge status: Home / Self Care | Payer: 59 | Attending: Emergency Medicine | Admitting: Emergency Medicine

## 2023-04-21 ENCOUNTER — Encounter (HOSPITAL_BASED_OUTPATIENT_CLINIC_OR_DEPARTMENT_OTHER): Payer: Self-pay | Admitting: Emergency Medicine

## 2023-04-21 ENCOUNTER — Other Ambulatory Visit (HOSPITAL_BASED_OUTPATIENT_CLINIC_OR_DEPARTMENT_OTHER): Payer: Self-pay | Admitting: Urology

## 2023-04-21 ENCOUNTER — Ambulatory Visit: Payer: 59 | Admitting: Urology

## 2023-04-21 ENCOUNTER — Other Ambulatory Visit: Payer: Self-pay

## 2023-04-21 VITALS — BP 214/90

## 2023-04-21 DIAGNOSIS — R972 Elevated prostate specific antigen [PSA]: Secondary | ICD-10-CM

## 2023-04-21 DIAGNOSIS — E119 Type 2 diabetes mellitus without complications: Secondary | ICD-10-CM | POA: Insufficient documentation

## 2023-04-21 DIAGNOSIS — I2510 Atherosclerotic heart disease of native coronary artery without angina pectoris: Secondary | ICD-10-CM | POA: Insufficient documentation

## 2023-04-21 DIAGNOSIS — Z87891 Personal history of nicotine dependence: Secondary | ICD-10-CM | POA: Insufficient documentation

## 2023-04-21 DIAGNOSIS — Z7982 Long term (current) use of aspirin: Secondary | ICD-10-CM | POA: Insufficient documentation

## 2023-04-21 DIAGNOSIS — Z79899 Other long term (current) drug therapy: Secondary | ICD-10-CM | POA: Insufficient documentation

## 2023-04-21 DIAGNOSIS — Z951 Presence of aortocoronary bypass graft: Secondary | ICD-10-CM | POA: Diagnosis not present

## 2023-04-21 DIAGNOSIS — R030 Elevated blood-pressure reading, without diagnosis of hypertension: Secondary | ICD-10-CM | POA: Diagnosis not present

## 2023-04-21 DIAGNOSIS — I10 Essential (primary) hypertension: Secondary | ICD-10-CM | POA: Diagnosis not present

## 2023-04-21 NOTE — ED Provider Notes (Signed)
[place] [facility] AT [facility] Provider Note   CSN: [id number] Arrival date & time: 04/21/23  4098     History  Chief Complaint  Patient presents with   Hypertension    Karl Garcia is a 62 y.o. male.   Hypertension  Patient with hypertension.  Sent in from urology.  Reportedly due to have transrectal prostate biopsy today.  Feeling fine but reportedly has had high blood pressure.  Reportedly was over 200 systolic at urology and sent down here.  No chest pain.  No trouble breathing.  No swelling of his legs.  Has had aortobifem bypass graft about a month ago.    Past Medical History:  Diagnosis Date   Coronary artery disease    S/p NSTEMI 02/2020 >> s/p CABG // Echocardiogram 6/21: EF 55-60, Gr 2 DD, LAE   Diabetes mellitus Type 2    Essential hypertension    ETOH abuse    Hx of NSTEMI in 02/2020    Hyperlipidemia    L subclavian stenosis    Peripheral Arterial Disease    Tobacco abuse    Past Surgical History:  Procedure Laterality Date   ABDOMINAL AORTOGRAM W/LOWER EXTREMITY Bilateral 07/16/2020   Procedure: ABDOMINAL AORTOGRAM W/LOWER EXTREMITY;  Surgeon: Runell Gess, MD;  Location: [facility];  Service: Cardiovascular;  Laterality: Bilateral;   AORTA - BILATERAL FEMORAL ARTERY BYPASS GRAFT N/A 03/17/2023   Procedure: AORTOBIFEMORAL BYPASS GRAFT USING X HEMASHIELD GOLD VASCULAR GRAFT, LIGATION OF INTERNAL MESENTERIC ARTERY;  Surgeon: Maeola Harman, MD;  Location: [facility] OR;  Service: Vascular;  Laterality: N/A;   CORONARY ARTERY BYPASS GRAFT N/A 02/17/2020   Procedure: CORONARY ARTERY BYPASS GRAFTING (CABG) x Three, using left internal mammry artery and right leg greater saphenous vein harvested endoscopically;  Surgeon: Kerin Perna, MD;  Location: [facility] OR;  Service: Open Heart Surgery;  Laterality: N/A;   LEFT HEART CATH AND CORONARY ANGIOGRAPHY N/A 02/16/2020   Procedure: LEFT HEART CATH AND CORONARY  ANGIOGRAPHY;  Surgeon: Runell Gess, MD;  Location: [facility];  Service: Cardiovascular;  Laterality: N/A;   TEE WITHOUT CARDIOVERSION N/A 02/17/2020   Procedure: TRANSESOPHAGEAL ECHOCARDIOGRAM (TEE);  Surgeon: Donata Clay, Theron Arista, MD;  Location: [facility] OR;  Service: Open Heart Surgery;  Laterality: N/A;   TONSILECTOMY, ADENOIDECTOMY, BILATERAL MYRINGOTOMY AND TUBES     unsure of all of that but + tonsilectomy    Home Medications Prior to Admission medications   Medication Sig Start Date End Date Taking? Authorizing Provider  aspirin EC 81 MG tablet Take 1 tablet (81 mg total) by mouth daily. Swallow whole. Patient not taking: Reported on 04/10/2023 12/09/22   Pricilla Riffle, MD  aspirin EC 81 MG tablet Take 1 tablet (81 mg total) by mouth daily. Swallow whole. 04/10/23   Baglia, Corrina, PA-C  atorvastatin (LIPITOR) 20 MG tablet Take 1 tablet (20 mg total) by mouth daily. 12/09/22   Pricilla Riffle, MD  furosemide (LASIX) 20 MG tablet Take 1 tablet (20 mg total) by mouth daily. 04/10/23   Baglia, Corrina, PA-C  oxyCODONE-acetaminophen (PERCOCET/ROXICET) 5-325 MG tablet Take 1 tablet by mouth every 6 (six) hours as needed for moderate pain. 03/23/23   Lars Mage, PA-C  potassium chloride (KLOR-CON M) 10 MEQ tablet Take 1 tablet (10 mEq total) by mouth daily. 04/03/23   Rhyne, Ames Coupe, PA-C  tamsulosin (FLOMAX) 0.4 MG CAPS capsule Take 1 capsule (0.4 mg total) by mouth  daily. 03/11/23   Joline Maxcy, MD  telmisartan (MICARDIS) 20 MG tablet Take 1 tablet (20 mg total) by mouth daily. 04/06/23   Sharon Seller, NP      Allergies    Morphine and codeine    Review of Systems   Review of Systems  Physical Exam Updated Vital Signs BP (!) 186/90   Pulse 70   Temp 98.1 F (36.7 C) (Oral)   Resp 16   Wt 81 kg   SpO2 93%   BMI 24.22 kg/m  Physical Exam Vitals and nursing note reviewed.  HENT:     Head: Atraumatic.  Cardiovascular:     Rate and Rhythm: Regular rhythm.  Abdominal:      Tenderness: There is no abdominal tenderness.  Musculoskeletal:        General: No tenderness.  Skin:    General: Skin is warm.  Neurological:     Mental Status: He is alert and oriented to person, place, and time.     ED Results / Procedures / Treatments   Labs (all labs ordered are listed, but only abnormal results are displayed) Labs Reviewed - No data to display  EKG None  Radiology No results found.  Procedures Procedures    Medications Ordered in ED Medications - No data to display  ED Course/ Medical Decision Making/ A&P                                 Medical Decision Making  Patient sent in for hypertension.  Asymptomatic.  Does have comorbidities, however no chest pain trouble breathing headache.  Blood pressures come down from over 200 systolic down to now 160/87.  States he feels as if his blood pressure medicine is kicking in.  Also states he was somewhat nervous about having his procedure done.  Doubt endorgan damage.  Will recheck blood pressure.  Blood pressures still somewhat elevated but not as high as in the office.  Still asymptomatic.  No chest pain.  Do not think we need further workup this time.  Short-term follow-up with PCP for potential adjustment of medication.  Discharged home.       Final Clinical Impression(s) / ED Diagnoses Final diagnoses:  Hypertension, unspecified type    Rx / DC Orders ED Discharge Orders     None         Benjiman Core, MD 04/21/23 1017

## 2023-04-21 NOTE — Discharge Instructions (Signed)
Your blood pressure was elevated today.  Follow-up with your doctor for further management.

## 2023-04-21 NOTE — ED Notes (Signed)
Pt alert, NAD, calm, interactive, resps e/u. Denies discomfort, pain, HA, sob, visual changes, dizziness, light headedness, sx or complaints. "Feel normal". Skin W&D. Here b/c my BP is up and I don't know why, "ready to go home". Family at [facility].

## 2023-04-21 NOTE — ED Triage Notes (Signed)
Hypertension x x 2 weeks , denies chest pain . Recent major cardiovascular surgery on 7/9 .  No cough or shortness of breath .

## 2023-04-22 ENCOUNTER — Encounter: Payer: Self-pay | Admitting: Physician Assistant

## 2023-04-22 ENCOUNTER — Ambulatory Visit (INDEPENDENT_AMBULATORY_CARE_PROVIDER_SITE_OTHER): Payer: 59 | Admitting: Physician Assistant

## 2023-04-22 VITALS — BP 172/108 | HR 81 | Temp 98.0°F | Resp 18 | Ht 72.0 in | Wt 174.9 lb

## 2023-04-22 DIAGNOSIS — Z95828 Presence of other vascular implants and grafts: Secondary | ICD-10-CM

## 2023-04-22 DIAGNOSIS — F17200 Nicotine dependence, unspecified, uncomplicated: Secondary | ICD-10-CM

## 2023-04-22 NOTE — Progress Notes (Signed)
Office Note     CC:  follow up Requesting Provider:  Sharon Seller, NP  HPI: Karl Garcia is a 62 y.o. ([birth year]) male who presents post op follow up after ABF bypass and bilateral common femoral endarterectomies with ligation of the IMA on 03/17/23 by Dr. Randie Heinz and Dr. Karin Lieu. He has progressed well post operatively. At his last visit he continued to have moderate amount of swelling in his legs. Scrotal swelling was almost resolved. He took the Lasix as prescribed and has been wearing the compression stockings with improvement. Pain overall improved. Continues to have numbness and tingling in the right foot but this is slowly improving. He was having some suprapubic discomfort and trouble urinating but he is followed by Urology. The suprapubic discomfort is just at distal aspect of his abdominal incision. He says this still feels like a ice pick sticking him at times. The fullness in his groins though is almost resolved. He has had some urinary issues and he was suppose to undergo a Prostate biopsy yesterday however due to his elevated blood pressure he was advised to go to ER. He reports that he was monitored for a couple hours and then released and advised to follow up with PCP.   Medically managed on Aspirin and Statin. Refills for Aspirin given at last visit. He continues to smoke 1/2 ppd. He remains very active trying to walk and exercise daily.    Past Medical History:  Diagnosis Date   Coronary artery disease    S/p NSTEMI 02/2020 >> s/p CABG // Echocardiogram 6/21: EF 55-60, Gr 2 DD, LAE   Diabetes mellitus Type 2    Essential hypertension    ETOH abuse    Hx of NSTEMI in 02/2020    Hyperlipidemia    L subclavian stenosis    Peripheral Arterial Disease    Tobacco abuse     Past Surgical History:  Procedure Laterality Date   ABDOMINAL AORTOGRAM W/LOWER EXTREMITY Bilateral 07/16/2020   Procedure: ABDOMINAL AORTOGRAM W/LOWER EXTREMITY;  Surgeon: Runell Gess, MD;   Location: [facility];  Service: Cardiovascular;  Laterality: Bilateral;   AORTA - BILATERAL FEMORAL ARTERY BYPASS GRAFT N/A 03/17/2023   Procedure: AORTOBIFEMORAL BYPASS GRAFT USING X HEMASHIELD GOLD VASCULAR GRAFT, LIGATION OF INTERNAL MESENTERIC ARTERY;  Surgeon: Maeola Harman, MD;  Location: [facility] OR;  Service: Vascular;  Laterality: N/A;   CORONARY ARTERY BYPASS GRAFT N/A 02/17/2020   Procedure: CORONARY ARTERY BYPASS GRAFTING (CABG) x Three, using left internal mammry artery and right leg greater saphenous vein harvested endoscopically;  Surgeon: Kerin Perna, MD;  Location: [facility] OR;  Service: Open Heart Surgery;  Laterality: N/A;   LEFT HEART CATH AND CORONARY ANGIOGRAPHY N/A 02/16/2020   Procedure: LEFT HEART CATH AND CORONARY ANGIOGRAPHY;  Surgeon: Runell Gess, MD;  Location: [facility];  Service: Cardiovascular;  Laterality: N/A;   TEE WITHOUT CARDIOVERSION N/A 02/17/2020   Procedure: TRANSESOPHAGEAL ECHOCARDIOGRAM (TEE);  Surgeon: Donata Clay, Theron Arista, MD;  Location: [facility] OR;  Service: Open Heart Surgery;  Laterality: N/A;   TONSILECTOMY, ADENOIDECTOMY, BILATERAL MYRINGOTOMY AND TUBES     unsure of all of that but + tonsilectomy    Social History   Socioeconomic History   Marital status: Single    Spouse name: Not on file   Number of children: Not on file   Years of education: Not on file   Highest education level: Not on file  Occupational History  Occupation: Unemployed for about 6 months now    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  Tobacco Use   Smoking status: Every Day    Current packs/day: 0.50    Average packs/day: 0.5 packs/day for 50.0 years (25.0 ttl pk-yrs)    Types: Cigarettes   Smokeless tobacco: Never   Tobacco comments:    Discussed with patient he is interested but not committed at this time  Vaping Use   Vaping status: Never Used  Substance and Sexual Activity   Alcohol use: Yes    Comment: a couple of drinks a day    Drug use: Never   Sexual activity: Yes  Other Topics Concern   Not on file  Social History Narrative   Tobacco use, amount per day now: 1/2 pack    Past tobacco use, amount per day: 1 pack    How many years did you use tobacco: 50 years    Alcohol use (drinks per week): 3-4 drinks    Diet: healthy    Do you drink/eat things with caffeine: yes, coffee   Marital status:  single                                What year were you married? N/A   Do you live in a house, apartment, assisted living, condo, trailer, etc.? Currently homeless   Is it one or more stories? N/A   How many persons live in your home? N/A   Do you have pets in your home?( please list) N/A   Highest Level of education completed? Some college, Public relations account executive.    Current or past profession: Land   Do you exercise?   Yes, No                               Type and how often? Walking everyday   Do you have a living will? No   Do you have a DNR form?   Yes                                If not, do you want to discuss one?   Do you have signed POA/HPOA forms? No                        If so, please bring to you appointment      Do you have any difficulty bathing or dressing yourself? No   Do you have any difficulty preparing food or eating? No   Do you have any difficulty managing your medications? No   Do you have any difficulty managing your finances? Yes   Do you have any difficulty affording your medications?  No   Social Determinants of Health   Financial Resource Strain: Low Risk  (03/19/2023)   Overall Financial Resource Strain (CARDIA)    Difficulty of Paying Living Expenses: Not hard at all  Food Insecurity: Patient Declined (03/24/2023)   Hunger Vital Sign    Worried About Running Out of Food in the Last Year: Patient declined    Ran Out of Food in the Last Year: Patient declined  Transportation Needs: Patient Declined (03/24/2023)   PRAPARE - Administrator, Civil Service (Medical): Patient declined     Lack of Transportation (Non-Medical):  Patient declined  Physical Activity: Sufficiently Active (03/19/2023)   Exercise Vital Sign    Days of Exercise per Week: 7 days    Minutes of Exercise per Session: 60 min  Stress: No Stress Concern Present (03/19/2023)   [employer] of Occupational Health - Occupational Stress Questionnaire    Feeling of Stress : Not at all  Social Connections: Moderately Isolated (03/19/2023)   Social Connection and Isolation Panel [NHANES]    Frequency of Communication with Friends and Family: More than three times a week    Frequency of Social Gatherings with Friends and Family: More than three times a week    Attends Religious Services: Never    Database administrator or Organizations: No    Attends Banker Meetings: Never    Marital Status: Living with partner  Intimate Partner Violence: Not At Risk (03/19/2023)   Humiliation, Afraid, Rape, and Kick questionnaire    Fear of Current or Ex-Partner: No    Emotionally Abused: No    Physically Abused: No    Sexually Abused: No    Family History  Problem Relation Age of Onset   Heart attack Mother    Heart disease Father    Heart attack Father    Heart failure Father    Aneurysm Father    Healthy Brother    Heart disease Paternal Uncle     Current Outpatient Medications  Medication Sig Dispense Refill   aspirin EC 81 MG tablet Take 1 tablet (81 mg total) by mouth daily. Swallow whole. (Patient not taking: Reported on 04/10/2023) 100 tablet 3   aspirin EC 81 MG tablet Take 1 tablet (81 mg total) by mouth daily. Swallow whole. 30 tablet 12   atorvastatin (LIPITOR) 20 MG tablet Take 1 tablet (20 mg total) by mouth daily. 90 tablet 3   furosemide (LASIX) 20 MG tablet Take 1 tablet (20 mg total) by mouth daily. 3 tablet 0   oxyCODONE-acetaminophen (PERCOCET/ROXICET) 5-325 MG tablet Take 1 tablet by mouth every 6 (six) hours as needed for moderate pain. (Patient not taking: Reported on 04/22/2023)  30 tablet 0   potassium chloride (KLOR-CON M) 10 MEQ tablet Take 1 tablet (10 mEq total) by mouth daily. 5 tablet 0   tamsulosin (FLOMAX) 0.4 MG CAPS capsule Take 1 capsule (0.4 mg total) by mouth daily. (Patient not taking: Reported on 04/22/2023) 90 capsule 3   telmisartan (MICARDIS) 20 MG tablet Take 1 tablet (20 mg total) by mouth daily. 30 tablet 0   No current facility-administered medications for this visit.    Allergies  Allergen Reactions   Morphine And Codeine Rash     REVIEW OF SYSTEMS:  [X]  denotes positive finding, [ ]  denotes negative finding Cardiac  Comments:  Chest pain or chest pressure:    Shortness of breath upon exertion:    Short of breath when lying flat:    Irregular heart rhythm:        Vascular    Pain in calf, thigh, or hip brought on by ambulation:    Pain in feet at night that wakes you up from your sleep:     Blood clot in your veins:    Leg swelling:         Pulmonary    Oxygen at home:    Productive cough:     Wheezing:         Neurologic    Sudden weakness in arms or legs:     Sudden  numbness in arms or legs:     Sudden onset of difficulty speaking or slurred speech:    Temporary loss of vision in one eye:     Problems with dizziness:         Gastrointestinal    Blood in stool:     Vomited blood:         Genitourinary    Burning when urinating:     Blood in urine:        Psychiatric    Major depression:         Hematologic    Bleeding problems:    Problems with blood clotting too easily:        Skin    Rashes or ulcers:        Constitutional    Fever or chills:      PHYSICAL EXAMINATION:  Vitals:   04/22/23 0945  BP: (!) 172/108  Pulse: 81  Resp: 18  Temp: 98 F (36.7 C)  TempSrc: Temporal  SpO2: 96%  Weight: 174 lb 14.4 oz (79.3 kg)  Height: 6' (1.829 m)    General:  WDWN in NAD; vital signs documented above Gait: Normal HENT: WNL, normocephalic Pulmonary: normal non-labored breathing Cardiac: regular  HR Abdomen: soft, NT, midline incision well healed. The distal aspect of the laparotomy incision there is small eschar secondary to sitting right where his belt line is Vascular Exam/Pulses: 2+ femoral, Left DP palpable Right Doppler signal Extremities: without ischemic changes, without Gangrene , without cellulitis; without open wounds;  Musculoskeletal: no muscle wasting or atrophy  Neurologic: A&O X 3 Psychiatric:  The pt has Normal affect.  ASSESSMENT/PLAN:: 62 y.o. male here for follow up after ABF bypass and bilateral common femoral endarterectomies with ligation of the IMA on 03/17/23 by Dr. Randie Heinz and Dr. Karin Lieu. He has progressed well post operatively. Swelling of BLE greatly improved. He had a seroma in left groin that is almost resolved. His scrotal swelling is resolved. His incisions have all healed very nicely. The distal aspect of the laparotomy incision there is small eschar secondary to sitting right where his belt line is. This is likely why he feels sticking sensation at times.  - Continue compression stockings and elevation as needed for swelling - Encourage continue walking and exercise regimen - continue Aspirin and statin - Follow up with PCP regarding high blood pressure - He will follow up with [place] in 9 months with ABI   Graceann Congress, PA-C [facility] [phone]  Clinic MD:   Randie Heinz

## 2023-04-23 ENCOUNTER — Ambulatory Visit (INDEPENDENT_AMBULATORY_CARE_PROVIDER_SITE_OTHER): Payer: 59 | Admitting: Family

## 2023-04-23 ENCOUNTER — Encounter: Payer: Self-pay | Admitting: Family

## 2023-04-23 VITALS — BP 166/86 | HR 101 | Temp 97.7°F | Resp 18 | Ht 72.0 in | Wt 176.6 lb

## 2023-04-23 DIAGNOSIS — I10 Essential (primary) hypertension: Secondary | ICD-10-CM | POA: Diagnosis not present

## 2023-04-23 DIAGNOSIS — B372 Candidiasis of skin and nail: Secondary | ICD-10-CM | POA: Diagnosis not present

## 2023-04-23 MED ORDER — BLOOD PRESSURE MONITOR DEVI
0 refills | Status: DC
Start: 2023-04-23 — End: 2023-05-15

## 2023-04-23 MED ORDER — TELMISARTAN 40 MG PO TABS
40.0000 mg | ORAL_TABLET | Freq: Every day | ORAL | 0 refills | Status: DC
Start: 2023-04-23 — End: 2023-07-21

## 2023-04-23 MED ORDER — NYSTATIN 100000 UNIT/GM EX CREA: 1.0000 | TOPICAL_CREAM | Freq: Two times a day (BID) | CUTANEOUS | 0 refills | Status: DC

## 2023-04-23 NOTE — Progress Notes (Signed)
Provider: Madicyn Mesina FNP-C  Sharon Seller, NP  Patient Care Team: Sharon Seller, NP as PCP - General (Geriatric Medicine) Pricilla Riffle, MD as PCP - Cardiology (Cardiology) Bridgett Larsson, LCSW as Social Worker (Licensed Clinical Social Worker)  Extended Emergency Contact Information Primary Emergency Contact: Graham,[name] Mobile Phone: [phone] Relation: Significant other Preferred language: English Interpreter needed? No Secondary Emergency Contact: Colan [place] Home Phone: [phone] Mobile Phone: [phone] Relation: Mother  Code Status:  Full Code  Goals of care: Advanced Directive information    04/23/2023    2:38 PM  Advanced Directives  Does Patient Have a Medical Advance Directive? No  Would patient like information on creating a medical advance directive? No - Patient declined     Chief Complaint  Patient presents with   Medical Management of Chronic Issues    Patient is being seen for a follow up on BP. And rash on groin area   Immunizations    Patient is due for covid, shingles and flu vaccine    Health Maintenance    Discuss the need for Colonoscopy    HPI:  Pt is a 62 y.o. male seen today for an acute visit for follow-up blood pressure and rash on groin area. He was seen in the ED on 04/21/2023 after he was sent from the urologist office to the emergency room after he had a transrectal prostate biopsy reported high blood pressure over 200's systolic at the urologist.He also status post aortobifem bypass graft a month ago.He denies any headache,dizziness,vision changes,fatigue,chest tightness,palpitation,chest pain or shortness of breath. Blood pressure went down to 160/87 in the ED and felt like his blood pressure medications had started working.  He remained asymptomatic and stable.  He was discharged home to follow-up with PCP for potential adjustment of medication. Went to vascular speciality yesterday  B/p was still high 170's.   Past Medical History:  Diagnosis Date   Coronary artery disease    S/p NSTEMI 02/2020 >> s/p CABG // Echocardiogram 6/21: EF 55-60, Gr 2 DD, LAE   Diabetes mellitus Type 2    Essential hypertension    ETOH abuse    Hx of NSTEMI in 02/2020    Hyperlipidemia    L subclavian stenosis    Peripheral Arterial Disease    Tobacco abuse    Past Surgical History:  Procedure Laterality Date   ABDOMINAL AORTOGRAM W/LOWER EXTREMITY Bilateral 07/16/2020   Procedure: ABDOMINAL AORTOGRAM W/LOWER EXTREMITY;  Surgeon: Runell Gess, MD;  Location: [facility];  Service: Cardiovascular;  Laterality: Bilateral;   AORTA - BILATERAL FEMORAL ARTERY BYPASS GRAFT N/A 03/17/2023   Procedure: AORTOBIFEMORAL BYPASS GRAFT USING X HEMASHIELD GOLD VASCULAR GRAFT, LIGATION OF INTERNAL MESENTERIC ARTERY;  Surgeon: Maeola Harman, MD;  Location: [facility] OR;  Service: Vascular;  Laterality: N/A;   CORONARY ARTERY BYPASS GRAFT N/A 02/17/2020   Procedure: CORONARY ARTERY BYPASS GRAFTING (CABG) x Three, using left internal mammry artery and right leg greater saphenous vein harvested endoscopically;  Surgeon: Kerin Perna, MD;  Location: [facility] OR;  Service: Open Heart Surgery;  Laterality: N/A;   LEFT HEART CATH AND CORONARY ANGIOGRAPHY N/A 02/16/2020   Procedure: LEFT HEART CATH AND CORONARY ANGIOGRAPHY;  Surgeon: Runell Gess, MD;  Location: [facility];  Service: Cardiovascular;  Laterality: N/A;   TEE WITHOUT CARDIOVERSION N/A 02/17/2020   Procedure: TRANSESOPHAGEAL ECHOCARDIOGRAM (TEE);  Surgeon: Donata Clay, Theron Arista, MD;  Location: [facility];  Service: Open Heart Surgery;  Laterality: N/A;   TONSILECTOMY, ADENOIDECTOMY, BILATERAL MYRINGOTOMY AND TUBES     unsure of all of that but + tonsilectomy    Allergies  Allergen Reactions   Morphine And Codeine Rash    Outpatient Encounter Medications as of 04/23/2023  Medication Sig   aspirin EC 81 MG tablet Take 1  tablet (81 mg total) by mouth daily. Swallow whole.   aspirin EC 81 MG tablet Take 1 tablet (81 mg total) by mouth daily. Swallow whole.   atorvastatin (LIPITOR) 20 MG tablet Take 1 tablet (20 mg total) by mouth daily.   furosemide (LASIX) 20 MG tablet Take 1 tablet (20 mg total) by mouth daily.   potassium chloride (KLOR-CON M) 10 MEQ tablet Take 1 tablet (10 mEq total) by mouth daily.   tamsulosin (FLOMAX) 0.4 MG CAPS capsule Take 1 capsule (0.4 mg total) by mouth daily.   telmisartan (MICARDIS) 20 MG tablet Take 1 tablet (20 mg total) by mouth daily.   oxyCODONE-acetaminophen (PERCOCET/ROXICET) 5-325 MG tablet Take 1 tablet by mouth every 6 (six) hours as needed for moderate pain. (Patient not taking: Reported on 04/22/2023)   No facility-administered encounter medications on file as of 04/23/2023.    Review of Systems  Constitutional:  Negative for appetite change, chills, fatigue, fever and unexpected weight change.  HENT:  Negative for congestion, dental problem, ear discharge, ear pain, facial swelling, hearing loss, nosebleeds, postnasal drip, rhinorrhea, sinus pressure, sinus pain, sneezing, sore throat and trouble swallowing.   Eyes:  Negative for pain, discharge, redness, itching and visual disturbance.  Respiratory:  Negative for cough, chest tightness, shortness of breath and wheezing.   Cardiovascular:  Negative for chest pain, palpitations and leg swelling.  Gastrointestinal:  Negative for abdominal distention, abdominal pain, blood in stool, constipation, diarrhea, nausea and vomiting.  Endocrine: Negative for cold intolerance, heat intolerance, polydipsia, polyphagia and polyuria.  Genitourinary:  Negative for difficulty urinating, dysuria, flank pain, frequency and urgency.  Musculoskeletal:  Negative for arthralgias, back pain, gait problem, joint swelling, myalgias, neck pain and neck stiffness.  Skin:  Positive for rash. Negative for color change, pallor and wound.        Inner thigh and scrotum areas   Neurological:  Negative for dizziness, syncope, speech difficulty, weakness, light-headedness, numbness and headaches.  Hematological:  Does not bruise/bleed easily.  Psychiatric/Behavioral:  Negative for agitation, behavioral problems, confusion, hallucinations, self-injury, sleep disturbance and suicidal ideas. The patient is not nervous/anxious.     Immunization History  Administered Date(s) Administered   Janssen (J&J) SARS-COV-2 Vaccination 02/25/2020   Tdap 09/24/2013   Pertinent  Health Maintenance Due  Topic Date Due   Colonoscopy  Never done   INFLUENZA VACCINE  04/09/2023      01/06/2021    7:59 AM 12/03/2022    9:55 AM 12/29/2022    1:26 PM 03/06/2023    9:39 AM 04/23/2023    2:38 PM  Fall Risk  Falls in the past year?  0 0 0 0  Was there an injury with Fall?  0 0 0 0  Fall Risk Category Calculator  0 0 0 0  (RETIRED) Patient Fall Risk Level Moderate fall risk      Patient at Risk for Falls Due to  No Fall Risks No Fall Risks No Fall Risks No Fall Risks  Fall risk Follow up  Falls evaluation completed Falls evaluation completed Falls evaluation completed Falls evaluation completed   Functional Status Survey:  Vitals:   04/23/23 1436  BP: (!) 166/86  Pulse: (!) 101  Resp: 18  Temp: 97.7 F (36.5 C)  TempSrc: Temporal  SpO2: 97%  Weight: 176 lb 9.6 oz (80.1 kg)  Height: 6' (1.829 m)   Body mass index is 23.95 kg/m. Physical Exam Vitals reviewed.  Constitutional:      General: He is not in acute distress.    Appearance: Normal appearance. He is normal weight. He is not ill-appearing or diaphoretic.  HENT:     Head: Normocephalic.     Right Ear: Tympanic membrane, ear canal and external ear normal. There is no impacted cerumen.     Left Ear: Tympanic membrane, ear canal and external ear normal. There is no impacted cerumen.     Nose: Nose normal. No congestion or rhinorrhea.     Mouth/Throat:     Mouth: Mucous membranes are  moist.     Pharynx: Oropharynx is clear. No oropharyngeal exudate or posterior oropharyngeal erythema.  Eyes:     General: No scleral icterus.       Right eye: No discharge.        Left eye: No discharge.     Extraocular Movements: Extraocular movements intact.     Conjunctiva/sclera: Conjunctivae normal.     Pupils: Pupils are equal, round, and reactive to light.  Neck:     Vascular: No carotid bruit.  Cardiovascular:     Rate and Rhythm: Normal rate and regular rhythm.     Pulses: Normal pulses.     Heart sounds: Normal heart sounds. No murmur heard.    No friction rub. No gallop.  Pulmonary:     Effort: Pulmonary effort is normal. No respiratory distress.     Breath sounds: Normal breath sounds. No wheezing, rhonchi or rales.  Chest:     Chest wall: No tenderness.  Abdominal:     General: Bowel sounds are normal. There is no distension.     Palpations: Abdomen is soft. There is no mass.     Tenderness: There is no abdominal tenderness. There is no right CVA tenderness, left CVA tenderness, guarding or rebound.  Musculoskeletal:        General: No swelling or tenderness. Normal range of motion.     Cervical back: Normal range of motion. No rigidity or tenderness.     Right lower leg: No edema.     Left lower leg: No edema.  Lymphadenopathy:     Cervical: No cervical adenopathy.  Skin:    General: Skin is warm and dry.     Coloration: Skin is not pale.     Findings: Rash present. No bruising, erythema or lesion.     Comments: Scrotum and inner thigh red rash.no drainage noted   Neurological:     Mental Status: He is alert and oriented to person, place, and time.     Cranial Nerves: No cranial nerve deficit.     Sensory: No sensory deficit.     Motor: No weakness.     Coordination: Coordination normal.     Gait: Gait normal.  Psychiatric:        Mood and Affect: Mood normal.        Speech: Speech normal.        Behavior: Behavior normal.        Thought Content: Thought  content normal.        Judgment: Judgment normal.     Labs reviewed: Recent Labs    03/17/23  1300 03/18/23 0406 03/19/23 0405 03/20/23 0354 03/22/23 1143 04/06/23 0958  NA 138 137   < > 129* 133* 138  K 4.5 4.1   < > 3.8 3.5 4.2  CL 103 105   < > 101 95* 98  CO2 20* 23   < > 20* 28 29  GLUCOSE 189* 113*   < > 106* 134* 119*  BUN 9 15   < > 15 10 7   CREATININE 0.93 1.16   < > 1.07 1.06 0.79  CALCIUM 7.5* 7.3*   < > 7.2* 8.2* 9.4  MG 1.3* 1.8  --   --   --   --    < > = values in this interval not displayed.   Recent Labs    03/04/23 0830 03/18/23 0406 04/06/23 0958  AST 29 23 20   ALT 19 13 11   ALKPHOS 83 41  --   BILITOT 0.7 0.6 0.4  PROT 7.2 5.2* 6.6  ALBUMIN 3.6 3.1*  --    Recent Labs    12/03/22 1055 03/04/23 0830 03/19/23 1712 03/20/23 0354 04/06/23 0958  WBC 5.4   < > 8.1 7.4 6.1  NEUTROABS 3,629  --   --   --  4,148  HGB 14.5   < > 9.5* 8.9* 11.6*  HCT 41.7   < > 28.1* 26.6* 35.1*  MCV 99.5   < > 99.6 99.6 100.3*  PLT 139*   < > 105* 102* 324   < > = values in this interval not displayed.   Lab Results  Component Value Date   TSH 1.563 02/17/2020   Lab Results  Component Value Date   HGBA1C 6.2 (H) 03/04/2023   Lab Results  Component Value Date   CHOL 108 03/20/2023   HDL 41 03/20/2023   LDLCALC 50 03/20/2023   TRIG 87 03/20/2023   CHOLHDL 2.6 03/20/2023    Significant Diagnostic Results in last 30 days:  VAS [place] ABI WITH/WO TBI  Result Date: 04/03/2023  LOWER EXTREMITY DOPPLER STUDY Patient Name:  YOSUF FOLWELL  Date of Exam:   04/03/2023 Medical Rec #: [id number]         Accession #:    [id number] Date of Birth: [birth year]         Patient Gender: M Patient Age:   63 years Exam Location:  [facility] Procedure:      VAS [place] ABI WITH/WO TBI Referring Phys: Lemar Livings --------------------------------------------------------------------------------  Indications: Triage note 04/02/23: Pt c/o of intermittent stabbing pain  between              his naval and his groin. He states he still has a lot of swelling              at his groin, genitals, and legs. Pt denies any other symptoms. Pt              has been elevating in his recliner chair. Instructed pt to properly              elevate, take OTC pain meds, and scheduled pt for a post-op appt              with ABI as directed on the surgical f/u protocol  Vascular Interventions: 03/17/23: Aortobifemoral bypass graft. Performing Technologist: Thereasa Parkin RVT  Examination Guidelines: A complete evaluation includes at minimum, Doppler waveform signals and systolic blood pressure reading at the level of bilateral brachial, anterior tibial, and posterior tibial  arteries, when vessel segments are accessible. Bilateral testing is considered an integral part of a complete examination. Photoelectric Plethysmograph (PPG) waveforms and toe systolic pressure readings are included as required and additional duplex testing as needed. Limited examinations for reoccurring indications may be performed as noted.  ABI Findings: +---------+------------------+-----+----------+--------+ Right    Rt Pressure (mmHg)IndexWaveform  Comment  +---------+------------------+-----+----------+--------+ Brachial 170                                       +---------+------------------+-----+----------+--------+ PTA      107               0.63 monophasic         +---------+------------------+-----+----------+--------+ DP       117               0.69 monophasic         +---------+------------------+-----+----------+--------+ Great Toe81                0.48                    +---------+------------------+-----+----------+--------+ +---------+------------------+-----+----------+-----------------------------+ Left     Lt Pressure (mmHg)IndexWaveform  Comment                       +---------+------------------+-----+----------+-----------------------------+ Brachial 121                               Known left subclavian disease +---------+------------------+-----+----------+-----------------------------+ PTA      188               1.11 monophasic                              +---------+------------------+-----+----------+-----------------------------+ DP       73                0.43 monophasic                              +---------+------------------+-----+----------+-----------------------------+ Great Toe110               0.65                                         +---------+------------------+-----+----------+-----------------------------+ +-------+-----------+-----------+------------+------------+ ABI/TBIToday's ABIToday's TBIPrevious ABIPrevious TBI +-------+-----------+-----------+------------+------------+ Right  0.69       0.48       0.43        0.43         +-------+-----------+-----------+------------+------------+ Left   1.11       0.65       0.88        0.57         +-------+-----------+-----------+------------+------------+  Previous ABI on 12/26/22:.  Summary: Right: Resting right ankle-brachial index indicates moderate right lower extremity arterial disease. The right toe-brachial index is abnormal. Left: Resting left ankle-brachial index is within normal range. The left toe-brachial index is abnormal. *See table(s) above for measurements and observations.  Electronically signed by Gerarda Fraction on 04/03/2023 at 5:10:20 PM.    Final     Assessment/Plan 1. Primary hypertension B/p has been high  - will increase Telmisartan from 20 mg  to 40 mg tablet daily  - continue to monitor B/p and notify provider for B/p > 140/90 and record on log provided. - telmisartan (MICARDIS) 40 MG tablet; Take 1 tablet (40 mg total) by mouth daily.  Dispense: 90 tablet; Refill: 0 - Blood Pressure Monitor DEVI; Check blood pressure daily and as needed.Notify provider if > 140/90  Dispense: 1 each; Refill: 0 - Follow up in 2 weeks to recheck B/p   2.  Candidiasis of skin Scrotum and inner thigh red rash.no drainage noted  - advised to keep wash area clean,pat dry and apply Nystatin  - nystatin cream (MYCOSTATIN); Apply 1 Application topically 2 (two) times daily.  Dispense: 30 g; Refill: 0  Family/ staff Communication: Reviewed plan of care with patient verbalized understanding   Labs/tests ordered: None   Next Appointment: Return in about 2 weeks (around 05/07/2023) for Blood pressure check .   Caesar Bookman, NP

## 2023-04-24 ENCOUNTER — Other Ambulatory Visit: Payer: Self-pay

## 2023-04-24 DIAGNOSIS — I739 Peripheral vascular disease, unspecified: Secondary | ICD-10-CM

## 2023-05-02 ENCOUNTER — Other Ambulatory Visit: Payer: Self-pay | Admitting: Nurse Practitioner

## 2023-05-02 DIAGNOSIS — I10 Essential (primary) hypertension: Secondary | ICD-10-CM

## 2023-05-07 ENCOUNTER — Ambulatory Visit: Payer: 59 | Admitting: Orthopedic Surgery

## 2023-05-14 ENCOUNTER — Encounter: Payer: Self-pay | Admitting: Nurse Practitioner

## 2023-05-15 ENCOUNTER — Encounter: Payer: Self-pay | Admitting: Nurse Practitioner

## 2023-05-15 ENCOUNTER — Ambulatory Visit (INDEPENDENT_AMBULATORY_CARE_PROVIDER_SITE_OTHER): Payer: 59 | Admitting: Nurse Practitioner

## 2023-05-15 VITALS — BP 120/82 | HR 70 | Temp 96.8°F | Ht 72.0 in | Wt 181.0 lb

## 2023-05-15 DIAGNOSIS — Z1212 Encounter for screening for malignant neoplasm of rectum: Secondary | ICD-10-CM

## 2023-05-15 DIAGNOSIS — D649 Anemia, unspecified: Secondary | ICD-10-CM

## 2023-05-15 DIAGNOSIS — I10 Essential (primary) hypertension: Secondary | ICD-10-CM | POA: Diagnosis not present

## 2023-05-15 DIAGNOSIS — Z1211 Encounter for screening for malignant neoplasm of colon: Secondary | ICD-10-CM

## 2023-05-15 MED ORDER — BLOOD PRESSURE MONITOR DEVI
0 refills | Status: DC
Start: 2023-05-15 — End: 2023-09-14

## 2023-05-15 NOTE — Progress Notes (Signed)
Careteam: Patient Care Team: Sharon Seller, NP as PCP - General (Geriatric Medicine) Pricilla Riffle, MD as PCP - Cardiology (Cardiology) Bridgett Larsson, LCSW as Social Worker (Licensed Clinical Social Worker)  PLACE OF SERVICE:  [facility] CLINIC  Advanced Directive information Does Patient Have a Medical Advance Directive?: No, Would patient like information on creating a medical advance directive?: No - Patient declined  Allergies  Allergen Reactions   Morphine And Codeine Rash    Chief Complaint  Patient presents with   Follow-up    Blood pressure check. NCIR verified. Discuss need for colonoscopy. Discuss medications and one's marked as not taking.      HPI: Patient is a 62 y.o. male for blood pressure follow up Does not have a monitor to take blood pressure at home.  Has not received.  He has been very diligent about taking medication.  Uses minimal salt.    He had plans to do colonoscopy but cancelled due to bp being high and did not reschedule. This was in [place].   No side effects noted from medication  Review of Systems:  Review of Systems  Constitutional:  Negative for chills, fever and weight loss.  HENT:  Negative for tinnitus.   Respiratory:  Negative for cough, sputum production and shortness of breath.   Cardiovascular:  Negative for chest pain, palpitations and leg swelling.  Gastrointestinal:  Negative for abdominal pain, constipation, diarrhea and heartburn.  Genitourinary:  Negative for dysuria, frequency and urgency.  Musculoskeletal:  Negative for back pain, falls, joint pain and myalgias.  Skin: Negative.   Neurological:  Negative for dizziness and headaches.  Psychiatric/Behavioral:  Negative for depression and memory loss. The patient does not have insomnia.     Past Medical History:  Diagnosis Date   Coronary artery disease    S/p NSTEMI 02/2020 >> s/p CABG // Echocardiogram 6/21: EF 55-60, Gr 2 DD, LAE   Diabetes mellitus Type 2     Essential hypertension    ETOH abuse    Hx of NSTEMI in 02/2020    Hyperlipidemia    L subclavian stenosis    Peripheral Arterial Disease    Tobacco abuse    Past Surgical History:  Procedure Laterality Date   ABDOMINAL AORTOGRAM W/LOWER EXTREMITY Bilateral 07/16/2020   Procedure: ABDOMINAL AORTOGRAM W/LOWER EXTREMITY;  Surgeon: Runell Gess, MD;  [facility];  Service: Cardiovascular;  Laterality: Bilateral;   AORTA - BILATERAL FEMORAL ARTERY BYPASS GRAFT N/A 03/17/2023   Procedure: AORTOBIFEMORAL BYPASS GRAFT USING X HEMASHIELD GOLD VASCULAR GRAFT, LIGATION OF INTERNAL MESENTERIC ARTERY;  Surgeon: Maeola Harman, MD;  Location: [facility] OR;  Service: Vascular;  Laterality: N/A;   CORONARY ARTERY BYPASS GRAFT N/A 02/17/2020   Procedure: CORONARY ARTERY BYPASS GRAFTING (CABG) x Three, using left internal mammry artery and right leg greater saphenous vein harvested endoscopically;  Surgeon: Kerin Perna, MD;  Location: [facility] OR;  Service: Open Heart Surgery;  Laterality: N/A;   LEFT HEART CATH AND CORONARY ANGIOGRAPHY N/A 02/16/2020   Procedure: LEFT HEART CATH AND CORONARY ANGIOGRAPHY;  Surgeon: Runell Gess, MD;  Location: [facility] INVAS[facility] CV LAB;  Service: Cardiovascular;  Laterality: N/A;   TEE WITHOUT CARDIOVERSION N/A 02/17/2020   Procedure: TRANSESOPHAGEAL ECHOCARDIOGRAM (TEE);  Surgeon: Donata Clay, Theron Arista, MD;  Location: [facility] OR;  Service: Open Heart Surgery;  Laterality: N/A;   TONSILECTOMY, ADENOIDECTOMY, BILATERAL MYRINGOTOMY AND TUBES     unsure of all of that  but + tonsilectomy   Social History:   reports that he has been smoking cigarettes. He has a 25 pack-year smoking history. He has never used smokeless tobacco. He reports current alcohol use. He reports that he does not use drugs.  Family History  Problem Relation Age of Onset   Heart attack Mother    Heart disease Father    Heart attack Father    Heart failure Father    Aneurysm Father     Healthy Brother    Heart disease Paternal Uncle     Medications: Patient's Medications  New Prescriptions   No medications on file  Previous Medications   ASPIRIN EC 81 MG TABLET    Take 1 tablet (81 mg total) by mouth daily. Swallow whole.   ASPIRIN EC 81 MG TABLET    Take 1 tablet (81 mg total) by mouth daily. Swallow whole.   ATORVASTATIN (LIPITOR) 20 MG TABLET    Take 1 tablet (20 mg total) by mouth daily.   BLOOD PRESSURE MONITOR DEVI    Check blood pressure daily and as needed.Notify provider if > 140/90   FUROSEMIDE (LASIX) 20 MG TABLET    Take 1 tablet (20 mg total) by mouth daily.   NYSTATIN CREAM (MYCOSTATIN)    Apply 1 Application topically 2 (two) times daily.   POTASSIUM CHLORIDE (KLOR-CON M) 10 MEQ TABLET    Take 1 tablet (10 mEq total) by mouth daily.   TAMSULOSIN (FLOMAX) 0.4 MG CAPS CAPSULE    Take 1 capsule (0.4 mg total) by mouth daily.   TELMISARTAN (MICARDIS) 40 MG TABLET    Take 1 tablet (40 mg total) by mouth daily.  Modified Medications   No medications on file  Discontinued Medications   No medications on file    Physical Exam:  Vitals:   05/14/23 1128  BP: 120/82  Pulse: 70  Temp: (!) 96.8 F (36 C)  TempSrc: Temporal  SpO2: 97%  Weight: 181 lb (82.1 kg)  Height: 6' (1.829 m)   Body mass index is 24.55 kg/m. Wt Readings from Last 3 Encounters:  05/14/23 181 lb (82.1 kg)  04/23/23 176 lb 9.6 oz (80.1 kg)  04/22/23 174 lb 14.4 oz (79.3 kg)    Physical Exam Constitutional:      General: He is not in acute distress.    Appearance: He is well-developed. He is not diaphoretic.  HENT:     Head: Normocephalic and atraumatic.     Right Ear: External ear normal.     Left Ear: External ear normal.     Mouth/Throat:     Pharynx: No oropharyngeal exudate.  Eyes:     Conjunctiva/sclera: Conjunctivae normal.     Pupils: Pupils are equal, round, and reactive to light.  Cardiovascular:     Rate and Rhythm: Normal rate and regular rhythm.     Heart  sounds: Normal heart sounds.  Pulmonary:     Effort: Pulmonary effort is normal.     Breath sounds: Normal breath sounds.  Abdominal:     General: Bowel sounds are normal.     Palpations: Abdomen is soft.  Musculoskeletal:        General: No tenderness.     Cervical back: Normal range of motion and neck supple.     Right lower leg: No edema.     Left lower leg: No edema.  Skin:    General: Skin is warm and dry.  Neurological:     Mental Status: He  is alert and oriented to person, place, and time.     Labs reviewed: Basic Metabolic Panel: Recent Labs    03/17/23 1300 03/18/23 0406 03/19/23 0405 03/20/23 0354 03/22/23 1143 04/06/23 0958  NA 138 137   < > 129* 133* 138  K 4.5 4.1   < > 3.8 3.5 4.2  CL 103 105   < > 101 95* 98  CO2 20* 23   < > 20* 28 29  GLUCOSE 189* 113*   < > 106* 134* 119*  BUN 9 15   < > 15 10 7   CREATININE 0.93 1.16   < > 1.07 1.06 0.79  CALCIUM 7.5* 7.3*   < > 7.2* 8.2* 9.4  MG 1.3* 1.8  --   --   --   --    < > = values in this interval not displayed.   Liver Function Tests: Recent Labs    03/04/23 0830 03/18/23 0406 04/06/23 0958  AST 29 23 20   ALT 19 13 11   ALKPHOS 83 41  --   BILITOT 0.7 0.6 0.4  PROT 7.2 5.2* 6.6  ALBUMIN 3.6 3.1*  --    Recent Labs    03/18/23 0406  AMYLASE 21*   No results for input(s): "AMMONIA" in the last 8760 hours. CBC: Recent Labs    12/03/22 1055 03/04/23 0830 03/19/23 1712 03/20/23 0354 04/06/23 0958  WBC 5.4   < > 8.1 7.4 6.1  NEUTROABS 3,629  --   --   --  4,148  HGB 14.5   < > 9.5* 8.9* 11.6*  HCT 41.7   < > 28.1* 26.6* 35.1*  MCV 99.5   < > 99.6 99.6 100.3*  PLT 139*   < > 105* 102* 324   < > = values in this interval not displayed.   Lipid Panel: Recent Labs    12/03/22 1055 03/20/23 0354  CHOL 183 108  HDL 51 41  LDLCALC 109* 50  TRIG 118 87  CHOLHDL 3.6 2.6   TSH: No results for input(s): "TSH" in the last 8760 hours. A1C: Lab Results  Component Value Date   HGBA1C 6.2  (H) 03/04/2023     Assessment/Plan 1. Encounter for colorectal cancer screening - Cologuard  2. Primary hypertension -Blood pressure well controlled, goal bp <140/90 Continue current medications and dietary modifications follow metabolic panel - CBC with Differential/Platelet - BASIC METABOLIC PANEL WITH GFR - Blood Pressure Monitor DEVI; Check blood pressure daily and as needed.Notify provider if > 140/90  Dispense: 1 each; Refill: 0  3. Anemia, unspecified type Follow up lab at this time.  - CBC with Differential/Platelet  Return in about 4 months (around 09/14/2023) for routine follow up .  Janene Harvey. Biagio Borg [facility] [phone]

## 2023-05-16 LAB — BASIC METABOLIC PANEL WITH GFR
BUN: 15 mg/dL (ref 7–25)
CO2: 28 mmol/L (ref 20–32)
Calcium: 9.5 mg/dL (ref 8.6–10.3)
Chloride: 100 mmol/L (ref 98–110)
Creat: 0.85 mg/dL (ref 0.70–1.35)
Glucose, Bld: 115 mg/dL — ABNORMAL HIGH (ref 65–99)
Potassium: 4.8 mmol/L (ref 3.5–5.3)
Sodium: 136 mmol/L (ref 135–146)
eGFR: 97 mL/min/{1.73_m2} (ref >=60)

## 2023-05-16 LAB — CBC WITH DIFFERENTIAL/PLATELET
Absolute Monocytes: 695 {cells}/uL (ref 200–950)
Basophils Absolute: 50 {cells}/uL (ref 0–200)
Basophils Relative: 1 %
Eosinophils Absolute: 100 {cells}/uL (ref 15–500)
Eosinophils Relative: 2 %
HCT: 41.6 % (ref 38.5–50.0)
Hemoglobin: 14.1 g/dL (ref 13.2–17.1)
Lymphs Abs: 1195 {cells}/uL (ref 850–3900)
MCH: 34.4 pg — ABNORMAL HIGH (ref 27.0–33.0)
MCHC: 33.9 g/dL (ref 32.0–36.0)
MCV: 101.5 fL — ABNORMAL HIGH (ref 80.0–100.0)
MPV: 10.1 fL (ref 7.5–12.5)
Monocytes Relative: 13.9 %
Neutro Abs: 2960 {cells}/uL (ref 1500–7800)
Neutrophils Relative %: 59.2 %
Platelets: 195 10*3/uL (ref 140–400)
RBC: 4.1 10*6/uL — ABNORMAL LOW (ref 4.20–5.80)
RDW: 14.6 % (ref 11.0–15.0)
Total Lymphocyte: 23.9 %
WBC: 5 10*3/uL (ref 3.8–10.8)

## 2023-06-12 LAB — MICROSCOPIC EXAMINATION: Epithelial Cells (non renal): NONE SEEN /[HPF] (ref 0–10)

## 2023-06-12 LAB — URINALYSIS, ROUTINE W REFLEX MICROSCOPIC
Bilirubin, UA: NEGATIVE
Glucose, UA: NEGATIVE
Leukocytes,UA: NEGATIVE
Nitrite, UA: NEGATIVE
Specific Gravity, UA: >1.03 — ABNORMAL HIGH (ref 1.005–1.030)
Urobilinogen, Ur: 1 mg/dL (ref 0.2–1.0)
pH, UA: 5.5 (ref 5.0–7.5)

## 2023-06-17 ENCOUNTER — Ambulatory Visit: Payer: 59 | Admitting: Urology

## 2023-07-21 ENCOUNTER — Other Ambulatory Visit: Payer: Self-pay | Admitting: Family

## 2023-07-21 DIAGNOSIS — I10 Essential (primary) hypertension: Secondary | ICD-10-CM

## 2023-07-21 NOTE — Telephone Encounter (Signed)
Pended Rx and sent to [facility] for approval.

## 2023-09-11 ENCOUNTER — Encounter: Payer: Self-pay | Admitting: Nurse Practitioner

## 2023-09-14 ENCOUNTER — Encounter: Payer: 59 | Admitting: Nurse Practitioner

## 2023-09-14 ENCOUNTER — Ambulatory Visit (INDEPENDENT_AMBULATORY_CARE_PROVIDER_SITE_OTHER): Payer: 59 | Admitting: Nurse Practitioner

## 2023-09-14 ENCOUNTER — Encounter: Payer: Self-pay | Admitting: Nurse Practitioner

## 2023-09-14 VITALS — BP 132/88 | HR 74 | Temp 97.9°F | Resp 20 | Ht 72.0 in | Wt 191.2 lb

## 2023-09-14 DIAGNOSIS — E1169 Type 2 diabetes mellitus with other specified complication: Secondary | ICD-10-CM | POA: Diagnosis not present

## 2023-09-14 DIAGNOSIS — F1010 Alcohol abuse, uncomplicated: Secondary | ICD-10-CM | POA: Diagnosis not present

## 2023-09-14 DIAGNOSIS — I739 Peripheral vascular disease, unspecified: Secondary | ICD-10-CM

## 2023-09-14 DIAGNOSIS — R351 Nocturia: Secondary | ICD-10-CM | POA: Diagnosis not present

## 2023-09-14 DIAGNOSIS — N401 Enlarged prostate with lower urinary tract symptoms: Secondary | ICD-10-CM

## 2023-09-14 DIAGNOSIS — Z720 Tobacco use: Secondary | ICD-10-CM

## 2023-09-14 DIAGNOSIS — I10 Essential (primary) hypertension: Secondary | ICD-10-CM

## 2023-09-14 MED ORDER — TELMISARTAN 40 MG PO TABS
40.0000 mg | ORAL_TABLET | Freq: Every day | ORAL | 1 refills | Status: DC
Start: 2023-09-14 — End: 2024-03-15

## 2023-09-14 MED ORDER — TAMSULOSIN HCL 0.4 MG PO CAPS: 0.8000 mg | ORAL_CAPSULE | Freq: Every day | ORAL | 3 refills | Status: DC

## 2023-09-14 NOTE — Progress Notes (Signed)
 Careteam: Patient Care Team: Karl Harlene POUR, NP as PCP - General (Geriatric Medicine) Okey Vina GAILS, MD as PCP - Cardiology (Cardiology) Ezzard Rolin BIRCH, LCSW as Social Worker (Licensed Clinical Social Worker)  PLACE OF SERVICE:  [facility] CLINIC  Advanced Directive information Does Patient Have a Medical Advance Directive?: No, Would patient like information on creating a medical advance directive?: No - Patient declined  Allergies  Allergen Reactions   Morphine  And Codeine Rash    Chief Complaint  Patient presents with   Medical Management of Chronic Issues    4 month follow up and discuss covid vaccine,fecal dna ,shingles vaccines,and lung cancer screening.     HPI: Patient is a 63 y.o. male for routine follow up  Has the cologuard but has not done yet  Did smoke a cigarette before he came to appt He smokes less than a ppd  Htn- at goal, taking his medication daily  He takes tylenol  daily due to his back being sore.   Has cut back on his alcohol use, 2-3 times a week.  Will drink 2-3 drinks when he drinks. He has shots of liquor.    Review of Systems:  Review of Systems  Constitutional:  Negative for chills, fever and weight loss.  HENT:  Negative for tinnitus.   Respiratory:  Negative for cough, sputum production and shortness of breath.   Cardiovascular:  Negative for chest pain, palpitations and leg swelling.  Gastrointestinal:  Negative for abdominal pain, constipation, diarrhea and heartburn.  Genitourinary:  Negative for dysuria, frequency and urgency.  Musculoskeletal:  Positive for back pain. Negative for falls, joint pain and myalgias.  Skin: Negative.   Neurological:  Negative for dizziness and headaches.  Psychiatric/Behavioral:  Negative for depression and memory loss. The patient does not have insomnia.     Past Medical History:  Diagnosis Date   Coronary artery disease    S/p NSTEMI 02/2020 >> s/p CABG // Echocardiogram 6/21: EF 55-60, Gr 2  DD, LAE   Diabetes mellitus Type 2    Essential hypertension    ETOH abuse    Hx of NSTEMI in 02/2020    Hyperlipidemia    L subclavian stenosis    Peripheral Arterial Disease    Tobacco abuse    Past Surgical History:  Procedure Laterality Date   ABDOMINAL AORTOGRAM W/LOWER EXTREMITY Bilateral 07/16/2020   Procedure: ABDOMINAL AORTOGRAM W/LOWER EXTREMITY;  Surgeon: Court Dorn PARAS, MD;  Location: [facility];  Service: Cardiovascular;  Laterality: Bilateral;   AORTA - BILATERAL FEMORAL ARTERY BYPASS GRAFT N/A 03/17/2023   Procedure: AORTOBIFEMORAL BYPASS GRAFT USING X HEMASHIELD GOLD VASCULAR GRAFT, LIGATION OF INTERNAL MESENTERIC ARTERY;  Surgeon: Sheree Penne Bruckner, MD;  Location: [facility] OR;  Service: Vascular;  Laterality: N/A;   CORONARY ARTERY BYPASS GRAFT N/A 02/17/2020   Procedure: CORONARY ARTERY BYPASS GRAFTING (CABG) x Three, using left internal mammry artery and right leg greater saphenous vein harvested endoscopically;  Surgeon: Fleeta Hanford Coy, MD;  Location: [facility] OR;  Service: Open Heart Surgery;  Laterality: N/A;   LEFT HEART CATH AND CORONARY ANGIOGRAPHY N/A 02/16/2020   Procedure: LEFT HEART CATH AND CORONARY ANGIOGRAPHY;  Surgeon: Court Dorn PARAS, MD;  Location: [facility];  Service: Cardiovascular;  Laterality: N/A;   TEE WITHOUT CARDIOVERSION N/A 02/17/2020   Procedure: TRANSESOPHAGEAL ECHOCARDIOGRAM (TEE);  Surgeon: Fleeta Hanford, Coy, MD;  Location: [facility] OR;  Service: Open Heart Surgery;  Laterality: N/A;   TONSILECTOMY, ADENOIDECTOMY, BILATERAL  MYRINGOTOMY AND TUBES     unsure of all of that but + tonsilectomy   Social History:   reports that he has been smoking cigarettes. He has a 25 pack-year smoking history. He has never used smokeless tobacco. He reports current alcohol use. He reports that he does not use drugs.  Family History  Problem Relation Age of Onset   Heart attack Mother    Heart disease Father    Heart attack Father    Heart  failure Father    Aneurysm Father    Healthy Brother    Heart disease Paternal Uncle     Medications: Patient's Medications  New Prescriptions   No medications on file  Previous Medications   ASPIRIN  EC 81 MG TABLET    Take 1 tablet (81 mg total) by mouth daily. Swallow whole.   ASPIRIN  EC 81 MG TABLET    Take 1 tablet (81 mg total) by mouth daily. Swallow whole.   ATORVASTATIN  (LIPITOR ) 20 MG TABLET    Take 1 tablet (20 mg total) by mouth daily.   BLOOD PRESSURE MONITOR DEVI    Check blood pressure daily and as needed.Notify provider if > 140/90   FUROSEMIDE  (LASIX ) 20 MG TABLET    Take 1 tablet (20 mg total) by mouth daily.   NYSTATIN  CREAM (MYCOSTATIN )    Apply 1 Application topically 2 (two) times daily.   POTASSIUM CHLORIDE  (KLOR-CON  M) 10 MEQ TABLET    Take 1 tablet (10 mEq total) by mouth daily.   TAMSULOSIN  (FLOMAX ) 0.4 MG CAPS CAPSULE    Take 1 capsule (0.4 mg total) by mouth daily.   TELMISARTAN  (MICARDIS ) 40 MG TABLET    TAKE 1 TABLET BY MOUTH DAILY  Modified Medications   No medications on file  Discontinued Medications   No medications on file    Physical Exam:  Vitals:   09/14/23 1346  BP: 132/88  Pulse: 74  Resp: 20  Temp: 97.9 F (36.6 C)  SpO2: 94%  Weight: 191 lb 3.2 oz (86.7 kg)  Height: 6' (1.829 m)   Body mass index is 25.93 kg/m. Wt Readings from Last 3 Encounters:  09/14/23 191 lb 3.2 oz (86.7 kg)  05/14/23 181 lb (82.1 kg)  04/23/23 176 lb 9.6 oz (80.1 kg)    Physical Exam Constitutional:      General: He is not in acute distress.    Appearance: He is well-developed. He is not diaphoretic.  HENT:     Head: Normocephalic and atraumatic.     Right Ear: External ear normal.     Left Ear: External ear normal.     Mouth/Throat:     Pharynx: No oropharyngeal exudate.  Eyes:     Conjunctiva/sclera: Conjunctivae normal.     Pupils: Pupils are equal, round, and reactive to light.  Cardiovascular:     Rate and Rhythm: Normal rate and regular  rhythm.     Heart sounds: Normal heart sounds.  Pulmonary:     Effort: Pulmonary effort is normal.     Breath sounds: Normal breath sounds.  Abdominal:     General: Bowel sounds are normal.     Palpations: Abdomen is soft.  Musculoskeletal:        General: No tenderness.     Cervical back: Normal range of motion and neck supple.     Right lower leg: No edema.     Left lower leg: No edema.  Skin:    General: Skin is warm and  dry.  Neurological:     Mental Status: He is alert and oriented to person, place, and time.     Labs reviewed: Basic Metabolic Panel: Recent Labs    03/17/23 1300 03/18/23 0406 03/19/23 0405 03/22/23 1143 04/06/23 0958 05/15/23 0923  NA 138 137   < > 133* 138 136  K 4.5 4.1   < > 3.5 4.2 4.8  CL 103 105   < > 95* 98 100  CO2 20* 23   < > 28 29 28   GLUCOSE 189* 113*   < > 134* 119* 115*  BUN 9 15   < > 10 7 15   CREATININE 0.93 1.16   < > 1.06 0.79 0.85  CALCIUM  7.5* 7.3*   < > 8.2* 9.4 9.5  MG 1.3* 1.8  --   --   --   --    < > = values in this interval not displayed.   Liver Function Tests: Recent Labs    03/04/23 0830 03/18/23 0406 04/06/23 0958  AST 29 23 20   ALT 19 13 11   ALKPHOS 83 41  --   BILITOT 0.7 0.6 0.4  PROT 7.2 5.2* 6.6  ALBUMIN  3.6 3.1*  --    Recent Labs    03/18/23 0406  AMYLASE 21*   No results for input(s): AMMONIA in the last 8760 hours. CBC: Recent Labs    12/03/22 1055 03/04/23 0830 03/20/23 0354 04/06/23 0958 05/15/23 0923  WBC 5.4   < > 7.4 6.1 5.0  NEUTROABS 3,629  --   --  4,148 2,960  HGB 14.5   < > 8.9* 11.6* 14.1  HCT 41.7   < > 26.6* 35.1* 41.6  MCV 99.5   < > 99.6 100.3* 101.5*  PLT 139*   < > 102* 324 195   < > = values in this interval not displayed.   Lipid Panel: Recent Labs    12/03/22 1055 03/20/23 0354  CHOL 183 108  HDL 51 41  LDLCALC 109* 50  TRIG 118 87  CHOLHDL 3.6 2.6   TSH: No results for input(s): TSH in the last 8760 hours. A1C: Lab Results  Component Value  Date   HGBA1C 6.2 (H) 03/04/2023     Assessment/Plan 1. Primary hypertension -Blood pressure well controlled, goal bp <140/90 Continue current medications and dietary modifications follow metabolic panel - telmisartan  (MICARDIS ) 40 MG tablet; Take 1 tablet (40 mg total) by mouth daily.  Dispense: 90 tablet; Refill: 1 - COMPLETE METABOLIC PANEL WITH GFR - CBC with Differential/Platelet  2. Type 2 diabetes mellitus with other specified complication, without long-term current use of insulin  (HCC) (Primary) -on dietary modifications only to control diabetes encouraged dietary compliance, routine foot care/monitoring and to keep up with diabetic eye exams through ophthalmology  - Hemoglobin A1c  3. Benign prostatic hyperplasia with nocturia -not controlled, with nocturia, will increase flomax  to see if this helps improves symptoms He continues to follow up with with urology - tamsulosin  (FLOMAX ) 0.4 MG CAPS capsule; Take 2 capsules (0.8 mg total) by mouth at bedtime.  Dispense: 90 capsule; Refill: 3  4. Tobacco abuse Encouraged cessation  5. Alcohol abuse Encouraged cessation  6. PAD Symptoms stable, continues on ASA  Return in about 6 months (around 03/13/2024) for routine follow up .  Tachina Spoonemore K. Karl BODILY [facility] [phone]

## 2023-09-14 NOTE — Patient Instructions (Addendum)
 Increase flomax  to 2 tablets- take at bedtime  Complete cologuard kit please.   Continues to work on cutting back from drinking alcohol and stopping if you are able.   Continues to work on smoking cessation   Ophthalmology [facility] eye care Address: [address] [place], [place], [place] [address] [phone]

## 2023-09-14 NOTE — Progress Notes (Signed)
 This encounter was created in error - please disregard.

## 2023-09-15 DIAGNOSIS — H04123 Dry eye syndrome of bilateral lacrimal glands: Secondary | ICD-10-CM | POA: Diagnosis not present

## 2023-09-15 DIAGNOSIS — H2513 Age-related nuclear cataract, bilateral: Secondary | ICD-10-CM | POA: Diagnosis not present

## 2023-09-15 LAB — CBC WITH DIFFERENTIAL/PLATELET
Absolute Lymphocytes: 1033 {cells}/uL (ref 850–3900)
Absolute Monocytes: 857 {cells}/uL (ref 200–950)
Basophils Absolute: 41 {cells}/uL (ref 0–200)
Basophils Relative: 1 %
Eosinophils Absolute: 78 {cells}/uL (ref 15–500)
Eosinophils Relative: 1.9 %
HCT: 40.4 % (ref 38.5–50.0)
Hemoglobin: 13.9 g/dL (ref 13.2–17.1)
MCH: 34.2 pg — ABNORMAL HIGH (ref 27.0–33.0)
MCHC: 34.4 g/dL (ref 32.0–36.0)
MCV: 99.3 fL (ref 80.0–100.0)
MPV: 9.9 fL (ref 7.5–12.5)
Monocytes Relative: 20.9 %
Neutro Abs: 2091 {cells}/uL (ref 1500–7800)
Neutrophils Relative %: 51 %
Platelets: 181 10*3/uL (ref 140–400)
RBC: 4.07 10*6/uL — ABNORMAL LOW (ref 4.20–5.80)
RDW: 13.6 % (ref 11.0–15.0)
Total Lymphocyte: 25.2 %
WBC: 4.1 10*3/uL (ref 3.8–10.8)

## 2023-09-15 LAB — COMPLETE METABOLIC PANEL WITH GFR
AG Ratio: 1.6 (calc) (ref 1.0–2.5)
ALT: 23 U/L (ref 9–46)
AST: 28 U/L (ref 10–35)
Albumin: 4.2 g/dL (ref 3.6–5.1)
Alkaline phosphatase (APISO): 78 U/L (ref 35–144)
BUN: 19 mg/dL (ref 7–25)
CO2: 25 mmol/L (ref 20–32)
Calcium: 9.3 mg/dL (ref 8.6–10.3)
Chloride: 102 mmol/L (ref 98–110)
Creat: 0.93 mg/dL (ref 0.70–1.35)
Globulin: 2.7 g/dL (ref 1.9–3.7)
Glucose, Bld: 106 mg/dL (ref 65–139)
Potassium: 3.8 mmol/L (ref 3.5–5.3)
Sodium: 138 mmol/L (ref 135–146)
Total Bilirubin: 0.6 mg/dL (ref 0.2–1.2)
Total Protein: 6.9 g/dL (ref 6.1–8.1)
eGFR: 92 mL/min/{1.73_m2} (ref >=60)

## 2023-09-15 LAB — HEMOGLOBIN A1C
Hgb A1c MFr Bld: 6.3 %{Hb} — ABNORMAL HIGH (ref <5.7)
Mean Plasma Glucose: 134 mg/dL
eAG (mmol/L): 7.4 mmol/L

## 2023-09-15 LAB — HM DIABETES EYE EXAM

## 2023-09-16 ENCOUNTER — Telehealth: Payer: Self-pay

## 2023-09-16 NOTE — Telephone Encounter (Signed)
-----   Message from Harlene MARLA An sent at 09/15/2023  6:18 PM EST ----- It doesn't say, can we call the office and let them know he has an hx of DM and ask if there was retinopathy on exam ----- Message ----- From: Suellen Devin BROCKS, CMA Sent: 09/15/2023   4:48 PM EST To: Harlene MARLA An, NP  Did not see an indication of positive or negative for retinopathy

## 2023-09-16 NOTE — Telephone Encounter (Signed)
 Usually eye doctors ask patients if they are diabetic or the patients should inform the doctor.   Called [employer], receptionist stated she could not see where it was documented that patient is diabetic, however transferred me to the clinical line and I left a detailed message asking if someone could resubmit fax that shows documentation of patient having ot not having diabetic retinopathy.

## 2023-10-23 ENCOUNTER — Ambulatory Visit (INDEPENDENT_AMBULATORY_CARE_PROVIDER_SITE_OTHER): Payer: 59 | Admitting: Nurse Practitioner

## 2023-10-23 ENCOUNTER — Encounter: Payer: Self-pay | Admitting: Nurse Practitioner

## 2023-10-23 VITALS — BP 137/88 | HR 82 | Temp 97.7°F | Resp 18 | Ht 72.0 in | Wt 199.8 lb

## 2023-10-23 DIAGNOSIS — M545 Low back pain, unspecified: Secondary | ICD-10-CM

## 2023-10-23 MED ORDER — PREDNISONE 10 MG (21) PO TBPK
ORAL_TABLET | ORAL | 0 refills | Status: DC
Start: 2023-10-23 — End: 2024-03-07

## 2023-10-23 NOTE — Patient Instructions (Addendum)
Take prednisone dose pack Add tylenol 1000 mg of tylenol every 8 hours as needed pain Heating pad- at least 3 times daily to back  Can use muscle rub as needed- use AFTER heat  Follow up if symptoms fail to improve or worsen

## 2023-10-23 NOTE — Progress Notes (Signed)
Careteam: Patient Care Team: Sharon Seller, NP as PCP - General (Geriatric Medicine) Pricilla Riffle, MD as PCP - Cardiology (Cardiology) Bridgett Larsson, LCSW as Social Worker (Licensed Clinical Social Worker) [facility]   PLACE OF SERVICE: [facility]  Advanced Directive information Does Patient Have a Medical Advance Directive?: No, Would patient like information on creating a medical advance directive?: No - Patient declined   Allergies  Allergen Reactions   Morphine And Codeine Rash     Chief Complaint  Patient presents with   Back Pain    back pain for around 2 weeks family member concerned about related heart or other complications     HPI: Patient is a 63 y.o. male presents for acute visit.   Patient reports back pain for a couple of days but girlfriend states it has been a few weeks.  Patient states pain was not present on last office visit (1/6). Back pain is on left side only, feels sore. Pain is constant, varies in intensity. Does not radiate. Worse after waking up in the morning, hard to move initially but then pain eases up. Has been using heat and it has helps some. Acetaminophen doesn't help.  Denies heavy lifting. Girlfriend reports patient had a fall but unsure when, doesn't remember if it was before or after onset of pain but does not associate fall with pain.   Pain is 9/10 when he wakes up or tries to move.   Denies urinary symptoms besides baseline frequency, reports taking flomax.  Denies new respiratory symptoms, is a smoker, cough is at baseline.  Denies changes in ADLs.   Review of Systems:  Review of Systems  Constitutional:  Negative for chills, fever and weight loss.  Respiratory:  Positive for cough. Negative for sputum production and shortness of breath.   Cardiovascular:  Negative for chest pain and palpitations.  Gastrointestinal:  Negative for abdominal pain, constipation, diarrhea, nausea and vomiting.  Genitourinary:   Positive for frequency. Negative for dysuria, hematuria and urgency.  Musculoskeletal:  Positive for back pain.  Neurological:  Negative for dizziness, weakness and headaches.    Past Medical History:  Diagnosis Date   Coronary artery disease    S/p NSTEMI 02/2020 >> s/p CABG // Echocardiogram 6/21: EF 55-60, Gr 2 DD, LAE   Diabetes mellitus Type 2    Essential hypertension    ETOH abuse    Hx of NSTEMI in 02/2020    Hyperlipidemia    L subclavian stenosis    Peripheral Arterial Disease    Tobacco abuse     Past Surgical History:  Procedure Laterality Date   ABDOMINAL AORTOGRAM W/LOWER EXTREMITY Bilateral 07/16/2020   Procedure: ABDOMINAL AORTOGRAM W/LOWER EXTREMITY;  Surgeon: Runell Gess, MD;  Location: [facility] INVASIVE CV LAB;  Service: Cardiovascular;  Laterality: Bilateral;   AORTA - BILATERAL FEMORAL ARTERY BYPASS GRAFT N/A 03/17/2023   Procedure: AORTOBIFEMORAL BYPASS GRAFT USING X HEMASHIELD GOLD VASCULAR GRAFT, LIGATION OF INTERNAL MESENTERIC ARTERY;  Surgeon: Maeola Harman, MD;  Location: [facility] OR;  Service: Vascular;  Laterality: N/A;   CORONARY ARTERY BYPASS GRAFT N/A 02/17/2020   Procedure: CORONARY ARTERY BYPASS GRAFTING (CABG) x Three, using left internal mammry artery and right leg greater saphenous vein harvested endoscopically;  Surgeon: Kerin Perna, MD;  Location: [facility] OR;  Service: Open Heart Surgery;  Laterality: N/A;   LEFT HEART CATH AND CORONARY ANGIOGRAPHY N/A 02/16/2020   Procedure: LEFT HEART CATH AND CORONARY ANGIOGRAPHY;  Surgeon: Runell Gess, MD;  Location: [facility] INVASIVE CV LAB;  Service: Cardiovascular;  Laterality: N/A;   TEE WITHOUT CARDIOVERSION N/A 02/17/2020   Procedure: TRANSESOPHAGEAL ECHOCARDIOGRAM (TEE);  Surgeon: Donata Clay, Theron Arista, MD;  Location: [facility] OR;  Service: Open Heart Surgery;  Laterality: N/A;   TONSILECTOMY, ADENOIDECTOMY, BILATERAL MYRINGOTOMY AND TUBES     unsure of all of that but + tonsilectomy     Social History:    reports that he has been smoking cigarettes. He has a 25 pack-year smoking history. He has never used smokeless tobacco. He reports current alcohol use. He reports that he does not use drugs.  Family History  Problem Relation Age of Onset   Heart attack Mother    Heart disease Father    Heart attack Father    Heart failure Father    Aneurysm Father    Healthy Brother    Heart disease Paternal Uncle      Medications:  Patient's Medications  New Prescriptions   PREDNISONE (STERAPRED UNI-PAK 21 TAB) 10 MG (21) TBPK TABLET    Use as directed  Previous Medications   ASPIRIN EC 81 MG TABLET    Take 1 tablet (81 mg total) by mouth daily. Swallow whole.   ATORVASTATIN (LIPITOR) 20 MG TABLET    Take 1 tablet (20 mg total) by mouth daily.   TAMSULOSIN (FLOMAX) 0.4 MG CAPS CAPSULE    Take 2 capsules (0.8 mg total) by mouth at bedtime.   TELMISARTAN (MICARDIS) 40 MG TABLET    Take 1 tablet (40 mg total) by mouth daily.  Modified Medications   No medications on file  Discontinued Medications   No medications on file     Physical Exam:  Vitals:   10/23/23 1243  BP: 137/88  Pulse: 82  Resp: 18  Temp: 97.7 F (36.5 C)  SpO2: 92%  Weight: 199 lb 12.8 oz (90.6 kg)  Height: 6' (1.829 m)   Body mass index is 27.1 kg/m.  Wt Readings from Last 3 Encounters:  10/23/23 199 lb 12.8 oz (90.6 kg)  09/14/23 191 lb 3.2 oz (86.7 kg)  05/14/23 181 lb (82.1 kg)     Physical Exam Constitutional:      Appearance: Normal appearance.  Cardiovascular:     Rate and Rhythm: Normal rate and regular rhythm.  Pulmonary:     Effort: Pulmonary effort is normal. No respiratory distress.  Abdominal:     General: Bowel sounds are normal.     Palpations: Abdomen is soft.  Musculoskeletal:        General: Tenderness (left mid-back) present.       Back:  Skin:    General: Skin is warm and dry.     Findings: Bruising (on left lateral side) present.  Neurological:     Mental Status: He is alert and  oriented to person, place, and time.  Psychiatric:        Mood and Affect: Mood normal.        Behavior: Behavior normal.     Labs reviewed: Basic Metabolic Panel:  Recent Labs    03/17/23 1300 03/18/23 0406 03/19/23 0405 04/06/23 0958 05/15/23 0923 09/14/23 1415  NA 138 137   < > 138 136 138  K 4.5 4.1   < > 4.2 4.8 3.8  CL 103 105   < > 98 100 102  CO2 20* 23   < > 29 28 25   GLUCOSE 189* 113*   < > 119* 115* 106  BUN 9 15   < > 7 15 19   CREATININE 0.93 1.16   < > 0.79 0.85 0.93  CALCIUM 7.5* 7.3*   < > 9.4 9.5 9.3  MG 1.3* 1.8  --   --   --   --    < > = values in this interval not displayed.   Liver Function Tests:  Recent Labs    03/04/23 0830 03/18/23 0406 04/06/23 0958 09/14/23 1415  AST 29 23 20 28   ALT 19 13 11 23   ALKPHOS 83 41  --   --   BILITOT 0.7 0.6 0.4 0.6  PROT 7.2 5.2* 6.6 6.9  ALBUMIN 3.6 3.1*  --   --     Recent Labs    03/18/23 0406  AMYLASE 21*   No results for input(s): "AMMONIA" in the last 8760 hours. CBC:  Recent Labs    04/06/23 0958 05/15/23 0923 09/14/23 1415  WBC 6.1 5.0 4.1  NEUTROABS 4,148 2,960 2,091  HGB 11.6* 14.1 13.9  HCT 35.1* 41.6 40.4  MCV 100.3* 101.5* 99.3  PLT 324 195 181   Lipid Panel:  Recent Labs    12/03/22 1055 03/20/23 0354  CHOL 183 108  HDL 51 41  LDLCALC 109* 50  TRIG 118 87  CHOLHDL 3.6 2.6   TSH: No results for input(s): "TSH" in the last 8760 hours. A1C:  Lab Results  Component Value Date   HGBA1C 6.3 (H) 09/14/2023     Assessment/Plan  1. Acute left-sided low back pain without sciatica (Primary) - pain ongoing despite tylenol and heat at home.  -appears musculoskeletal  -predniSONE (STERAPRED UNI-PAK 21 TAB) 10 MG (21) TBPK tablet; Use as directed  Dispense: 21 tablet; Refill: 0 -Start steroid pack, avoid all other NSAIDs -May take 1000 mg of acetaminophen every 8 hours as needed for pain -May use muscle rub AFTER heat -Encouraged decreasing alcohol use -Continue heating pad at  least 3 times daily to affected side -Notify if new symptoms accompanying back pain or worsening  Rollen Sox, [facility] MSN-FNP Student -I personally was present during the history, physical exam and medical decision-making activities of this service and have verified that the service and findings are accurately documented in the student's note Kyleah [facility] [phone]

## 2023-11-02 ENCOUNTER — Telehealth: Payer: Self-pay | Admitting: *Deleted

## 2023-11-02 DIAGNOSIS — M545 Low back pain, unspecified: Secondary | ICD-10-CM

## 2023-11-02 NOTE — Telephone Encounter (Signed)
 Patient's girlfriend, Wandalee Ferdinand called and stated that patient has finished the Prednisone Taper and his back pain is NO better.   Please Advise.

## 2023-11-03 NOTE — Telephone Encounter (Signed)
 Please call the patient back and get more information- has the pain changed or still the same? Any other symptoms?

## 2023-11-03 NOTE — Telephone Encounter (Signed)
 Let patients friend Wandalee Ferdinand) know about referral for physical therapy and tylenol, She verbalized understanding.

## 2023-11-03 NOTE — Telephone Encounter (Signed)
 Patients girlfriend [name] states that the pain is still the same. The prednisone helped but once he ran out of medication and the pain came back. No other symptoms

## 2023-11-03 NOTE — Telephone Encounter (Signed)
 I have placed a referral for physical therapy at this time He can continue tylenol as needed for pain and to use heat

## 2023-12-04 ENCOUNTER — Ambulatory Visit (INDEPENDENT_AMBULATORY_CARE_PROVIDER_SITE_OTHER): Admitting: Family

## 2023-12-04 ENCOUNTER — Encounter: Payer: Self-pay | Admitting: Family

## 2023-12-04 ENCOUNTER — Ambulatory Visit: Payer: Self-pay | Admitting: *Deleted

## 2023-12-04 VITALS — BP 132/80 | HR 88 | Temp 97.6°F | Resp 19 | Ht 72.0 in | Wt 194.2 lb

## 2023-12-04 DIAGNOSIS — R109 Unspecified abdominal pain: Secondary | ICD-10-CM

## 2023-12-04 DIAGNOSIS — R140 Abdominal distension (gaseous): Secondary | ICD-10-CM | POA: Diagnosis not present

## 2023-12-04 DIAGNOSIS — F1010 Alcohol abuse, uncomplicated: Secondary | ICD-10-CM | POA: Diagnosis not present

## 2023-12-04 NOTE — Telephone Encounter (Signed)
  Chief Complaint: per patient girlfriend on DPR, abdominal distention , bloating, weight gain Symptoms: abdominal distention worse than normal moderate to severe per GF. Difficulty buttoning coat. Reports weight gain from July 175 lbs - last OV 10/23/23 200 lbs. Back pain and therapy has not contacted patient Frequency: na Pertinent Negatives: Patient denies difficulty breathing now no vomiting, no abdominal pain, no blood in stool, no yellowing of eyes  Disposition: [] ED /[] Urgent Care (no appt availability in office) / [x] Appointment(In office/virtual)/ []  [facility]/ [] Home Care/ [] Refused Recommended Disposition /[] [place] [facility]/ []  Follow-up with PCP Additional Notes:    Appt scheduled today with other provider in office. None available with PCP.         Copied from CRM [id number]. Topic: Clinical - Red Word Triage >> Dec 04, 2023  8:18 AM Irine Seal wrote: [facility] that prompted transfer to Nurse Triage: Dan Europe( friend on DPR)  stated that the patient has had persistent  back pain and has not been contacted by physical therapy. Their biggest concern is the patient's abdominal distention, and signs of difficulty breathing. They are worried that something more is going on related to the patient's liver, The patient has a history of alcoholism and wants to be seen in the office. Reason for Disposition  [1] MODERATE-SEVERE SWELLING of abdomen (e.g., looks very distended or swollen) AND [2] NEW-onset or much worse  Answer Assessment - Initial Assessment Questions 1. SYMPTOM: "What's the main symptom you're concerned about?" (e.g., abdomen bloating, swelling)     Abdominal bloating , swelling , weight gain per patient girlfriend on DPR 2. ONSET: "When did sx  start?"     Girlfriend noted abdominal distention worsening when patient not able to button coat 3. SEVERITY: "How bad is the bloating or swelling?"    - BLOATING: Feels gassy or bloated. No visible swelling.      - MILD SWELLING: Feels gassy or bloated. Abdomen looks mildly distended or swollen.    - MODERATE - SEVERE SWELLING: Abdomen looks very distended or swollen.      Moderate to severe swelling  4. ABDOMEN PAIN:  "Is there any abdomen pain?" If Yes, ask: "How bad is the pain?"  (e.g., Scale 1-10; mild, moderate, or severe)   - NONE (0): No pain.   - MILD (1-3): Doesn't interfere with normal activities, abdomen soft and not tender to touch.    - MODERATE (4-7): Interferes with normal activities or awakens from sleep, abdomen tender to touch.    - SEVERE (8-10): Excruciating pain, doubled over, unable to do any normal activities.       Denies  5. RELIEVING AND AGGRAVATING FACTORS: "What makes it better or worse?" (e.g., certain foods, lactose, medicines)     na 6. GI HISTORY: "Do you have any history of stomach or intestine problems?" (e.g., bowel obstruction, cancer, irritable bowel)      Hx alcoholism 7. CAUSE: "What do you think is causing the bloating?"      "Ascites" 8. OTHER SYMPTOMS: "Do you have any other symptoms?" (e.g., belching, blood in stool, breathing difficulty, constipation, diarrhea, fever, passing gas, vomiting, weight loss, white of eyes have turned yellow)     Urinating frequency weight gain  9. PREGNANCY: "Is there any chance you are pregnant?" "When was your last menstrual period?"     na  Protocols used: Abdomen Bloating and Swelling-A-AH

## 2023-12-04 NOTE — Progress Notes (Signed)
 Provider: Adryan Shin FNP-C  Sharon Seller, NP  Patient Care Team: Sharon Seller, NP as PCP - General (Geriatric Medicine) Pricilla Riffle, MD as PCP - Cardiology (Cardiology) Bridgett Larsson, LCSW as Social Worker (Licensed Clinical Social Worker) [facility]  Extended Emergency Contact Information Primary Emergency Contact: Graham,[name] Mobile Phone: [phone] Relation: Significant other Preferred language: English Interpreter needed? No Secondary Emergency Contact: Colan [place] Home Phone: [phone] Mobile Phone: [phone] Relation: Mother  Code Status:  Full Code  Goals of care: Advanced Directive information    12/04/2023   11:20 AM  Advanced Directives  Does Patient Have a Medical Advance Directive? No  Would patient like information on creating a medical advance directive? No - Patient declined     Chief Complaint  Patient presents with   Acute Visit    Abdominal distention with weight gain.    Discussed the use of AI scribe software for clinical note transcription with the patient, who gave verbal consent to proceed.  History of Present Illness   Karl Garcia is a 63 year old male with a history of aortic surgery who presents with persistent left upper back pain and abdominal distention. Here with girlfriend who provides additional HPI information.   He has been experiencing persistent back pain for the past two to three months, which began following a Holiday representative accident. The pain is localized to the left flank area and does not radiate to the legs. It varies in intensity and is sometimes exacerbated by movements such as getting up from a chair or walking. He has not undergone any imaging studies like x-rays for this issue. He rates the pain on a scale of zero to ten, depending on the day.  He also reports abdominal distention and a sensation of bloating for the past couple of months.  There is no associated abdominal pain, nausea, vomiting, or changes in bowel movements. He describes the sensation as feeling 'swollen' and 'overweight'. He has a history of significant alcohol consumption, specifically vodka, which he acknowledges as 'way too much every day'.  He has a history of aortic surgery, after which he noticed swelling in the abdominal area. No fever, chills, or tenderness in the abdomen. He also reports frequent urination, which has been ongoing and for which he is on prostate medication. No burning or blood in the urine.  No nausea, vomiting, abdominal pain, tenderness, radiation of pain to the legs, swelling in the legs, or issues with urination aside from frequency. No fever or chills.   Past Medical History:  Diagnosis Date   Coronary artery disease    S/p NSTEMI 02/2020 >> s/p CABG // Echocardiogram 6/21: EF 55-60, Gr 2 DD, LAE   Diabetes mellitus Type 2    Essential hypertension    ETOH abuse    Hx of NSTEMI in 02/2020    Hyperlipidemia    L subclavian stenosis    Peripheral Arterial Disease    Tobacco abuse    Past Surgical History:  Procedure Laterality Date   ABDOMINAL AORTOGRAM W/LOWER EXTREMITY Bilateral 07/16/2020   Procedure: ABDOMINAL AORTOGRAM W/LOWER EXTREMITY;  Surgeon: Runell Gess, MD;  Location: [facility];  Service: Cardiovascular;  Laterality: Bilateral;   AORTA - BILATERAL FEMORAL ARTERY BYPASS GRAFT N/A 03/17/2023   Procedure: AORTOBIFEMORAL BYPASS GRAFT USING X HEMASHIELD GOLD VASCULAR GRAFT, LIGATION OF INTERNAL MESENTERIC ARTERY;  Surgeon: Maeola Harman, MD;  Location: [facility] OR;  Service: Vascular;  Laterality: N/A;   CORONARY ARTERY BYPASS GRAFT N/A 02/17/2020   Procedure: CORONARY ARTERY BYPASS GRAFTING (CABG) x Three, using left internal mammry artery and right leg greater saphenous vein harvested endoscopically;  Surgeon: Kerin Perna, MD;  Location: [facility] OR;  Service: Open Heart Surgery;  Laterality: N/A;    LEFT HEART CATH AND CORONARY ANGIOGRAPHY N/A 02/16/2020   Procedure: LEFT HEART CATH AND CORONARY ANGIOGRAPHY;  Surgeon: Runell Gess, MD;  Location: [facility];  Service: Cardiovascular;  Laterality: N/A;   TEE WITHOUT CARDIOVERSION N/A 02/17/2020   Procedure: TRANSESOPHAGEAL ECHOCARDIOGRAM (TEE);  Surgeon: Donata Clay, Theron Arista, MD;  Location: [facility] OR;  Service: Open Heart Surgery;  Laterality: N/A;   TONSILECTOMY, ADENOIDECTOMY, BILATERAL MYRINGOTOMY AND TUBES     unsure of all of that but + tonsilectomy    Allergies  Allergen Reactions   Morphine And Codeine Rash    Outpatient Encounter Medications as of 12/04/2023  Medication Sig   aspirin EC 81 MG tablet Take 1 tablet (81 mg total) by mouth daily. Swallow whole.   atorvastatin (LIPITOR) 20 MG tablet Take 1 tablet (20 mg total) by mouth daily.   tamsulosin (FLOMAX) 0.4 MG CAPS capsule Take 2 capsules (0.8 mg total) by mouth at bedtime.   telmisartan (MICARDIS) 40 MG tablet Take 1 tablet (40 mg total) by mouth daily.   predniSONE (STERAPRED UNI-PAK 21 TAB) 10 MG (21) TBPK tablet Use as directed (Patient not taking: Reported on 12/04/2023)   No facility-administered encounter medications on file as of 12/04/2023.    Review of Systems  Constitutional:  Negative for appetite change, chills, fatigue, fever and unexpected weight change.  HENT:  Negative for congestion, dental problem and ear discharge.   Eyes:  Negative for pain, discharge, redness, itching and visual disturbance.  Respiratory:  Negative for cough, chest tightness, shortness of breath and wheezing.   Cardiovascular:  Negative for chest pain, palpitations and leg swelling.  Gastrointestinal:  Positive for abdominal distention. Negative for abdominal pain, blood in stool, constipation, diarrhea, nausea and vomiting.  Genitourinary:  Positive for frequency. Negative for difficulty urinating, dysuria, flank pain and urgency.       Chronic urine frequency due to BPH    Musculoskeletal:  Positive for back pain. Negative for arthralgias, gait problem, joint swelling, myalgias, neck pain and neck stiffness.       Left flank pain   Skin:  Negative for color change, pallor, rash and wound.  Neurological:  Negative for dizziness, weakness, light-headedness, numbness and headaches.  Hematological:  Does not bruise/bleed easily.  Psychiatric/Behavioral:  Negative for agitation, behavioral problems, confusion, hallucinations, self-injury, sleep disturbance and suicidal ideas. The patient is not nervous/anxious.     Immunization History  Administered Date(s) Administered   Janssen (J&J) SARS-COV-2 Vaccination 02/25/2020   Tdap 09/24/2013   Pertinent  Health Maintenance Due  Topic Date Due   INFLUENZA VACCINE  12/07/2023 (Originally 04/09/2023)      03/06/2023    9:39 AM 04/23/2023    2:38 PM 05/15/2023    9:02 AM 09/14/2023    1:43 PM 10/23/2023    1:15 PM  Fall Risk  Falls in the past year? 0 0 0 0 0  Was there an injury with Fall? 0 0 0 0 0  Fall Risk Category Calculator 0 0 0 0 0  Patient at Risk for Falls Due to No Fall Risks No Fall Risks No Fall Risks  No Fall Risks  Fall risk Follow up Falls  evaluation completed Falls evaluation completed   Falls evaluation completed   Functional Status Survey:    Vitals:   12/04/23 1121  BP: 132/80  Pulse: 88  Resp: 19  Temp: 97.6 F (36.4 C)  SpO2: 94%  Weight: 194 lb 3.2 oz (88.1 kg)  Height: 6' (1.829 m)   Body mass index is 26.34 kg/m. Physical Exam  VITALS: P- 88, BP- 132/80 GENERAL: Alert, cooperative, well developed, no acute distress. HEENT: Normocephalic, normal oropharynx, moist mucous membranes. CHEST: Clear to auscultation bilaterally, no wheezes, rhonchi, or crackles. CARDIOVASCULAR: Normal heart rate and rhythm, S1 and S2 normal without murmurs. ABDOMEN: Soft, non-tender, distended, with mid-line bulging without organomegaly, normal bowel sounds. EXTREMITIES: No cyanosis or  edema. MUSCULOSKELETAL: No CVA tenderness in the back. NEUROLOGICAL: Cranial nerves grossly intact, moves all extremities without gross motor or sensory deficit. SKIN: Old surgical incision from chest down to Abdomen healed and intact PSYCHIATRY/BEHAVIORAL: Mood stable      Labs reviewed: Recent Labs    03/17/23 1300 03/18/23 0406 03/19/23 0405 04/06/23 0958 05/15/23 0923 09/14/23 1415  NA 138 137   < > 138 136 138  K 4.5 4.1   < > 4.2 4.8 3.8  CL 103 105   < > 98 100 102  CO2 20* 23   < > 29 28 25   GLUCOSE 189* 113*   < > 119* 115* 106  BUN 9 15   < > 7 15 19   CREATININE 0.93 1.16   < > 0.79 0.85 0.93  CALCIUM 7.5* 7.3*   < > 9.4 9.5 9.3  MG 1.3* 1.8  --   --   --   --    < > = values in this interval not displayed.   Recent Labs    03/04/23 0830 03/18/23 0406 04/06/23 0958 09/14/23 1415  AST 29 23 20 28   ALT 19 13 11 23   ALKPHOS 83 41  --   --   BILITOT 0.7 0.6 0.4 0.6  PROT 7.2 5.2* 6.6 6.9  ALBUMIN 3.6 3.1*  --   --    Recent Labs    04/06/23 0958 05/15/23 0923 09/14/23 1415  WBC 6.1 5.0 4.1  NEUTROABS 4,148 2,960 2,091  HGB 11.6* 14.1 13.9  HCT 35.1* 41.6 40.4  MCV 100.3* 101.5* 99.3  PLT 324 195 181   Lab Results  Component Value Date   TSH 1.563 02/17/2020   Lab Results  Component Value Date   HGBA1C 6.3 (H) 09/14/2023   Lab Results  Component Value Date   CHOL 108 03/20/2023   HDL 41 03/20/2023   LDLCALC 50 03/20/2023   TRIG 87 03/20/2023   CHOLHDL 2.6 03/20/2023    Significant Diagnostic Results in last 30 days:  No results found.  Assessment/Plan    Back Pain Persistent back pain for 2-3 months, primarily in the left flank area, worsens with certain movements such as getting up from a chair. No radiation to the legs. 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 Differential includes muscular pain and possible sciatic nerve involvement, though the pain is higher than typical for sciatica. - Order CT scan of the abdomen to evaluate  abdominal distention and rule out other causes of back pain - Consider x-ray of the back if CT scan does not provide sufficient information  Abdominal Distention Abdominal distention and bloating for a couple of months without associated pain, nausea, vomiting, or changes in bowel movements. Significant alcohol consumption raises concern for liver involvement, though  previous liver function tests were normal. Differential includes liver enlargement due to alcohol use, post-surgical changes, or other abdominal pathology. - Order CT scan of the abdomen to evaluate the cause of abdominal distention - Order lab work to check liver enzymes and kidney function  Alcohol Use Disorder Significant daily alcohol consumption, specifically vodka. He acknowledges drinking excessively. No current desire for counseling or medication to assist with cessation. Discussed the importance of reducing alcohol intake and offered support for cessation. - Discuss the importance of reducing alcohol intake and offer support for cessation - Reassess willingness for counseling or medication to assist with alcohol cessation in future visits  Benign Prostatic Hyperplasia (BPH) Frequent urination, including nocturia, managed with prostate medication. No burning or hematuria. Symptoms are ongoing.  General Health Maintenance Routine health maintenance discussed. - Schedule follow-up appointment in July for routine visit and lab work with Shanda Bumps  Follow-up Follow-up plans for imaging and lab work. - Schedule CT scan at [facility] - Perform lab work today to check liver enzymes and kidney function - Schedule follow-up appointment with Shanda Bumps in July     Family/ staff Communication: Reviewed plan of care with patient and girlFriend verbalized understanding   Labs/tests ordered:  - CBC/diff - CMP   Next Appointment: Return in about 3 months (around 03/14/2024) for medical mangement of chronic issues with PCP  .   Total time: 20 minutes. Greater than 50% of total time spent doing patient education regarding Abdominal distention, back pain,BPH ,Alcohol abuse ,health maintenance including symptom/medication management.   Caesar Bookman, NP

## 2023-12-05 LAB — COMPLETE METABOLIC PANEL WITHOUT GFR
AG Ratio: 1.6 (calc) (ref 1.0–2.5)
ALT: 50 U/L — ABNORMAL HIGH (ref 9–46)
AST: 55 U/L — ABNORMAL HIGH (ref 10–35)
Albumin: 3.9 g/dL (ref 3.6–5.1)
Alkaline phosphatase (APISO): 73 U/L (ref 35–144)
BUN: 12 mg/dL (ref 7–25)
CO2: 27 mmol/L (ref 20–32)
Calcium: 8.7 mg/dL (ref 8.6–10.3)
Chloride: 100 mmol/L (ref 98–110)
Creat: 1 mg/dL (ref 0.70–1.35)
Globulin: 2.5 g/dL (ref 1.9–3.7)
Glucose, Bld: 105 mg/dL (ref 65–139)
Potassium: 3.8 mmol/L (ref 3.5–5.3)
Sodium: 138 mmol/L (ref 135–146)
Total Bilirubin: 0.5 mg/dL (ref 0.2–1.2)
Total Protein: 6.4 g/dL (ref 6.1–8.1)

## 2023-12-05 LAB — CBC WITH DIFFERENTIAL/PLATELET
Absolute Lymphocytes: 1646 {cells}/uL (ref 850–3900)
Absolute Monocytes: 806 {cells}/uL (ref 200–950)
Basophils Absolute: 62 {cells}/uL (ref 0–200)
Basophils Relative: 1.3 %
Eosinophils Absolute: 139 {cells}/uL (ref 15–500)
Eosinophils Relative: 2.9 %
HCT: 41.5 % (ref 38.5–50.0)
Hemoglobin: 14.3 g/dL (ref 13.2–17.1)
MCH: 34.8 pg — ABNORMAL HIGH (ref 27.0–33.0)
MCHC: 34.5 g/dL (ref 32.0–36.0)
MCV: 101 fL — ABNORMAL HIGH (ref 80.0–100.0)
MPV: 10.2 fL (ref 7.5–12.5)
Monocytes Relative: 16.8 %
Neutro Abs: 2146 {cells}/uL (ref 1500–7800)
Neutrophils Relative %: 44.7 %
Platelets: 177 10*3/uL (ref 140–400)
RBC: 4.11 10*6/uL — ABNORMAL LOW (ref 4.20–5.80)
RDW: 13.2 % (ref 11.0–15.0)
Total Lymphocyte: 34.3 %
WBC: 4.8 10*3/uL (ref 3.8–10.8)

## 2023-12-11 ENCOUNTER — Telehealth: Payer: Self-pay

## 2023-12-11 NOTE — Telephone Encounter (Signed)
 Thank you :)

## 2023-12-11 NOTE — Telephone Encounter (Signed)
 Form Printed and completed.Please faxed requested notes for 12/04/2023.

## 2023-12-11 NOTE — Telephone Encounter (Signed)
 Jaquita Rector A  Ngetich, Dinah C, NP30 minutes ago (11:57 AM)   [facility] Forms have been faxed and conformation have been received that it was successful.

## 2023-12-11 NOTE — Telephone Encounter (Signed)
 A duplicate report was received from [employer] marked URGENT requesting clinical info prior to CT of Abdomen and Pelvis. Report is under media and was sent to [facility] on 12/08/23 by Michaell Cowing, RMA   The info being requested has not been done from what I'm able to access in patients chart.  Please advise

## 2023-12-15 ENCOUNTER — Other Ambulatory Visit: Payer: Self-pay

## 2023-12-15 DIAGNOSIS — R748 Abnormal levels of other serum enzymes: Secondary | ICD-10-CM

## 2023-12-15 DIAGNOSIS — R7989 Other specified abnormal findings of blood chemistry: Secondary | ICD-10-CM

## 2023-12-17 ENCOUNTER — Other Ambulatory Visit: Payer: Self-pay

## 2023-12-17 DIAGNOSIS — R7989 Other specified abnormal findings of blood chemistry: Secondary | ICD-10-CM

## 2023-12-17 DIAGNOSIS — R748 Abnormal levels of other serum enzymes: Secondary | ICD-10-CM

## 2023-12-18 ENCOUNTER — Encounter: Payer: Self-pay | Admitting: Family

## 2023-12-18 ENCOUNTER — Other Ambulatory Visit

## 2023-12-19 LAB — HEPATIC FUNCTION PANEL
AG Ratio: 1.6 (calc) (ref 1.0–2.5)
ALT: 52 U/L — ABNORMAL HIGH (ref 9–46)
AST: 57 U/L — ABNORMAL HIGH (ref 10–35)
Albumin: 4.1 g/dL (ref 3.6–5.1)
Alkaline phosphatase (APISO): 74 U/L (ref 35–144)
Bilirubin, Direct: 0.2 mg/dL (ref 0.0–0.2)
Globulin: 2.6 g/dL (ref 1.9–3.7)
Indirect Bilirubin: 0.6 mg/dL (ref 0.2–1.2)
Total Bilirubin: 0.8 mg/dL (ref 0.2–1.2)
Total Protein: 6.7 g/dL (ref 6.1–8.1)

## 2023-12-19 LAB — VITAMIN B12: Vitamin B-12: 378 pg/mL (ref 200–1100)

## 2023-12-24 ENCOUNTER — Ambulatory Visit
Admission: RE | Admit: 2023-12-24 | Discharge: 2023-12-24 | Disposition: A | Discharge status: Home / Self Care | Source: Ambulatory Visit | Attending: Family | Admitting: Family

## 2023-12-24 DIAGNOSIS — R109 Unspecified abdominal pain: Secondary | ICD-10-CM

## 2023-12-24 DIAGNOSIS — R140 Abdominal distension (gaseous): Secondary | ICD-10-CM

## 2023-12-24 MED ORDER — IOPAMIDOL (ISOVUE-300) INJECTION 61%
80.0000 mL | Freq: Once | INTRAVENOUS | Status: AC | PRN
  Administered 2023-12-24: 80 mL via INTRAVENOUS

## 2023-12-31 ENCOUNTER — Other Ambulatory Visit: Payer: Self-pay | Admitting: Internal Medicine

## 2023-12-31 DIAGNOSIS — R748 Abnormal levels of other serum enzymes: Secondary | ICD-10-CM

## 2023-12-31 DIAGNOSIS — R109 Unspecified abdominal pain: Secondary | ICD-10-CM

## 2024-01-04 ENCOUNTER — Encounter: Payer: Self-pay | Admitting: Physician Assistant

## 2024-01-07 ENCOUNTER — Encounter: Payer: Self-pay | Admitting: Nurse Practitioner

## 2024-01-07 ENCOUNTER — Telehealth: Payer: Self-pay

## 2024-01-07 NOTE — Telephone Encounter (Signed)
 I left a message for the patient to return my call.

## 2024-01-07 NOTE — Telephone Encounter (Signed)
-----   Message from Verma Gobble sent at 01/07/2024  1:02 PM EDT ----- Abdominal imaging without acute findings of abdomen or pelvis.  Small fluid collection but should not be causing symptoms of pain

## 2024-02-21 ENCOUNTER — Other Ambulatory Visit: Payer: Self-pay | Admitting: Internal Medicine

## 2024-02-25 NOTE — Progress Notes (Signed)
 02/26/2024 FALLOU HULBERT [id number] [birth year]  Referring provider: Verma Gobble, NP Primary GI doctor: Dr. Venice Gillis  ASSESSMENT AND PLAN:  Bloating/discomfort daily  x 1 year started after the aortofemoral bypass with Dr. Vikki Graves 03/2023 No weight loss, per GF appetite changes, gets full quickly No GERD, no excessive gas, no nausea, vomiting, dysphagia 12/24/2023 CT abdomen pelvis with contrast for distention showed no definite acute findings, aortofemoral bypass small fluid collection associated left iliac limb anastomosis, small right pleural effusion Chronic bloating post-aortic bypass, possibly due to gastroparesis from vagal nerve manipulation versus muscle wall versus other but over all lack of GI symptoms and no significant CT findings. - Provide information on gastroparesis and dietary modifications. - start on omeprazole 40 mg daily - Consider endoscopy if no improvement, declines at this time, would need to get clearance - suggest follow up with vascular surgery for fluid around anasmatomosis  Screening colon Never had colonoscopy, no family history No changes in bowel habits Has cologuard at home, declines colonoscopy, will get if positive  Elevated LFTs in setting of alcohol abuse Isolated AST/ALT elevation since 12/04/2023 CT abdomen pelvis 12/24/2023 unremarkable liver, no bile duct changes    Latest Ref Rng & Units 12/18/2023    8:09 AM 12/04/2023   11:48 AM 09/14/2023    2:15 PM  Hepatic Function  Total Protein 6.1 - 8.1 g/dL 6.7  6.4  6.9   AST 10 - 35 U/L 57  55  28   ALT 9 - 46 U/L 52  50  23   Total Bilirubin 0.2 - 1.2 mg/dL 0.8  0.5  0.6   Bilirubin, Direct 0.0 - 0.2 mg/dL 0.2      Platelets 440  INR 03/17/2023 1.2  Started on Lipitor  12/09/2022 No NSAIDS Drink liqueur 2-3 drinks a day for years, 2-3 shots per drink, can be more Tobacco use Acute elevation likely due to chronic alcohol use and atorvastatin . No cirrhosis or significant liver damage on CT.  Differential includes hepatitis or other liver pathology. - Order right upper quadrant ultrasound. - Check labs for hepatitis and other causes, get ELF for fibrosis - Consider endoscopy if symptoms persist. - advised to stop drinking  CAD EF 55-60, normal wall motion, mild LVH, GRII DD, normal RV SF, moderate LAE, trivial MR  Status post bypass June 2021 No chest pain  Peripheral arterial disease history of tobacco use  Status post left subclavian stenosis   Patient Care Team: Verma Gobble, NP as PCP - General (Geriatric Medicine) Elmyra Haggard, MD as PCP - Cardiology (Cardiology) Adriana Albany, LCSW as Social Worker (Licensed Clinical Social Worker) [facility]  HISTORY OF PRESENT ILLNESS: 63 y.o. male with a past medical history listed below presents as a new patient for evaluation of flank pain and elevated LFTs.   Discussed the use of AI scribe software for clinical note transcription with the patient, who gave verbal consent to proceed.  History of Present Illness   DRAYLON MERCADEL is a 63 year old male who presents with elevated liver function tests and abdominal bloating.  He has experienced abdominal bloating and discomfort for about a year, describing it as a feeling of tightness and fullness rather than pain. This sensation is present daily and began after his aortic bypass surgery in July of the previous year. The bloating is not particularly affected by the time of day or meals.  No significant changes in bowel habits, such as  blood in the stool, dark stools, or changes in frequency. No nausea, vomiting, or significant weight changes recently. However, he mentions a decreased appetite, stating that he gets full quickly and does not eat large meals, although he can consume a regular-sized breakfast.  His medical history includes an aortic bypass surgery and a history of elevated liver function tests. He has been on atorvastatin  for cholesterol  management since April 2024, along with medications for blood pressure and prostate health. A recent CT scan from April 17th showed a small fluid collection around the aorta-femoral bypass anastomosis and a right pleural effusion. No recent fevers, chills, or antibiotic use. He is currently taking acetaminophen  for aches and pains and denies the use of NSAIDs like ibuprofen or Aleve.  Social history reveals significant alcohol use, with current consumption of two to three mixed drinks daily, each containing approximately two shots of liquor. He also has a history of smoking. No family history of colon cancer reported.      He  reports that he has been smoking cigarettes. He has a 25 pack-year smoking history. He has never used smokeless tobacco. He reports current alcohol use. He reports that he does not use drugs.  RELEVANT GI HISTORY, IMAGING AND LABS: Results   LABS Liver function: Elevated (11/2023)  RADIOLOGY CT abdomen: No definite acute findings. Aorta femoral bypass with small fluid collection around anastomosis. Right pleural effusion. (12/24/2023)      CBC    Component Value Date/Time   WBC 4.8 12/04/2023 1148   RBC 4.11 (L) 12/04/2023 1148   HGB 14.3 12/04/2023 1148   HGB 12.4 (L) 06/21/2020 1102   HCT 41.5 12/04/2023 1148   HCT 35.7 (L) 06/21/2020 1102   PLT 177 12/04/2023 1148   PLT 199 06/21/2020 1102   MCV 101.0 (H) 12/04/2023 1148   MCV 93 06/21/2020 1102   MCH 34.8 (H) 12/04/2023 1148   MCHC 34.5 12/04/2023 1148   RDW 13.2 12/04/2023 1148   RDW 13.7 06/21/2020 1102   LYMPHSABS 1,195 05/15/2023 0923   LYMPHSABS 1.4 06/21/2020 1102   MONOABS 1.2 (H) 09/23/2013 1835   EOSABS 139 12/04/2023 1148   EOSABS 0.2 06/21/2020 1102   BASOSABS 62 12/04/2023 1148   BASOSABS 0.1 06/21/2020 1102   Recent Labs    03/17/23 1151 03/17/23 1300 03/18/23 0406 03/19/23 0405 03/19/23 1712 03/20/23 0354 04/06/23 0958 05/15/23 0923 09/14/23 1415 12/04/23 1148  HGB 11.2*  11.6* 10.4* 8.3* 9.5* 8.9* 11.6* 14.1 13.9 14.3    CMP     Component Value Date/Time   NA 138 12/04/2023 1148   NA 141 06/27/2020 1649   K 3.8 12/04/2023 1148   CL 100 12/04/2023 1148   CO2 27 12/04/2023 1148   GLUCOSE 105 12/04/2023 1148   BUN 12 12/04/2023 1148   BUN 10 06/27/2020 1649   CREATININE 1.00 12/04/2023 1148   CALCIUM  8.7 12/04/2023 1148   PROT 6.7 12/18/2023 0809   PROT 6.6 04/17/2020 1341   ALBUMIN  3.1 (L) 03/18/2023 0406   ALBUMIN  4.1 04/17/2020 1341   AST 57 (H) 12/18/2023 0809   ALT 52 (H) 12/18/2023 0809   ALKPHOS 41 03/18/2023 0406   BILITOT 0.8 12/18/2023 0809   BILITOT 0.5 04/17/2020 1341   GFRNONAA >60 03/22/2023 1143   GFRAA 120 06/27/2020 1649      Latest Ref Rng & Units 12/18/2023    8:09 AM 12/04/2023   11:48 AM 09/14/2023    2:15 PM  Hepatic Function  Total Protein  6.1 - 8.1 g/dL 6.7  6.4  6.9   AST 10 - 35 U/L 57  55  28   ALT 9 - 46 U/L 52  50  23   Total Bilirubin 0.2 - 1.2 mg/dL 0.8  0.5  0.6   Bilirubin, Direct 0.0 - 0.2 mg/dL 0.2         Current Medications:   Current Outpatient Medications (Endocrine & Metabolic):    predniSONE  (STERAPRED UNI-PAK 21 TAB) 10 MG (21) TBPK tablet, Use as directed (Patient not taking: Reported on 02/26/2024)  Current Outpatient Medications (Cardiovascular):    atorvastatin  (LIPITOR ) 20 MG tablet, TAKE 1 TABLET BY MOUTH DAILY   telmisartan  (MICARDIS ) 40 MG tablet, Take 1 tablet (40 mg total) by mouth daily.   Current Outpatient Medications (Analgesics):    aspirin  EC 81 MG tablet, Take 1 tablet (81 mg total) by mouth daily. Swallow whole. (Patient not taking: Reported on 02/26/2024)   Current Outpatient Medications (Other):    omeprazole (PRILOSEC) 40 MG capsule, Take 1 capsule (40 mg total) by mouth daily before breakfast.   tamsulosin  (FLOMAX ) 0.4 MG CAPS capsule, Take 2 capsules (0.8 mg total) by mouth at bedtime.  Medical History:  Past Medical History:  Diagnosis Date   Coronary artery  disease    S/p NSTEMI 02/2020 >> s/p CABG // Echocardiogram 6/21: EF 55-60, Gr 2 DD, LAE   Diabetes mellitus Type 2    Essential hypertension    ETOH abuse    Hx of NSTEMI in 02/2020    Hyperlipidemia    L subclavian stenosis    Peripheral Arterial Disease    Tobacco abuse    Allergies:  Allergies  Allergen Reactions   Morphine  And Codeine Rash     Surgical History:  He  has a past surgical history that includes Tonsilectomy, adenoidectomy, bilateral myringotomy and tubes; LEFT HEART CATH AND CORONARY ANGIOGRAPHY (N/A, 02/16/2020); Coronary artery bypass graft (N/A, 02/17/2020); TEE without cardioversion (N/A, 02/17/2020); ABDOMINAL AORTOGRAM W/LOWER EXTREMITY (Bilateral, 07/16/2020); and Aorta - bilateral femoral artery bypass graft (N/A, 03/17/2023). Family History:  His family history includes Aneurysm in his father; Healthy in his brother; Heart attack in his father and mother; Heart disease in his father and paternal uncle; Heart failure in his father.  REVIEW OF SYSTEMS  : All other systems reviewed and negative except where noted in the History of Present Illness.  PHYSICAL EXAM: BP 118/70   Pulse 81   Ht 6' (1.829 m)   Wt 194 lb (88 kg)   BMI 26.31 kg/m  Physical Exam   GENERAL APPEARANCE: Well nourished, in no apparent distress. HEENT: No cervical lymphadenopathy, unremarkable thyroid, sclerae anicteric, conjunctiva pink. RESPIRATORY: Respiratory effort normal, breath sounds equal bilaterally without rales, rhonchi, or wheezing. CARDIO: Regular rate and rhythm with no murmurs, rubs, or gallops, peripheral pulses intact. ABDOMEN: Soft, non-distended, active bowel sounds in all four quadrants, tenderness in right upper quadrant, small hernia present, no rebound, no mass appreciated. RECTAL: Declines. MUSCULOSKELETAL: Full range of motion, normal gait, without edema. SKIN: Dry, intact without rashes or lesions. No jaundice. NEURO: Alert, oriented, no focal deficits. PSYCH:  Cooperative, normal mood and affect.      Edmonia Gottron, PA-C 11:48 AM

## 2024-02-26 ENCOUNTER — Other Ambulatory Visit (INDEPENDENT_AMBULATORY_CARE_PROVIDER_SITE_OTHER)

## 2024-02-26 ENCOUNTER — Encounter: Payer: Self-pay | Admitting: Physician Assistant

## 2024-02-26 ENCOUNTER — Ambulatory Visit: Admitting: Physician Assistant

## 2024-02-26 VITALS — BP 118/70 | HR 81 | Ht 72.0 in | Wt 194.0 lb

## 2024-02-26 DIAGNOSIS — F17210 Nicotine dependence, cigarettes, uncomplicated: Secondary | ICD-10-CM

## 2024-02-26 DIAGNOSIS — R7989 Other specified abnormal findings of blood chemistry: Secondary | ICD-10-CM | POA: Diagnosis not present

## 2024-02-26 DIAGNOSIS — Z720 Tobacco use: Secondary | ICD-10-CM

## 2024-02-26 DIAGNOSIS — Z951 Presence of aortocoronary bypass graft: Secondary | ICD-10-CM

## 2024-02-26 DIAGNOSIS — R1011 Right upper quadrant pain: Secondary | ICD-10-CM

## 2024-02-26 DIAGNOSIS — F1010 Alcohol abuse, uncomplicated: Secondary | ICD-10-CM

## 2024-02-26 DIAGNOSIS — R140 Abdominal distension (gaseous): Secondary | ICD-10-CM

## 2024-02-26 DIAGNOSIS — I739 Peripheral vascular disease, unspecified: Secondary | ICD-10-CM

## 2024-02-26 LAB — CBC WITH DIFFERENTIAL/PLATELET
Basophils Absolute: 0 10*3/uL (ref 0.0–0.1)
Basophils Relative: 0.9 % (ref 0.0–3.0)
Eosinophils Absolute: 0.1 10*3/uL (ref 0.0–0.7)
Eosinophils Relative: 1.6 % (ref 0.0–5.0)
HCT: 43.9 % (ref 39.0–52.0)
Hemoglobin: 15 g/dL (ref 13.0–17.0)
Lymphocytes Relative: 17.1 % (ref 12.0–46.0)
Lymphs Abs: 0.8 10*3/uL (ref 0.7–4.0)
MCHC: 34.2 g/dL (ref 30.0–36.0)
MCV: 102.7 fl — ABNORMAL HIGH (ref 78.0–100.0)
Monocytes Absolute: 0.6 10*3/uL (ref 0.1–1.0)
Monocytes Relative: 11.5 % (ref 3.0–12.0)
Neutro Abs: 3.3 10*3/uL (ref 1.4–7.7)
Neutrophils Relative %: 68.9 % (ref 43.0–77.0)
Platelets: 164 10*3/uL (ref 150.0–400.0)
RBC: 4.28 Mil/uL (ref 4.22–5.81)
RDW: 14.4 % (ref 11.5–15.5)
WBC: 4.8 10*3/uL (ref 4.0–10.5)

## 2024-02-26 LAB — HEPATIC FUNCTION PANEL
ALT: 31 U/L (ref 0–53)
AST: 31 U/L (ref 0–37)
Albumin: 4.1 g/dL (ref 3.5–5.2)
Alkaline Phosphatase: 81 U/L (ref 39–117)
Bilirubin, Direct: 0.2 mg/dL (ref 0.0–0.3)
Total Bilirubin: 0.9 mg/dL (ref 0.2–1.2)
Total Protein: 7.1 g/dL (ref 6.0–8.3)

## 2024-02-26 LAB — IBC + FERRITIN
Ferritin: 327.8 ng/mL — ABNORMAL HIGH (ref 22.0–322.0)
Iron: 100 ug/dL (ref 42–165)
Saturation Ratios: 35.5 % (ref 20.0–50.0)
TIBC: 281.4 ug/dL (ref 250.0–450.0)
Transferrin: 201 mg/dL — ABNORMAL LOW (ref 212.0–360.0)

## 2024-02-26 LAB — BASIC METABOLIC PANEL WITH GFR
BUN: 12 mg/dL (ref 6–23)
CO2: 30 meq/L (ref 19–32)
Calcium: 9.2 mg/dL (ref 8.4–10.5)
Chloride: 98 meq/L (ref 96–112)
Creatinine, Ser: 0.85 mg/dL (ref 0.40–1.50)
GFR: 91.42 mL/min (ref >60.00)
Glucose, Bld: 149 mg/dL — ABNORMAL HIGH (ref 70–99)
Potassium: 3.8 meq/L (ref 3.5–5.1)
Sodium: 136 meq/L (ref 135–145)

## 2024-02-26 MED ORDER — OMEPRAZOLE 40 MG PO CPDR: 40.0000 mg | DELAYED_RELEASE_CAPSULE | Freq: Every day | ORAL | 3 refills | Status: AC

## 2024-02-26 NOTE — Patient Instructions (Signed)
 Your provider has requested that you go to the basement level for lab work before leaving today. Press B on the elevator. The lab is located at the first door on the left as you exit the elevator.  Get cologuard at home  Please take your proton pump inhibitor medication, omeprazole 20 mg  Please take this medication 30 minutes to 1 hour before meals- this makes it more effective.  Avoid spicy and acidic foods Avoid fatty foods Limit your intake of coffee, tea, alcohol, and carbonated drinks Work to maintain a healthy weight Keep the head of the bed elevated at least 3 inches with blocks or a wedge pillow if you are having any nighttime symptoms Stay upright for 2 hours after eating Avoid meals and snacks three to four hours before bedtime Stop smoking  Gastroparesis Please do small frequent meals like 4-6 meals a day.  Eat and drink liquids at separate times.  Avoid high fiber foods, cook your vegetables, avoid high fat food.  Suggest spreading protein throughout the day (greek yogurt, glucerna, soft meat, milk, eggs) Choose soft foods that you can mash with a fork When you are more symptomatic, change to pureed foods foods and liquids.  Consider reading Living well with Gastroparesis by Creasie Doctor Gastroparesis is a condition in which food takes longer than normal to empty from the stomach. This condition is also known as delayed gastric emptying. It is usually a long-term (chronic) condition. There is no cure, but there are treatments and things that you can do at home to help relieve symptoms. Treating the underlying condition that causes gastroparesis can also help relieve symptoms What are the causes? In many cases, the cause of this condition is not known. Possible causes include: A hormone (endocrine) disorder, such as hypothyroidism or diabetes. A nervous system disease, such as Parkinson's disease or multiple sclerosis. Cancer, infection, or surgery that affects the  stomach or vagus nerve. The vagus nerve runs from your chest, through your neck, and to the lower part of your brain. A connective tissue disorder, such as scleroderma. Certain medicines. What increases the risk? You are more likely to develop this condition if: You have certain disorders or diseases. These may include: An endocrine disorder. An eating disorder. Amyloidosis. Scleroderma. Parkinson's disease. Multiple sclerosis. Cancer or infection of the stomach or the vagus nerve. You have had surgery on your stomach or vagus nerve. You take certain medicines. You are male. What are the signs or symptoms? Symptoms of this condition include: Feeling full after eating very little or a loss of appetite. Nausea, vomiting, or heartburn. Bloating of your abdomen. Inconsistent blood sugar (glucose) levels on blood tests. Unexplained weight loss. Acid from the stomach coming up into the esophagus (gastroesophageal reflux). Sudden tightening (spasm) of the stomach, which can be painful. Symptoms may come and go. Some people may not notice any symptoms. How is this diagnosed? This condition is diagnosed with tests, such as: Tests that check how long it takes food to move through the stomach and intestines. These tests include: Upper gastrointestinal (GI) series. For this test, you drink a liquid that shows up well on X-rays, and then X-rays are taken of your intestines. Gastric emptying scintigraphy. For this test, you eat food that contains a small amount of radioactive material, and then scans are taken. Wireless capsule GI monitoring system. For this test, you swallow a pill (capsule) that records information about how foods and fluid move through your stomach. Gastric manometry. For this test,  a tube is passed down your throat and into your stomach to measure electrical and muscular activity. Endoscopy. For this test, a long, thin tube with a camera and light on the end is passed down  your throat and into your stomach to check for problems in your stomach lining. Ultrasound. This test uses sound waves to create images of the inside of your body. This can help rule out gallbladder disease or pancreatitis as a cause of your symptoms. How is this treated? There is no cure for this condition, but treatment and home care may relieve symptoms. Treatment may include: Treating the underlying cause. Managing your symptoms by making changes to your diet and exercise habits. Taking medicines to control nausea and vomiting and to stimulate stomach muscles. Getting food through a feeding tube in the hospital. This may be done in severe cases. Having surgery to insert a device called a gastric electrical stimulator into your body. This device helps improve stomach emptying and control nausea and vomiting. Follow these instructions at home: Take over-the-counter and prescription medicines only as told by your health care provider. Follow instructions from your health care provider about eating or drinking restrictions. Your health care provider may recommend that you: Eat smaller meals more often. Eat low-fat foods. Eat low-fiber forms of high-fiber foods. For example, eat cooked vegetables instead of raw vegetables. Have only liquid foods instead of solid foods. Liquid foods are easier to digest. Drink enough fluid to keep your urine pale yellow. Exercise as often as told by your health care provider. Keep all follow-up visits. This is important. Contact a health care provider if you: Notice that your symptoms do not improve with treatment. Have new symptoms. Get help right away if you: Have severe pain in your abdomen that does not improve with treatment. Have nausea that is severe or does not go away. Vomit every time you drink fluids. Summary Gastroparesis is a long-term (chronic) condition in which food takes longer than normal to empty from the stomach. Symptoms include nausea,  vomiting, heartburn, bloating of your abdomen, and loss of appetite. Eating smaller portions, low-fat foods, and low-fiber forms of high-fiber foods may help you manage your symptoms. Get help right away if you have severe pain in your abdomen. This information is not intended to replace advice given to you by your health care provider. Make sure you discuss any questions you have with your health care provider. Document Revised: 01/02/2020 Document Reviewed: 01/02/2020 Elsevier Patient Education  2021 [employer]  You have been scheduled for an abdominal ultrasound at [facility] (1st floor of hospital) on 03/05/2024 at 9 am. Go through Emergency room entrance tell them you are there for an outpatient imaging study. Please arrive 30 minutes prior to your appointment for registration. Make certain not to have anything to eat or drink 8 hours prior to your appointment. Should you need to reschedule your appointment, please contact radiology at [phone]. This test typically takes about 30 minutes to perform.  Due to recent changes in healthcare laws, you may see the results of your imaging and laboratory studies on MyChart before your provider has had a chance to review them.  We understand that in some cases there may be results that are confusing or concerning to you. Not all laboratory results come back in the same time frame and the provider may be waiting for multiple results in order to interpret others.  Please give us  48 hours in order for your provider to  thoroughly review all the results before contacting the office for clarification of your results.    I appreciate the  opportunity to care for you  Thank You   [facility]

## 2024-02-27 ENCOUNTER — Encounter (HOSPITAL_COMMUNITY): Payer: Self-pay

## 2024-02-27 ENCOUNTER — Other Ambulatory Visit: Payer: Self-pay

## 2024-02-27 ENCOUNTER — Emergency Department (HOSPITAL_COMMUNITY)
Admission: EM | Admit: 2024-02-27 | Discharge: 2024-02-28 | Disposition: A | Discharge status: Home / Self Care | Attending: Emergency Medicine | Admitting: Emergency Medicine

## 2024-02-27 DIAGNOSIS — W01198A Fall on same level from slipping, tripping and stumbling with subsequent striking against other object, initial encounter: Secondary | ICD-10-CM | POA: Insufficient documentation

## 2024-02-27 DIAGNOSIS — S0990XA Unspecified injury of head, initial encounter: Secondary | ICD-10-CM | POA: Diagnosis present

## 2024-02-27 DIAGNOSIS — W19XXXA Unspecified fall, initial encounter: Secondary | ICD-10-CM

## 2024-02-27 DIAGNOSIS — F10129 Alcohol abuse with intoxication, unspecified: Secondary | ICD-10-CM | POA: Insufficient documentation

## 2024-02-27 DIAGNOSIS — Y92017 Garden or yard in single-family (private) house as the place of occurrence of the external cause: Secondary | ICD-10-CM | POA: Diagnosis not present

## 2024-02-27 DIAGNOSIS — S0091XA Abrasion of unspecified part of head, initial encounter: Secondary | ICD-10-CM | POA: Diagnosis not present

## 2024-02-27 DIAGNOSIS — Y908 Blood alcohol level of 240 mg/100 ml or more: Secondary | ICD-10-CM | POA: Insufficient documentation

## 2024-02-27 MED ORDER — LACTATED RINGERS IV BOLUS
1000.0000 mL | Freq: Once | INTRAVENOUS | Status: AC
  Administered 2024-02-27: 1000 mL via INTRAVENOUS

## 2024-02-27 NOTE — ED Provider Notes (Signed)
 Karl Garcia EMERGENCY DEPARTMENT AT [facility] Provider Note   CSN: [id number] Arrival date & time: 02/27/24  2343     History Chief Complaint  Patient presents with   Fall    HPI Karl Garcia is a 63 y.o. male presenting for chief complaint of ground-level fall.  He states he had multiple alcoholic beverages tonight, lost his balance fell hit the right side of his head. Denies fevers chills nausea vomiting shortness of breath.  Patient's recorded medical, surgical, social, medication list and allergies were reviewed in the Snapshot window as part of the initial history.   Review of Systems   Review of Systems  Constitutional:  Negative for chills and fever.  HENT:  Negative for ear pain and sore throat.   Eyes:  Negative for pain and visual disturbance.  Respiratory:  Negative for cough and shortness of breath.   Cardiovascular:  Negative for chest pain and palpitations.  Gastrointestinal:  Negative for abdominal pain and vomiting.  Genitourinary:  Negative for dysuria and hematuria.  Musculoskeletal:  Negative for arthralgias and back pain.  Skin:  Negative for color change and rash.  Neurological:  Negative for seizures and syncope.  All other systems reviewed and are negative.   Physical Exam Updated Vital Signs BP (!) 158/88   Pulse 87   Temp 98.1 F (36.7 C)   Resp 20   Ht 6' (1.829 m)   Wt 87 kg   SpO2 95%   BMI 26.01 kg/m  Physical Exam Vitals and nursing note reviewed.  Constitutional:      General: He is not in acute distress.    Appearance: He is well-developed.  HENT:     Head: Normocephalic and atraumatic.   Eyes:     Conjunctiva/sclera: Conjunctivae normal.    Cardiovascular:     Rate and Rhythm: Normal rate and regular rhythm.     Heart sounds: No murmur heard. Pulmonary:     Effort: Pulmonary effort is normal. No respiratory distress.     Breath sounds: Normal breath sounds.  Abdominal:     Palpations: Abdomen is soft.      Tenderness: There is no abdominal tenderness.   Musculoskeletal:        General: Signs of injury present. No swelling.     Cervical back: Neck supple.   Skin:    General: Skin is warm and dry.     Capillary Refill: Capillary refill takes less than 2 seconds.   Neurological:     Mental Status: He is alert.   Psychiatric:        Mood and Affect: Mood normal.      ED Course/ Medical Decision Making/ A&P    Procedures Procedures   Medications Ordered in ED Medications  lactated ringers  bolus 1,000 mL (0 mLs Intravenous Stopped 02/28/24 0331)   Medical Decision Making:    Karl Garcia is a 63 y.o. male who presented to the ED today with a moderate mechanisma trauma, detailed above.    Patient placed on continuous vitals and telemetry monitoring while in ED which was reviewed periodically.   Given this mechanism of trauma, a full physical exam was performed. Notably, patient was HDS in NAD.   Reviewed and confirmed nursing documentation for past medical history, family history, social history.    Initial Assessment/Plan:   This is a patient presenting with a moderate mechanism trauma.  As such, I have considered intracranial injuries including intracranial hemorrhage, intrathoracic injuries including blunt  myocardial or blunt lung injury, blunt abdominal injuries including aortic dissection, bladder injury, spleen injury, liver injury and I have considered orthopedic injuries including extremity or spinal injury.  With the patient's presentation of moderate mechanism trauma but an otherwise reassuring exam, patient warrants targeted evaluation for potential traumatic injuries. Will proceed with targeted evaluation for potential injuries. Will proceed with CT Head, CXR, PXR. Objective evaluation resulted with NAA.   Final Reassessment and Plan:   Patient very intoxicated during my initial evaluation, observed for 6 hours.  His wife came in to be with him.  At the end of the  observation window he was ambulatory tolerating p.o. intake, stated that he felt much better would like to be discharged soon as possible.  Wife will drive him home. Reevaluated, still no laceration appreciated.  He does have multiple abrasions but nothing that will require repair at this time. Patient stable for outpatient care management.   Disposition:  I have considered need for hospitalization, however, considering all of the above, I believe this patient is stable for discharge at this time.  Patient/family educated about specific return precautions for given chief complaint and symptoms.  Patient/family educated about follow-up with PCP.     Patient/family expressed understanding of return precautions and need for follow-up. Patient spoken to regarding all imaging and laboratory results and appropriate follow up for these results. All education provided in verbal form with additional information in written form. Time was allowed for answering of patient questions. Patient discharged.    Emergency Department Medication Summary:   Medications  lactated ringers  bolus 1,000 mL (0 mLs Intravenous Stopped 02/28/24 0331)          Clinical Impression:  1. Fall, initial encounter      Discharge   Final Clinical Impression(s) / ED Diagnoses Final diagnoses:  Fall, initial encounter    Rx / DC Orders ED Discharge Orders     None         Karl Meth, MD 02/28/24 2247

## 2024-02-27 NOTE — ED Triage Notes (Addendum)
 Patient BIB [facility] from home due to fall. Patient was in backyard having a picnic when he fell. Family stated to [facility] that he had LOC. Patient hit head, not on thinners.  Patient states he drank a couple drinks tonight. Patient states he drinks a couple drinks everyday. Denies pain. Bleeding controlled at this time. O2 87% on room air with PTAR. Upon arrival O2 WNL on room air.

## 2024-02-28 ENCOUNTER — Other Ambulatory Visit (HOSPITAL_COMMUNITY)

## 2024-02-28 ENCOUNTER — Emergency Department (HOSPITAL_COMMUNITY)

## 2024-02-28 DIAGNOSIS — S0091XA Abrasion of unspecified part of head, initial encounter: Secondary | ICD-10-CM | POA: Diagnosis not present

## 2024-02-28 LAB — CBC WITH DIFFERENTIAL/PLATELET
Abs Immature Granulocytes: 0.06 10*3/uL (ref 0.00–0.07)
Basophils Absolute: 0.1 10*3/uL (ref 0.0–0.1)
Basophils Relative: 1 %
Eosinophils Absolute: 0.2 10*3/uL (ref 0.0–0.5)
Eosinophils Relative: 4 %
HCT: 37.8 % — ABNORMAL LOW (ref 39.0–52.0)
Hemoglobin: 13 g/dL (ref 13.0–17.0)
Immature Granulocytes: 1 %
Lymphocytes Relative: 34 %
Lymphs Abs: 1.7 10*3/uL (ref 0.7–4.0)
MCH: 35.9 pg — ABNORMAL HIGH (ref 26.0–34.0)
MCHC: 34.4 g/dL (ref 30.0–36.0)
MCV: 104.4 fL — ABNORMAL HIGH (ref 80.0–100.0)
Monocytes Absolute: 0.6 10*3/uL (ref 0.1–1.0)
Monocytes Relative: 12 %
Neutro Abs: 2.4 10*3/uL (ref 1.7–7.7)
Neutrophils Relative %: 48 %
Platelets: 160 10*3/uL (ref 150–400)
RBC: 3.62 MIL/uL — ABNORMAL LOW (ref 4.22–5.81)
RDW: 13.4 % (ref 11.5–15.5)
WBC: 5 10*3/uL (ref 4.0–10.5)
nRBC: 0 % (ref 0.0–0.2)

## 2024-02-28 LAB — BASIC METABOLIC PANEL WITH GFR
Anion gap: 13 (ref 5–15)
BUN: 11 mg/dL (ref 8–23)
CO2: 22 mmol/L (ref 22–32)
Calcium: 7.8 mg/dL — ABNORMAL LOW (ref 8.9–10.3)
Chloride: 105 mmol/L (ref 98–111)
Creatinine, Ser: 0.91 mg/dL (ref 0.61–1.24)
GFR, Estimated: >60 mL/min (ref >60)
Glucose, Bld: 126 mg/dL — ABNORMAL HIGH (ref 70–99)
Potassium: 3.4 mmol/L — ABNORMAL LOW (ref 3.5–5.1)
Sodium: 140 mmol/L (ref 135–145)

## 2024-02-28 NOTE — ED Notes (Addendum)
 Pt Placed on 2L O2 d/t dropping to 87% on RA.  Pt was upset when placed on O2 and trying to reposition him - stating if I did not lay his head down I would bot be breathing.  Pt has ETOH on board.

## 2024-02-29 ENCOUNTER — Ambulatory Visit: Payer: Self-pay | Admitting: Physician Assistant

## 2024-03-02 LAB — TISSUE TRANSGLUTAMINASE, IGA: (tTG) Ab, IgA: <1 U/mL

## 2024-03-02 LAB — ANTI-SMOOTH MUSCLE ANTIBODY, IGG: Actin (Smooth Muscle) Antibody (IGG): <20 U (ref <20)

## 2024-03-02 LAB — ANTI-NUCLEAR AB-TITER (ANA TITER)
ANA TITER: 1:40 {titer} — ABNORMAL HIGH
ANA Titer 1: 1:40 {titer} — ABNORMAL HIGH

## 2024-03-02 LAB — MITOCHONDRIAL ANTIBODIES: Mitochondrial M2 Ab, IgG: <=20 U (ref <=20.0)

## 2024-03-02 LAB — HEPATITIS C ANTIBODY: Hepatitis C Ab: NONREACTIVE

## 2024-03-02 LAB — IGA: Immunoglobulin A: 266 mg/dL (ref 70–320)

## 2024-03-02 LAB — HEPATITIS B SURFACE ANTIGEN: Hepatitis B Surface Ag: NONREACTIVE

## 2024-03-02 LAB — HEPATITIS A ANTIBODY, TOTAL: Hepatitis A AB,Total: NONREACTIVE

## 2024-03-02 LAB — ENHANCED LIVER FIBROSIS (ELF)

## 2024-03-02 LAB — HEPATITIS B SURFACE ANTIBODY,QUALITATIVE: Hep B S Ab: NONREACTIVE

## 2024-03-02 LAB — IGG: IgG (Immunoglobin G), Serum: 1147 mg/dL (ref 600–1540)

## 2024-03-02 LAB — ANA: Anti Nuclear Antibody (ANA): POSITIVE — AB

## 2024-03-05 ENCOUNTER — Ambulatory Visit (HOSPITAL_COMMUNITY)
Admission: RE | Admit: 2024-03-05 | Discharge: 2024-03-05 | Disposition: A | Discharge status: Home / Self Care | Source: Ambulatory Visit | Attending: Physician Assistant | Admitting: Physician Assistant

## 2024-03-05 DIAGNOSIS — R1011 Right upper quadrant pain: Secondary | ICD-10-CM | POA: Diagnosis present

## 2024-03-05 DIAGNOSIS — R7989 Other specified abnormal findings of blood chemistry: Secondary | ICD-10-CM | POA: Insufficient documentation

## 2024-03-07 ENCOUNTER — Encounter: Payer: Self-pay | Admitting: Nurse Practitioner

## 2024-03-07 ENCOUNTER — Ambulatory Visit (INDEPENDENT_AMBULATORY_CARE_PROVIDER_SITE_OTHER): Admitting: Nurse Practitioner

## 2024-03-07 ENCOUNTER — Other Ambulatory Visit: Payer: Self-pay

## 2024-03-07 VITALS — BP 127/87 | HR 75 | Temp 98.3°F | Resp 18 | Ht 72.0 in | Wt 197.0 lb

## 2024-03-07 DIAGNOSIS — R972 Elevated prostate specific antigen [PSA]: Secondary | ICD-10-CM | POA: Insufficient documentation

## 2024-03-07 DIAGNOSIS — I10 Essential (primary) hypertension: Secondary | ICD-10-CM | POA: Insufficient documentation

## 2024-03-07 DIAGNOSIS — I25810 Atherosclerosis of coronary artery bypass graft(s) without angina pectoris: Secondary | ICD-10-CM | POA: Insufficient documentation

## 2024-03-07 DIAGNOSIS — E1169 Type 2 diabetes mellitus with other specified complication: Secondary | ICD-10-CM | POA: Diagnosis not present

## 2024-03-07 DIAGNOSIS — F17200 Nicotine dependence, unspecified, uncomplicated: Secondary | ICD-10-CM

## 2024-03-07 DIAGNOSIS — R351 Nocturia: Secondary | ICD-10-CM

## 2024-03-07 DIAGNOSIS — N401 Enlarged prostate with lower urinary tract symptoms: Secondary | ICD-10-CM | POA: Insufficient documentation

## 2024-03-07 DIAGNOSIS — E119 Type 2 diabetes mellitus without complications: Secondary | ICD-10-CM | POA: Insufficient documentation

## 2024-03-07 DIAGNOSIS — I739 Peripheral vascular disease, unspecified: Secondary | ICD-10-CM

## 2024-03-07 NOTE — Assessment & Plan Note (Signed)
 Had follow up with urology in August 2024 but due to uncontrolled bp went to ED and was rescheduled however has not been back. Number provided to call and reschedule appt. Will follow up psa today.

## 2024-03-07 NOTE — Assessment & Plan Note (Signed)
 Encouraged cessation.

## 2024-03-07 NOTE — Assessment & Plan Note (Signed)
 Ongoing symptoms. To decrease flomax  to 0.8 mg at bedtime and make follow up with urologist

## 2024-03-07 NOTE — Assessment & Plan Note (Signed)
 Blood pressure well controlled, goal bp <140/90 Continue current medications and dietary modifications follow metabolic panel

## 2024-03-07 NOTE — Assessment & Plan Note (Signed)
 Due for follow up with vascular, to restart asa 81 mg daily

## 2024-03-07 NOTE — Patient Instructions (Addendum)
 [facility] [phone] Call to set up follow up with Dr Sheree  Urologist  Address: [address] [place], [place], [place] [address] Phone: [phone]  To get cologuard out and complete

## 2024-03-07 NOTE — Assessment & Plan Note (Signed)
 Stable without chest pains at this time. To restart ASA 81 mg daily.

## 2024-03-07 NOTE — Progress Notes (Signed)
 Careteam: Patient Care Team: Caro Harlene POUR, NP as PCP - General (Geriatric Medicine) Okey Vina GAILS, MD as PCP - Cardiology (Cardiology) Ezzard Rolin BIRCH, LCSW as Social Worker (Licensed Clinical Social Worker) [facility]  PLACE OF SERVICE:  [facility]  Advanced Directive information    Allergies  Allergen Reactions   Morphine  And Codeine Rash    Chief Complaint  Patient presents with   Medical Management of Chronic Issues     3 months follow-up. In addition needs to discuss tetanus, shingles, vaccine, Medicare Annual Wellness visit. Orders are pending for Lung cancer screening and Diabetic Kidney evaluation-Urine ACR    HPI:  Discussed the use of AI scribe software for clinical note transcription with the patient, who gave verbal consent to proceed.  History of Present Illness Karl Garcia is a 63 year old male with gastroparesis and peripheral arterial disease who presents for a routine follow-up.  He experiences ongoing abdominal bloating without associated pain or discomfort. There are no changes in bowel movements, which remain normal. An abdominal ultrasound was recently performed, by GI and results are pending. It was also recommend to follow up with vascular due to the gastroparesis. He was supposed to schedule follow up in April 2025 but this somehow was overlooked.   He experiences urinary frequency, including three to four times per night, and daytime frequency. He does not feel he is emptying his bladder completely. He has increased his Flomax  dosage to three pills daily without improvement. He has not seen a urologist recently, and his last PSA check was about a year ago, which was elevated.   He reports swelling in his ankles and numbness in his right foot, particularly from midfoot to toes. He has a history of coronary artery disease and peripheral arterial disease. No shortness of breath or chest pain noted.  He continues to smoke  full flavor cigarettes daily without desire to cut back. He had a lung cancer screening over a year ago, which was clean, due for another lung cancer screening- will order today  He has not completed a Cologuard test sent to him for colorectal cancer screening.  He is currently taking Lipitor  20 mg daily for cholesterol management and  He has stopped taking aspirin  but does not recall the reason.  Review of Systems:  Review of Systems  Constitutional:  Negative for chills, fever and weight loss.  HENT:  Negative for tinnitus.   Respiratory:  Negative for cough, sputum production and shortness of breath.   Cardiovascular:  Negative for chest pain, palpitations and leg swelling.  Gastrointestinal:  Negative for abdominal pain, constipation, diarrhea and heartburn.  Genitourinary:  Negative for dysuria, frequency and urgency.  Musculoskeletal:  Negative for back pain, falls, joint pain and myalgias.  Skin: Negative.   Neurological:  Negative for dizziness and headaches.  Psychiatric/Behavioral:  Negative for depression and memory loss. The patient does not have insomnia.     Past Medical History:  Diagnosis Date   Coronary artery disease    S/p NSTEMI 02/2020 >> s/p CABG // Echocardiogram 6/21: EF 55-60, Gr 2 DD, LAE   Diabetes mellitus Type 2    Essential hypertension    ETOH abuse    Hx of NSTEMI in 02/2020    Hyperlipidemia    L subclavian stenosis    NSTEMI (non-ST elevated myocardial infarction) (HCC) 02/16/2020   Peripheral Arterial Disease    Tobacco abuse    Past Surgical History:  Procedure  Laterality Date   ABDOMINAL AORTOGRAM W/LOWER EXTREMITY Bilateral 07/16/2020   Procedure: ABDOMINAL AORTOGRAM W/LOWER EXTREMITY;  Surgeon: Court Dorn PARAS, MD;  Location: [facility];  Service: Cardiovascular;  Laterality: Bilateral;   AORTA - BILATERAL FEMORAL ARTERY BYPASS GRAFT N/A 03/17/2023   Procedure: AORTOBIFEMORAL BYPASS GRAFT USING X HEMASHIELD GOLD VASCULAR  GRAFT, LIGATION OF INTERNAL MESENTERIC ARTERY;  Surgeon: Sheree Penne Bruckner, MD;  Location: [facility] OR;  Service: Vascular;  Laterality: N/A;   CORONARY ARTERY BYPASS GRAFT N/A 02/17/2020   Procedure: CORONARY ARTERY BYPASS GRAFTING (CABG) x Three, using left internal mammry artery and right leg greater saphenous vein harvested endoscopically;  Surgeon: Fleeta Hanford Coy, MD;  Location: [facility] OR;  Service: Open Heart Surgery;  Laterality: N/A;   LEFT HEART CATH AND CORONARY ANGIOGRAPHY N/A 02/16/2020   Procedure: LEFT HEART CATH AND CORONARY ANGIOGRAPHY;  Surgeon: Court Dorn PARAS, MD;  Location: [facility];  Service: Cardiovascular;  Laterality: N/A;   TEE WITHOUT CARDIOVERSION N/A 02/17/2020   Procedure: TRANSESOPHAGEAL ECHOCARDIOGRAM (TEE);  Surgeon: Fleeta Hanford, Coy, MD;  Location: [facility] OR;  Service: Open Heart Surgery;  Laterality: N/A;   TONSILECTOMY, ADENOIDECTOMY, BILATERAL MYRINGOTOMY AND TUBES     unsure of all of that but + tonsilectomy   Social History:   reports that he has been smoking cigarettes. He has a 25 pack-year smoking history. He has never used smokeless tobacco. He reports current alcohol use. He reports that he does not use drugs.  Family History  Problem Relation Age of Onset   Heart attack Mother    Heart disease Father    Heart attack Father    Heart failure Father    Aneurysm Father    Healthy Brother    Heart disease Paternal Uncle     Medications: Patient's Medications  New Prescriptions   No medications on file  Previous Medications   ASPIRIN  EC 81 MG TABLET    Take 1 tablet (81 mg total) by mouth daily. Swallow whole.   ATORVASTATIN  (LIPITOR ) 20 MG TABLET    TAKE 1 TABLET BY MOUTH DAILY   OMEPRAZOLE  (PRILOSEC) 40 MG CAPSULE    Take 1 capsule (40 mg total) by mouth daily before breakfast.   TAMSULOSIN  (FLOMAX ) 0.4 MG CAPS CAPSULE    Take 2 capsules (0.8 mg total) by mouth at bedtime.   TELMISARTAN  (MICARDIS ) 40 MG TABLET    Take 1 tablet (40 mg  total) by mouth daily.  Modified Medications   No medications on file  Discontinued Medications   PREDNISONE  (STERAPRED UNI-PAK 21 TAB) 10 MG (21) TBPK TABLET    Use as directed    Physical Exam:  Vitals:   03/07/24 1401  BP: 127/87  Pulse: 75  Resp: 18  Temp: 98.3 F (36.8 C)  SpO2: 95%  Weight: 197 lb (89.4 kg)  Height: 6' (1.829 m)   Body mass index is 26.72 kg/m. Wt Readings from Last 3 Encounters:  03/07/24 197 lb (89.4 kg)  02/27/24 191 lb 12.8 oz (87 kg)  02/26/24 194 lb (88 kg)    Physical Exam Constitutional:      General: He is not in acute distress.    Appearance: He is well-developed. He is not diaphoretic.  HENT:     Head: Normocephalic and atraumatic.     Right Ear: External ear normal.     Left Ear: External ear normal.     Mouth/Throat:     Pharynx: No oropharyngeal exudate.  Eyes:     Conjunctiva/sclera: Conjunctivae normal.     Pupils: Pupils are equal, round, and reactive to light.    Cardiovascular:     Rate and Rhythm: Normal rate and regular rhythm.     Heart sounds: Normal heart sounds.  Pulmonary:     Effort: Pulmonary effort is normal.     Breath sounds: Normal breath sounds.  Abdominal:     General: Bowel sounds are normal.     Palpations: Abdomen is soft.   Musculoskeletal:        General: No tenderness.     Cervical back: Normal range of motion and neck supple.     Right lower leg: No edema.     Left lower leg: No edema.   Skin:    General: Skin is warm and dry.   Neurological:     Mental Status: He is alert and oriented to person, place, and time.     Labs reviewed: Basic Metabolic Panel: Recent Labs    03/17/23 1300 03/18/23 0406 03/19/23 0405 12/04/23 1148 02/26/24 1200 02/27/24 2357  NA 138 137   < > 138 136 140  K 4.5 4.1   < > 3.8 3.8 3.4*  CL 103 105   < > 100 98 105  CO2 20* 23   < > 27 30 22   GLUCOSE 189* 113*   < > 105 149* 126*  BUN 9 15   < > 12 12 11   CREATININE 0.93 1.16   < > 1.00 0.85 0.91   CALCIUM  7.5* 7.3*   < > 8.7 9.2 7.8*  MG 1.3* 1.8  --   --   --   --    < > = values in this interval not displayed.   Liver Function Tests: Recent Labs    03/18/23 0406 04/06/23 0958 12/04/23 1148 12/18/23 0809 02/26/24 1200  AST 23   < > 55* 57* 31  ALT 13   < > 50* 52* 31  ALKPHOS 41  --   --   --  81  BILITOT 0.6   < > 0.5 0.8 0.9  PROT 5.2*   < > 6.4 6.7 7.1  ALBUMIN  3.1*  --   --   --  4.1   < > = values in this interval not displayed.   Recent Labs    03/18/23 0406  AMYLASE 21*   No results for input(s): AMMONIA in the last 8760 hours. CBC: Recent Labs    12/04/23 1148 02/26/24 1200 02/27/24 2357  WBC 4.8 4.8 5.0  NEUTROABS 2,146 3.3 2.4  HGB 14.3 15.0 13.0  HCT 41.5 43.9 37.8*  MCV 101.0* 102.7* 104.4*  PLT 177 164.0 160   Lipid Panel: Recent Labs    03/20/23 0354  CHOL 108  HDL 41  LDLCALC 50  TRIG 87  CHOLHDL 2.6   TSH: No results for input(s): TSH in the last 8760 hours. A1C: Lab Results  Component Value Date   HGBA1C 6.3 (H) 09/14/2023     Assessment/Plan Elevated PSA Assessment & Plan: Had follow up with urology in August 2024 but due to uncontrolled bp went to ED and was rescheduled however has not been back. Number provided to call and reschedule appt. Will follow up psa today.  Orders: -     PSA  Type 2 diabetes mellitus with other specified complication, without long-term current use of insulin  (HCC) Assessment & Plan: Encouraged dietary compliance, routine foot care/monitoring and to keep up  with diabetic eye exams through ophthalmology   Orders: -     Microalbumin / creatinine urine ratio -     Hemoglobin A1c  Current smoker Assessment & Plan: Encouraged cessation  Orders: -     Ambulatory Referral for Lung Cancer Scre  Coronary artery disease involving coronary bypass graft of native heart without angina pectoris Assessment & Plan: Stable without chest pains at this time. To restart ASA 81 mg daily.    Orders: -     Lipid panel  Primary hypertension Assessment & Plan: Blood pressure well controlled, goal bp <140/90 Continue current medications and dietary modifications follow metabolic panel   Benign prostatic hyperplasia with nocturia Assessment & Plan: Ongoing symptoms. To decrease flomax  to 0.8 mg at bedtime and make follow up with urologist    Peripheral arterial disease [facility]) Assessment & Plan: Due for follow up with vascular, to restart asa 81 mg daily       Return in about 4 months (around 07/07/2024) for routine follow up.  Verlean Allport K. Caro BODILY [facility] [phone]

## 2024-03-07 NOTE — Assessment & Plan Note (Signed)
 Encouraged dietary compliance, routine foot care/monitoring and to keep up with diabetic eye exams through ophthalmology

## 2024-03-08 ENCOUNTER — Ambulatory Visit: Payer: Self-pay | Admitting: Nurse Practitioner

## 2024-03-08 LAB — MICROALBUMIN / CREATININE URINE RATIO
Creatinine, Urine: 126 mg/dL (ref 20–320)
Microalb Creat Ratio: 990 mg/g{creat} — ABNORMAL HIGH (ref <30)
Microalb, Ur: 124.8 mg/dL

## 2024-03-08 LAB — LIPID PANEL
Cholesterol: 164 mg/dL (ref <200)
HDL: 60 mg/dL (ref >=40)
LDL Cholesterol (Calc): 88 mg/dL
Non-HDL Cholesterol (Calc): 104 mg/dL (ref <130)
Total CHOL/HDL Ratio: 2.7 (calc) (ref <5.0)
Triglycerides: 71 mg/dL (ref <150)

## 2024-03-08 LAB — HEMOGLOBIN A1C
Hgb A1c MFr Bld: 6.1 % — ABNORMAL HIGH (ref <5.7)
Mean Plasma Glucose: 128 mg/dL
eAG (mmol/L): 7.1 mmol/L

## 2024-03-08 LAB — PSA: PSA: 179.48 ng/mL — ABNORMAL HIGH (ref <=4.00)

## 2024-03-09 ENCOUNTER — Telehealth: Payer: Self-pay | Admitting: Nurse Practitioner

## 2024-03-09 DIAGNOSIS — R972 Elevated prostate specific antigen [PSA]: Secondary | ICD-10-CM

## 2024-03-09 NOTE — Telephone Encounter (Signed)
 Elevated PSA, also see labs

## 2024-03-09 NOTE — Telephone Encounter (Unsigned)
 Copied from CRM [id number]. Topic: General - Other >> Mar 09, 2024  9:50 AM Carmell SAUNDERS wrote: Reason for CRM: Pt says that he was advised by his pcp to call and schedule an appt with the urologist over at [facility], but he does not know what he is scheduling the appt for. Please follow up to advise.

## 2024-03-09 NOTE — Telephone Encounter (Signed)
 Tried calling patient back, LMOM to return call.

## 2024-03-10 ENCOUNTER — Other Ambulatory Visit: Payer: Self-pay

## 2024-03-10 ENCOUNTER — Ambulatory Visit

## 2024-03-10 DIAGNOSIS — R7989 Other specified abnormal findings of blood chemistry: Secondary | ICD-10-CM

## 2024-03-10 DIAGNOSIS — Z23 Encounter for immunization: Secondary | ICD-10-CM | POA: Diagnosis not present

## 2024-03-10 DIAGNOSIS — R809 Proteinuria, unspecified: Secondary | ICD-10-CM

## 2024-03-10 NOTE — Telephone Encounter (Signed)
 Dr Sheree is his vascular doctor.  He was seen by urology in the past but can place another referral

## 2024-03-10 NOTE — Telephone Encounter (Signed)
 I have called [facility] and they don't have this patient in their system at all. They don't have a provider named Dr.Cain either. I think patient needs a referral placed. I have spoken to patient and he states that he will call their office and schedule an appointment. Message routed back to PCP Caro, Harlene POUR, NP

## 2024-03-10 NOTE — Addendum Note (Signed)
 Addended by: Lizzy Hamre K on: 03/10/2024 09:32 AM   Modules accepted: Orders

## 2024-03-10 NOTE — Telephone Encounter (Signed)
 Ok, thank you for your help. I will let him know you have placed a referral.

## 2024-03-15 ENCOUNTER — Other Ambulatory Visit: Payer: Self-pay | Admitting: Nurse Practitioner

## 2024-03-15 DIAGNOSIS — R972 Elevated prostate specific antigen [PSA]: Secondary | ICD-10-CM

## 2024-03-15 DIAGNOSIS — I10 Essential (primary) hypertension: Secondary | ICD-10-CM

## 2024-03-15 NOTE — Progress Notes (Signed)
 Urologist has reviewed chart and request PET scan prior to appt for review due to elevated PSA.  Please let patient know he should be hearing about this to schedule- Dr Watt wants completed prior to his appt.  If he does not hear anything to contact our office back in 1 week.

## 2024-03-15 NOTE — Progress Notes (Addendum)
 Mychart message sent to patient and I called and discussed message with patient. Patient has already received a call to set up appointment

## 2024-03-24 ENCOUNTER — Telehealth: Payer: Self-pay

## 2024-03-24 DIAGNOSIS — Z122 Encounter for screening for malignant neoplasm of respiratory organs: Secondary | ICD-10-CM

## 2024-03-24 DIAGNOSIS — Z87891 Personal history of nicotine dependence: Secondary | ICD-10-CM

## 2024-03-24 DIAGNOSIS — F17210 Nicotine dependence, cigarettes, uncomplicated: Secondary | ICD-10-CM

## 2024-03-24 NOTE — Telephone Encounter (Signed)
 Lung Cancer Screening Narrative/Criteria Questionnaire (Cigarette Smokers Only- No Cigars/Pipes/vapes)   Karl Garcia   SDMV:03/31/2024 at 9:00 am with Karl Garcia  05-15-1961   LDCT: 04/08/2024 at 7:30 am    63 y.o.   Phone: [phone]  Lung Screening Narrative (confirm age 8-77 yrs Medicare / 50-80 yrs Private pay insurance)   Insurance information: Medicare   Referring Provider: Caro   This screening involves an initial phone call with a team member from our program. It is called a shared decision making visit. The initial meeting is required by  insurance and Medicare to make sure you understand the program. This appointment takes about 15-20 minutes to complete. You will complete the screening scan at your scheduled date/time.  This scan takes about 5-10 minutes to complete. You can eat and drink normally before and after the scan.  Criteria questions for Lung Cancer Screening:   Are you a current or former smoker? Current Age began smoking: 13   If you are a former smoker, what year did you quit smoking?  Never Quit (within 15 yrs)   To calculate your smoking history, I need an accurate estimate of how many packs of cigarettes you smoked per day and for how many years. (Not just the number of PPD you are now smoking)   Years smoking 50 x Packs per day 2 = Pack years 100   (at least 20 pack yrs)   (Make sure they understand that we need to know how much they have smoked in the past, not just the number of PPD they are smoking now)  Do you have a personal history of cancer?  No    Do you have a family history of cancer? No  Are you coughing up blood?  No  Have you had unexplained weight loss of 15 lbs or more in the last 6 months? No  It looks like you meet all criteria.  When would be a good time for us  to schedule you for this screening?   Additional information: N/A

## 2024-03-31 ENCOUNTER — Encounter (HOSPITAL_COMMUNITY)
Admission: RE | Admit: 2024-03-31 | Discharge: 2024-03-31 | Disposition: A | Discharge status: Home / Self Care | Source: Ambulatory Visit | Attending: Nurse Practitioner | Admitting: Nurse Practitioner

## 2024-03-31 ENCOUNTER — Encounter: Payer: Self-pay | Admitting: Physician Assistant

## 2024-03-31 ENCOUNTER — Ambulatory Visit: Admitting: Physician Assistant

## 2024-03-31 DIAGNOSIS — R972 Elevated prostate specific antigen [PSA]: Secondary | ICD-10-CM | POA: Insufficient documentation

## 2024-03-31 DIAGNOSIS — F17210 Nicotine dependence, cigarettes, uncomplicated: Secondary | ICD-10-CM | POA: Diagnosis not present

## 2024-03-31 DIAGNOSIS — C61 Malignant neoplasm of prostate: Secondary | ICD-10-CM | POA: Insufficient documentation

## 2024-03-31 DIAGNOSIS — C778 Secondary and unspecified malignant neoplasm of lymph nodes of multiple regions: Secondary | ICD-10-CM | POA: Insufficient documentation

## 2024-03-31 MED ORDER — FLOTUFOLASTAT F 18 GALLIUM 296-5846 MBQ/ML IV SOLN
8.1600 | Freq: Once | INTRAVENOUS | Status: AC
  Administered 2024-03-31: 8.16 via INTRAVENOUS

## 2024-03-31 NOTE — Patient Instructions (Signed)

## 2024-03-31 NOTE — Progress Notes (Signed)
 Virtual Visit via Telephone Note  I connected with Karl Garcia on 03/31/24 at  9:26 AM by telephone and verified that I am speaking with the correct person using two identifiers.  Location: Patient: home Provider: working virtually from home   I discussed the limitations, risks, security and privacy concerns of performing an evaluation and management service by telephone and the availability of in person appointments. I also discussed with the patient that there may be a patient responsible charge related to this service. The patient expressed understanding and agreed to proceed.     Shared Decision Making Visit Lung Cancer Screening Program [id number])   Eligibility: Age 63 Pack Years Smoking History Calculation 100 (# packs/per year x # years smoked) Recent History of coughing up blood  No Unexplained weight loss? No ( >Than 15 pounds within the last 6 months ) Prior History Lung / other cancer No (Diagnosis within the last 5 years already requiring surveillance chest CT Scans). Smoking Status Current Smoker  Visit Components: Discussion included one or more decision making aids. Yes Discussion included risk/benefits of screening. Yes Discussion included potential follow up diagnostic testing for abnormal scans. Yes Discussion included meaning and risk of over diagnosis. Yes Discussion included meaning and risk of False Positives. Yes Discussion included meaning of total radiation exposure. Yes  Counseling Included: Importance of adherence to annual lung cancer LDCT screening. Yes Impact of comorbidities on ability to participate in the program. Yes Ability and willingness to under diagnostic treatment: Yes  Smoking Cessation Counseling: Current Smokers:  Discussed importance of smoking cessation. Yes Information about tobacco cessation classes and interventions provided to patient. Yes Symptomatic Patient. No Diagnosis Code: Tobacco Use Z72.0 Asymptomatic Patient  Yes  Counseling (Intermediate counseling: > three minutes counseling) H9563 Information about tobacco cessation classes and interventions provided to patient. Yes Written Order for Lung Cancer Screening with LDCT placed in Epic. Yes (CT Chest Lung Cancer Screening Low Dose W/O CM) [id number] Z12.2-Screening of respiratory organs Z87.891-Personal history of nicotine  dependence   I have spent 25 minutes of face to face/ virtual visit  time with the patient discussing the risks and benefits of lung cancer screening. We discussed the above noted topics. We paused at intervals to allow for questions to be asked and answered to ensure understanding.We discussed that the single most powerful action that anyone can take to decrease their risk of developing lung cancer is to quit smoking.  We discussed options for tools to aid in quitting smoking including nicotine  replacement therapy, non-nicotine  medications, support groups, Quit Smart classes, and behavior modification. We discussed that often times setting smaller, more achievable goals, such as eliminating 1 cigarette a day for a week and then 2 cigarettes a day for a week can be helpful in slowly decreasing the number of cigarettes smoked. I provided  them  with smoking cessation  information  with contact information for community resources, classes, free nicotine  replacement therapy, and access to mobile apps, text messaging, and on-line smoking cessation help. I have also provided  them  the office contact information in the event they have any questions. We discussed the time and location of the scan, and that either Karna Curly RN, Karna Doom, RN  or I will call / send a letter with the results within 24-72 hours of receiving them. The patient verbalized understanding of all of  the above and had no further questions upon leaving the office. They have my contact information in the event they have any further  questions.  I spent 1 minutes counseling  on smoking cessation and the health risks of continued tobacco abuse.  I explained to the patient that there has been a high incidence of coronary artery disease noted on these exams. I explained that this is a non-gated exam therefore degree or severity cannot be determined. This patient is on statin therapy. I have asked the patient to follow-up with their PCP regarding any incidental finding of coronary artery disease and management with diet or medication as their PCP  feels is clinically indicated. The patient verbalized understanding of the above and had no further questions upon completion of the visit.    Leita SAUNDERS Woodard Perrell, PA-C

## 2024-04-07 ENCOUNTER — Telehealth: Payer: Self-pay

## 2024-04-07 NOTE — Telephone Encounter (Signed)
 Pt completed a PET scan for PCP 7/24/205, results pending. He is currently scheduled for a LDCT to screen for Lung Cancer. Appt has been cancelled and we will f/u on PET results and advised patient on when LDCT will be needed.

## 2024-04-08 ENCOUNTER — Inpatient Hospital Stay: Admission: RE | Admit: 2024-04-08 | Source: Ambulatory Visit

## 2024-04-08 LAB — BASIC METABOLIC PANEL WITH GFR
BUN: 17 (ref 4–21)
CO2: 25 — AB (ref 13–22)
Chloride: 101 (ref 99–108)
Creatinine: 0.9 (ref 0.6–1.3)
Glucose: 104
Potassium: 4.3 meq/L (ref 3.5–5.1)
Sodium: 139 (ref 137–147)

## 2024-04-08 LAB — COMPREHENSIVE METABOLIC PANEL WITH GFR
Albumin: 4.2 (ref 3.5–5.0)
Calcium: 9.3 (ref 8.7–10.7)
eGFR: 95

## 2024-04-11 ENCOUNTER — Ambulatory Visit (INDEPENDENT_AMBULATORY_CARE_PROVIDER_SITE_OTHER)

## 2024-04-11 ENCOUNTER — Other Ambulatory Visit: Payer: Self-pay

## 2024-04-11 DIAGNOSIS — Z23 Encounter for immunization: Secondary | ICD-10-CM | POA: Diagnosis not present

## 2024-04-11 DIAGNOSIS — I739 Peripheral vascular disease, unspecified: Secondary | ICD-10-CM

## 2024-04-14 ENCOUNTER — Encounter: Payer: Self-pay | Admitting: Nurse Practitioner

## 2024-04-15 ENCOUNTER — Encounter: Payer: Self-pay | Admitting: Nurse Practitioner

## 2024-04-15 ENCOUNTER — Ambulatory Visit: Admitting: Nurse Practitioner

## 2024-04-15 DIAGNOSIS — Z0000 Encounter for general adult medical examination without abnormal findings: Secondary | ICD-10-CM

## 2024-04-15 DIAGNOSIS — I10 Essential (primary) hypertension: Secondary | ICD-10-CM

## 2024-04-15 MED ORDER — TELMISARTAN 40 MG PO TABS: 40.0000 mg | ORAL_TABLET | Freq: Every day | ORAL | 1 refills | Status: AC

## 2024-04-15 NOTE — Progress Notes (Signed)
 This service is provided via telemedicine  No vital signs collected/recorded due to the encounter was a telemedicine visit.   Location of patient (ex: home, work):  home  Patient consents to a telephone visit: yes  Location of the provider (ex: office, home):  [facility]   Name of any referring provider:  N/A  Names of all persons participating in the telemedicine service and their role in the encounter:  Evertt Pereyra CMA, Caro Raisin NP and Patient. Time spent on call: 12

## 2024-04-15 NOTE — Patient Instructions (Addendum)
  Karl Garcia , Thank you for taking time to come for your Medicare Wellness Visit. I appreciate your ongoing commitment to your health goals. Please review the following plan we discussed and let me know if I can assist you in the future.      CALL urology and confirm appt date and time  COMPLETE Cologuard  SHINGLES, TDAP and COVID- to get at local pharmacy We can give you flu shot in office or can get at your local pharmacy   This is a list of the screening recommended for you and due dates:  Health Maintenance  Topic Date Due   Complete foot exam   Never done   Cologuard (Stool DNA test)  Never done   Zoster (Shingles) Vaccine (1 of 2) Never done   COVID-19 Vaccine (2 - 2024-25 season) 05/10/2023   DTaP/Tdap/Td vaccine (2 - Td or Tdap) 09/25/2023   Screening for Lung Cancer  01/06/2024   Flu Shot  04/08/2024   Pneumococcal Vaccine for age over 58 (1 of 2 - PCV) 03/07/2025*   Hemoglobin A1C  09/06/2024   Eye exam for diabetics  09/14/2024   Yearly kidney health urinalysis for diabetes  03/07/2025   Yearly kidney function blood test for diabetes  04/08/2025   Medicare Annual Wellness Visit  04/15/2025   Hepatitis B Vaccine  Completed   Hepatitis C Screening  Completed   HIV Screening  Completed   HPV Vaccine  Aged Out   Meningitis B Vaccine  Aged Out  *Topic was postponed. The date shown is not the original due date.

## 2024-04-15 NOTE — Progress Notes (Signed)
 Subjective:   Karl Garcia is a 63 y.o. male who presents for Medicare Annual/Subsequent preventive examination.  Visit Complete: Virtual I connected with  Karl Garcia on 04/15/24 by a video and audio enabled telemedicine application and verified that I am speaking with the correct person using two identifiers.  Patient Location: Home  Provider Location: Office/Clinic  I discussed the limitations of evaluation and management by telemedicine. The patient expressed understanding and agreed to proceed.  Vital Signs: Because this visit was a virtual/telehealth visit, some criteria may be missing or patient reported. Any vitals not documented were not able to be obtained and vitals that have been documented are patient reported.   Cardiac Risk Factors include: male gender;advanced age (>39men, >52 women);diabetes mellitus;dyslipidemia;hypertension;smoking/ tobacco exposure     Objective:    There were no vitals filed for this visit. There is no height or weight on file to calculate BMI.     02/27/2024   11:50 PM 12/04/2023   11:20 AM 10/23/2023   12:43 PM 09/14/2023    1:44 PM 05/14/2023   11:28 AM 04/23/2023    2:38 PM 04/21/2023    8:33 AM  Advanced Directives  Does Patient Have a Medical Advance Directive? Unable to assess, patient is non-responsive or altered mental status No No No No No No  Would patient like information on creating a medical advance directive?  No - Patient declined No - Patient declined No - Patient declined No - Patient declined No - Patient declined     Current Medications (verified) Outpatient Encounter Medications as of 04/15/2024  Medication Sig   atorvastatin  (LIPITOR ) 20 MG tablet TAKE 1 TABLET BY MOUTH DAILY   omeprazole  (PRILOSEC) 40 MG capsule Take 1 capsule (40 mg total) by mouth daily before breakfast.   tamsulosin  (FLOMAX ) 0.4 MG CAPS capsule Take 2 capsules (0.8 mg total) by mouth at bedtime.   [DISCONTINUED] telmisartan  (MICARDIS ) 40 MG  tablet TAKE 1 TABLET BY MOUTH DAILY   aspirin  EC 81 MG tablet Take 1 tablet (81 mg total) by mouth daily. Swallow whole. (Patient not taking: Reported on 03/07/2024)   telmisartan  (MICARDIS ) 40 MG tablet Take 1 tablet (40 mg total) by mouth daily.   No facility-administered encounter medications on file as of 04/15/2024.    Allergies (verified) Morphine  and codeine   History: Past Medical History:  Diagnosis Date   Coronary artery disease    S/p NSTEMI 02/2020 >> s/p CABG // Echocardiogram 6/21: EF 55-60, Gr 2 DD, LAE   Diabetes mellitus Type 2    Essential hypertension    ETOH abuse    Hx of NSTEMI in 02/2020    Hyperlipidemia    L subclavian stenosis    NSTEMI (non-ST elevated myocardial infarction) (HCC) 02/16/2020   Peripheral Arterial Disease    Tobacco abuse    Past Surgical History:  Procedure Laterality Date   ABDOMINAL AORTOGRAM W/LOWER EXTREMITY Bilateral 07/16/2020   Procedure: ABDOMINAL AORTOGRAM W/LOWER EXTREMITY;  Surgeon: Court Dorn PARAS, MD;  Location: [facility];  Service: Cardiovascular;  Laterality: Bilateral;   AORTA - BILATERAL FEMORAL ARTERY BYPASS GRAFT N/A 03/17/2023   Procedure: AORTOBIFEMORAL BYPASS GRAFT USING X HEMASHIELD GOLD VASCULAR GRAFT, LIGATION OF INTERNAL MESENTERIC ARTERY;  Surgeon: Sheree Penne Bruckner, MD;  Location: [facility] OR;  Service: Vascular;  Laterality: N/A;   CORONARY ARTERY BYPASS GRAFT N/A 02/17/2020   Procedure: CORONARY ARTERY BYPASS GRAFTING (CABG) x Three, using left internal mammry artery and right leg  greater saphenous vein harvested endoscopically;  Surgeon: Fleeta Hanford Coy, MD;  Location: [facility] OR;  Service: Open Heart Surgery;  Laterality: N/A;   LEFT HEART CATH AND CORONARY ANGIOGRAPHY N/A 02/16/2020   Procedure: LEFT HEART CATH AND CORONARY ANGIOGRAPHY;  Surgeon: Court Dorn PARAS, MD;  Location: [facility];  Service: Cardiovascular;  Laterality: N/A;   TEE WITHOUT CARDIOVERSION N/A 02/17/2020   Procedure:  TRANSESOPHAGEAL ECHOCARDIOGRAM (TEE);  Surgeon: Fleeta Hanford, Coy, MD;  Location: [facility] OR;  Service: Open Heart Surgery;  Laterality: N/A;   TONSILECTOMY, ADENOIDECTOMY, BILATERAL MYRINGOTOMY AND TUBES     unsure of all of that but + tonsilectomy   Family History  Problem Relation Age of Onset   Heart attack Mother    Heart disease Father    Heart attack Father    Heart failure Father    Aneurysm Father    Healthy Brother    Heart disease Paternal Uncle    Social History   Socioeconomic History   Marital status: Single    Spouse name: Not on file   Number of children: Not on file   Years of education: Not on file   Highest education level: Not on file  Occupational History   Occupation: Unemployed for about 6 months now    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   Occupation: retired  Tobacco Use   Smoking status: Every Day    Current packs/day: 0.50    Average packs/day: 0.5 packs/day for 50.0 years (25.0 ttl pk-yrs)    Types: Cigarettes   Smokeless tobacco: Never   Tobacco comments:    Discussed with patient he is interested but not committed at this time  Vaping Use   Vaping status: Never Used  Substance and Sexual Activity   Alcohol use: Yes    Comment: a couple of drinks a day   Drug use: Never   Sexual activity: Yes  Other Topics Concern   Not on file  Social History Narrative   Tobacco use, amount per day now: 1/2 pack    Past tobacco use, amount per day: 1 pack    How many years did you use tobacco: 50 years    Alcohol use (drinks per week): 3-4 drinks    Diet: healthy    Do you drink/eat things with caffeine: yes, coffee   Marital status:  single                                What year were you married? N/A   Do you live in a house, apartment, assisted living, condo, trailer, etc.? Currently homeless   Is it one or more stories? N/A   How many persons live in your home? N/A   Do you have pets in your home?( please list) N/A   Highest Level of education  completed? Some college, Public relations account executive.    Current or past profession: Land   Do you exercise?   Yes, No                               Type and how often? Walking everyday   Do you have a living will? No   Do you have a DNR form?   Yes  If not, do you want to discuss one?   Do you have signed POA/HPOA forms? No                        If so, please bring to you appointment      Do you have any difficulty bathing or dressing yourself? No   Do you have any difficulty preparing food or eating? No   Do you have any difficulty managing your medications? No   Do you have any difficulty managing your finances? Yes   Do you have any difficulty affording your medications?  No   Social Drivers of Corporate investment banker Strain: Low Risk  (03/19/2023)   Overall Financial Resource Strain (CARDIA)    Difficulty of Paying Living Expenses: Not hard at all  Food Insecurity: Patient Declined (03/24/2023)   Hunger Vital Sign    Worried About Running Out of Food in the Last Year: Patient declined    Ran Out of Food in the Last Year: Patient declined  Transportation Needs: Patient Declined (03/24/2023)   PRAPARE - Administrator, Civil Service (Medical): Patient declined    Lack of Transportation (Non-Medical): Patient declined  Physical Activity: Sufficiently Active (03/19/2023)   Exercise Vital Sign    Days of Exercise per Week: 7 days    Minutes of Exercise per Session: 60 min  Stress: No Stress Concern Present (03/19/2023)   [employer] of Occupational Health - Occupational Stress Questionnaire    Feeling of Stress : Not at all  Social Connections: Moderately Isolated (03/19/2023)   Social Connection and Isolation Panel    Frequency of Communication with Friends and Family: More than three times a week    Frequency of Social Gatherings with Friends and Family: More than three times a week    Attends Religious Services: Never    Database administrator or  Organizations: No    Attends Engineer, structural: Never    Marital Status: Living with partner    Tobacco Counseling Ready to quit: Not Answered Counseling given: Not Answered Tobacco comments: Discussed with patient he is interested but not committed at this time   Clinical Intake:  Pre-visit preparation completed: Yes  Pain : No/denies pain     BMI - recorded: 26 Nutritional Risks: None  How often do you need to have someone help you when you read instructions, pamphlets, or other written materials from your doctor or pharmacy?: 1 - Never         Activities of Daily Living    04/15/2024    8:54 AM  In your present state of health, do you have any difficulty performing the following activities:  Hearing? 0  Vision? 0  Difficulty concentrating or making decisions? 0  Walking or climbing stairs? 0  Dressing or bathing? 0  Doing errands, shopping? 0  Preparing Food and eating ? N  Using the Toilet? N  In the past six months, have you accidently leaked urine? N  Do you have problems with loss of bowel control? N  Managing your Medications? N  Managing your Finances? N  Housekeeping or managing your Housekeeping? N    Patient Care Team: Caro Harlene POUR, NP as PCP - General (Geriatric Medicine) Okey Vina GAILS, MD as PCP - Cardiology (Cardiology) Ezzard Rolin BIRCH, LCSW as Social Worker (Licensed Clinical Social Worker) [facility] Watt Rush, MD as Attending Physician (Urology)  Indicate any recent  Medical Services you may have received from other than [facility] providers in the past year (date may be approximate).     Assessment:   This is a routine wellness examination for Karl Garcia.  Hearing/Vision screen No results found.   Goals Addressed   None    Depression Screen    10/23/2023    1:15 PM 09/14/2023    1:43 PM 04/23/2023    2:38 PM 03/19/2023   10:21 AM 03/06/2023    9:39 AM 12/29/2022    1:26 PM  PHQ 2/9 Scores  PHQ - 2  Score 0 0 0 0 0 0  Exception Documentation    Patient refusal      Fall Risk    10/23/2023    1:15 PM 09/14/2023    1:43 PM 05/15/2023    9:02 AM 04/23/2023    2:38 PM 03/06/2023    9:39 AM  Fall Risk   Falls in the past year? 0 0 0 0 0  Number falls in past yr: 0 0 0 0 0  Injury with Fall? 0 0 0 0 0  Risk for fall due to : No Fall Risks  No Fall Risks No Fall Risks No Fall Risks  Follow up Falls evaluation completed   Falls evaluation completed Falls evaluation completed    MEDICARE RISK AT HOME: Medicare Risk at Home Any stairs in or around the home?: Yes If so, are there any without handrails?: No Home free of loose throw rugs in walkways, pet beds, electrical cords, etc?: Yes Adequate lighting in your home to reduce risk of falls?: Yes Life alert?: No Use of a cane, walker or w/c?: No Grab bars in the bathroom?: No Shower chair or bench in shower?: No Elevated toilet seat or a handicapped toilet?: No  TIMED UP AND GO:  Was the test performed?  No    Cognitive Function:        04/15/2024    8:19 AM  6CIT Screen  What Year? 0 points  What month? 0 points  What time? 0 points  Count back from 20 0 points  Months in reverse 0 points  Repeat phrase 0 points  Total Score 0 points    Immunizations Immunization History  Administered Date(s) Administered   Hepatitis A, Adult 03/10/2024   Hepb-cpg 03/10/2024, 04/11/2024   Janssen (J&J) SARS-COV-2 Vaccination 02/25/2020   Tdap 09/24/2013    TDAP status: Due, Education has been provided regarding the importance of this vaccine. Advised may receive this vaccine at local pharmacy or Health Dept. Aware to provide a copy of the vaccination record if obtained from local pharmacy or Health Dept. Verbalized acceptance and understanding.  Flu Vaccine status: Due, Education has been provided regarding the importance of this vaccine. Advised may receive this vaccine at local pharmacy or Health Dept. Aware to provide a copy of the  vaccination record if obtained from local pharmacy or Health Dept. Verbalized acceptance and understanding.  Pneumococcal vaccine status: Due, Education has been provided regarding the importance of this vaccine. Advised may receive this vaccine at local pharmacy or Health Dept. Aware to provide a copy of the vaccination record if obtained from local pharmacy or Health Dept. Verbalized acceptance and understanding.  Covid-19 vaccine status: Information provided on how to obtain vaccines.   Qualifies for Shingles Vaccine? Yes   Zostavax completed No   Shingrix Completed?: No.    Education has been provided regarding the importance of this vaccine. Patient has been advised to call  insurance company to determine out of pocket expense if they have not yet received this vaccine. Advised may also receive vaccine at local pharmacy or Health Dept. Verbalized acceptance and understanding.  Screening Tests Health Maintenance  Topic Date Due   FOOT EXAM  Never done   Fecal DNA (Cologuard)  Never done   Zoster Vaccines- Shingrix (1 of 2) Never done   COVID-19 Vaccine (2 - 2024-25 season) 05/10/2023   DTaP/Tdap/Td (2 - Td or Tdap) 09/25/2023   Lung Cancer Screening  01/06/2024   INFLUENZA VACCINE  04/08/2024   Pneumococcal Vaccine: 50+ Years (1 of 2 - PCV) 03/07/2025 (Originally 01/03/1980)   HEMOGLOBIN A1C  09/06/2024   OPHTHALMOLOGY EXAM  09/14/2024   Diabetic kidney evaluation - Urine ACR  03/07/2025   Diabetic kidney evaluation - eGFR measurement  04/08/2025   Medicare Annual Wellness (AWV)  04/15/2025   Hepatitis B Vaccines  Completed   Hepatitis C Screening  Completed   HIV Screening  Completed   HPV VACCINES  Aged Out   Meningococcal B Vaccine  Aged Out    Health Maintenance  Health Maintenance Due  Topic Date Due   FOOT EXAM  Never done   Fecal DNA (Cologuard)  Never done   Zoster Vaccines- Shingrix (1 of 2) Never done   COVID-19 Vaccine (2 - 2024-25 season) 05/10/2023    DTaP/Tdap/Td (2 - Td or Tdap) 09/25/2023   Lung Cancer Screening  01/06/2024   INFLUENZA VACCINE  04/08/2024    He has NEVER done colorectal screening- cologuard has been ordered but he has not completed- reports he will do it today.   Lung Cancer Screening: (Low Dose CT Chest recommended if Age 15-80 years, 20 pack-year currently smoking OR have quit w/in 15years.) does qualify.   Lung Cancer Screening Referral: done  Additional Screening:  Hepatitis C Screening: does qualify; Completed   Vision Screening: Recommended annual ophthalmology exams for early detection of glaucoma and other disorders of the eye. Is the patient up to date with their annual eye exam?  No  Who is the provider or what is the name of the office in which the patient attends annual eye exams? Does not have eye doctor If pt is not established with a provider, would they like to be referred to a provider to establish care? Yes .   Dental Screening: Recommended annual dental exams for proper oral hygiene   Community Resource Referral / Chronic Care Management: CRR required this visit?  No   CCM required this visit?  No     Plan:     I have personally reviewed and noted the following in the patient's chart:   Medical and social history Use of alcohol, tobacco or illicit drugs  Current medications and supplements including opioid prescriptions. Patient is not currently taking opioid prescriptions. Functional ability and status Nutritional status Physical activity Advanced directives List of other physicians Hospitalizations, surgeries, and ER visits in previous 12 months Vitals Screenings to include cognitive, depression, and falls Referrals and appointments  In addition, I have reviewed and discussed with patient certain preventive protocols, quality metrics, and best practice recommendations. A written personalized care plan for preventive services as well as general preventive health  recommendations were provided to patient.     Harlene MARLA An, NP   04/15/2024   After Visit Summary: (MyChart) Due to this being a telephonic visit, the after visit summary with patients personalized plan was offered to patient via MyChart

## 2024-04-28 ENCOUNTER — Ambulatory Visit: Admitting: Physician Assistant

## 2024-05-03 NOTE — Progress Notes (Unsigned)
 05/04/2024 Karl Garcia [id number] 1961/02/11  Referring provider: Caro Harlene POUR, NP Primary GI doctor: Dr. 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 Has follow with vascular but symptoms greatly improved on the omeprazole  40 mg once daily - Provide information on gastroparesis and dietary modifications. - continue on omeprazole  40 mg daily - counseled on ETOH cessation - Consider endoscopy if no improvement, declines at this time, would need to get clearance  Screening colon Never had colonoscopy, no family history No changes in bowel habits Has cologuard at home, declines colonoscopy, will get if positive  Elevated LFTs in setting of alcohol abuse Isolated AST/ALT elevation since 12/04/2023 RUQ US  03/07/2024 fatty liver CT abdomen pelvis 12/24/2023 unremarkable liver, no bile duct changes Hepatocellular workup 02/26/2024 showed negative hepatitis needs hepatitis A and B vaccinations  Negative celiac, IgG, AMA, ASMA, ferritin elevated but normal iron and saturation ratios, ANA positive but very low titer 1: 40    Latest Ref Rng & Units 04/08/2024   12:00 AM 02/26/2024   12:00 PM 12/18/2023    8:09 AM  Hepatic Function  Total Protein 6.0 - 8.3 g/dL  7.1  6.7   Albumin  3.5 - 5.0 4.2     4.1    AST 0 - 37 U/L  31  57   ALT 0 - 53 U/L  31  52   Alk Phosphatase 39 - 117 U/L  81    Total Bilirubin 0.2 - 1.2 mg/dL  0.9  0.8   Bilirubin, Direct 0.0 - 0.3 mg/dL   0.2  0.2      This result is from an external source.   Platelets 177  INR 03/17/2023 1.2  Started on Lipitor  12/09/2022 No NSAIDS Drink liqueur 2-3 drinks a day for years, 2-3 shots per drink, can be more Tobacco use Symptoms improved, likely from statin, continue to monitor - advised to stop drinking  CAD EF 55-60, normal wall motion, mild LVH, GRII DD, normal RV SF, moderate LAE, trivial MR  Status post bypass June 2021 No chest pain  Peripheral arterial disease history of tobacco use  Status post left subclavian stenosis aortofemoral bypass with Dr. Sheree 03/2023   Patient Care Team: Caro Harlene POUR, NP as PCP - General (Geriatric Medicine) Okey Vina GAILS, MD as PCP - Cardiology (Cardiology) Ezzard Rolin BIRCH, LCSW as Social Worker (Licensed Clinical Social Worker) [facility] Watt Rush, MD as Attending Physician (Urology)  HISTORY OF PRESENT ILLNESS: 63 y.o. male with a past medical history listed below presents as a new patient for evaluation of flank pain and elevated LFTs.   Patient last seen in the office 02/26/2024 for elevated LFTs in setting of alcohol use, bloating abdominal discomfort.  Discussed the use of AI scribe software for clinical note transcription with the patient, who gave verbal consent to proceed.  History of Present Illness   Karl Garcia is a 63 year old male who presents with bloating and abdominal discomfort.  He experiences bloating and abdominal discomfort, which have greatly improved  with the use of omeprazole  40 mg daily. Despite the improvement, he still experiences early satiety but has not lost weight and has actually gained some weight. No swelling in his legs or abdomen, significant shortness of breath, nausea, vomiting, or dark black stools. He has a supply of approximately 100 capsules remaining.  His liver function tests, which were previously abnormal, have improved significantly since April and were better as of  June 20th. The workup for hepatitis and autoimmune issues was negative. His liver function abnormalities occurred after he was started on atorvastatin , but his liver function has since returned to normal.  He has not reduced his alcohol intake despite previous discussions. He has not yet completed the Cologuard test, citing a recent prostate biopsy as a reason for delay. No blood in his urine or stool following the biopsy.      He  reports that he has been smoking cigarettes. He has a 25 pack-year smoking history. He has never used smokeless tobacco. He reports current alcohol use. He reports that he does not use drugs.  Wt Readings from Last 3 Encounters:  05/04/24 200 lb (90.7 kg)  03/07/24 197 lb (89.4 kg)  02/27/24 191 lb 12.8 oz (87 kg)    RELEVANT GI HISTORY, IMAGING AND LABS: Results   LABS Liver function tests: Improved compared to April (02/26/2024) Hepatitis panel: Negative Autoimmune panel: Negative  PATHOLOGY Prostate biopsy: (05/03/2024)      CBC    Component Value Date/Time   WBC 5.0 02/27/2024 2357   RBC 3.62 (L) 02/27/2024 2357   HGB 13.0 02/27/2024 2357   HGB 12.4 (L) 06/21/2020 1102   HCT 37.8 (L) 02/27/2024 2357   HCT 35.7 (L) 06/21/2020 1102   PLT 160 02/27/2024 2357   PLT 199 06/21/2020 1102   MCV 104.4 (H) 02/27/2024 2357   MCV 93 06/21/2020 1102   MCH 35.9 (H) 02/27/2024 2357   MCHC 34.4 02/27/2024 2357   RDW 13.4 02/27/2024 2357   RDW 13.7 06/21/2020 1102   LYMPHSABS 1.7 02/27/2024 2357   LYMPHSABS 1.4 06/21/2020 1102   MONOABS 0.6 02/27/2024 2357   EOSABS 0.2 02/27/2024 2357   EOSABS 0.2 06/21/2020 1102   BASOSABS 0.1 02/27/2024 2357   BASOSABS 0.1 06/21/2020 1102   Recent Labs    05/15/23 0923 09/14/23 1415 12/04/23 1148 02/26/24 1200 02/27/24 2357  HGB 14.1 13.9 14.3 15.0 13.0    CMP     Component Value Date/Time   NA 139 04/08/2024 0000   K 4.3 04/08/2024 0000   CL 101 04/08/2024 0000   CO2 25 (A) 04/08/2024 0000   GLUCOSE  126 (H) 02/27/2024 2357   BUN 17 04/08/2024 0000   CREATININE 0.9 04/08/2024 0000   CREATININE 0.91 02/27/2024 2357   CREATININE 1.00 12/04/2023 1148   CALCIUM  9.3 04/08/2024 0000   PROT 7.1 02/26/2024 1200   PROT 6.6 04/17/2020 1341   ALBUMIN  4.2 04/08/2024 0000   ALBUMIN  4.1 04/17/2020 1341   AST 31 02/26/2024 1200   ALT 31 02/26/2024 1200   ALKPHOS 81 02/26/2024 1200   BILITOT 0.9 02/26/2024 1200   BILITOT 0.5 04/17/2020 1341   GFRNONAA >60 02/27/2024 2357   GFRAA 120 06/27/2020 1649      Latest Ref Rng & Units 04/08/2024   12:00 AM 02/26/2024   12:00 PM 12/18/2023    8:09 AM  Hepatic Function  Total Protein 6.0 - 8.3 g/dL  7.1  6.7   Albumin  3.5 - 5.0 4.2  4.1    AST 0 - 37 U/L  31  57   ALT 0 - 53 U/L  31  52   Alk Phosphatase 39 - 117 U/L  81    Total Bilirubin 0.2 - 1.2 mg/dL  0.9  0.8   Bilirubin, Direct 0.0 - 0.3 mg/dL  0.2  0.2      This result is from an external source.      Current Medications:    Current Outpatient Medications (Cardiovascular):    atorvastatin  (LIPITOR ) 20 MG tablet, TAKE 1 TABLET BY MOUTH DAILY   telmisartan  (MICARDIS ) 40 MG tablet, Take 1 tablet (40 mg total) by mouth daily.   Current Outpatient Medications (Analgesics):    aspirin  EC 81 MG tablet, Take 1 tablet (81 mg total) by mouth daily. Swallow whole. (Patient not taking: Reported on 05/04/2024)   Current Outpatient Medications (Other):    omeprazole  (PRILOSEC) 40 MG capsule, Take 1 capsule (40 mg total) by mouth daily before breakfast.   tamsulosin  (FLOMAX ) 0.4 MG CAPS capsule, Take 2 capsules (0.8 mg total) by mouth at bedtime.  Medical History:  Past Medical History:  Diagnosis Date   Coronary artery disease    S/p NSTEMI 02/2020 >> s/p CABG // Echocardiogram 6/21: EF 55-60, Gr 2 DD, LAE   Diabetes mellitus Type 2    Essential hypertension    ETOH abuse    Hx of NSTEMI in 02/2020    Hyperlipidemia    L subclavian stenosis    NSTEMI (non-ST elevated myocardial  infarction) (HCC) 02/16/2020   Peripheral Arterial Disease    Tobacco abuse    Allergies:  Allergies  Allergen Reactions   Morphine  And Codeine Rash     Surgical History:  He  has a past surgical history that includes Tonsilectomy, adenoidectomy, bilateral myringotomy and tubes; LEFT HEART CATH AND CORONARY ANGIOGRAPHY (N/A, 02/16/2020); Coronary artery bypass graft (N/A, 02/17/2020); TEE without cardioversion (N/A, 02/17/2020); ABDOMINAL AORTOGRAM W/LOWER EXTREMITY (Bilateral, 07/16/2020); and Aorta - bilateral femoral artery bypass graft (N/A, 03/17/2023). Family History:  His family history includes Aneurysm in his father; Healthy in his brother; Heart attack in his father and mother; Heart disease in his father and paternal uncle; Heart failure in his father.  REVIEW OF SYSTEMS  : All other systems reviewed and negative except where noted in the History of Present Illness.  PHYSICAL EXAM: BP 112/72 (BP Location: Left Arm, Patient Position: Sitting, Cuff Size: Normal)   Pulse 86   Ht 5' 10 (1.778 m)   Wt 200 lb (90.7 kg)   BMI 28.70 kg/m  Physical Exam   GENERAL APPEARANCE: Well nourished, in no apparent distress. HEENT: No cervical lymphadenopathy, unremarkable thyroid, sclerae anicteric, conjunctiva pink. RESPIRATORY: Respiratory effort normal, breath sounds equal bilaterally without rales, rhonchi, or wheezing. CARDIO: Regular rate and rhythm with no murmurs, rubs, or gallops, peripheral pulses intact. ABDOMEN: Soft, non-distended, active bowel sounds in all four quadrants, no tenderness to palpation, no rebound, no mass appreciated. RECTAL: Declines. MUSCULOSKELETAL: Full range of motion, normal gait, without edema. SKIN: Dry, intact without rashes or lesions. No jaundice. NEURO: Alert, oriented, no focal deficits. PSYCH: Cooperative, normal mood and affect.      Alan JONELLE Coombs, PA-C 9:39 AM

## 2024-05-04 ENCOUNTER — Encounter: Payer: Self-pay | Admitting: Physician Assistant

## 2024-05-04 ENCOUNTER — Ambulatory Visit (INDEPENDENT_AMBULATORY_CARE_PROVIDER_SITE_OTHER): Admitting: Physician Assistant

## 2024-05-04 VITALS — BP 112/72 | HR 86 | Ht 70.0 in | Wt 200.0 lb

## 2024-05-04 DIAGNOSIS — F1010 Alcohol abuse, uncomplicated: Secondary | ICD-10-CM

## 2024-05-04 DIAGNOSIS — Z951 Presence of aortocoronary bypass graft: Secondary | ICD-10-CM

## 2024-05-04 DIAGNOSIS — R140 Abdominal distension (gaseous): Secondary | ICD-10-CM

## 2024-05-04 DIAGNOSIS — R1010 Upper abdominal pain, unspecified: Secondary | ICD-10-CM

## 2024-05-04 DIAGNOSIS — R7989 Other specified abnormal findings of blood chemistry: Secondary | ICD-10-CM

## 2024-05-04 DIAGNOSIS — Z720 Tobacco use: Secondary | ICD-10-CM

## 2024-05-04 DIAGNOSIS — I739 Peripheral vascular disease, unspecified: Secondary | ICD-10-CM

## 2024-05-04 DIAGNOSIS — R1011 Right upper quadrant pain: Secondary | ICD-10-CM

## 2024-05-04 NOTE — Patient Instructions (Addendum)
 Please take your proton pump inhibitor medication, omeprazole  40 mg daily, Please take this medication 30 minutes to 1 hour before meals- this makes it more effective.   Can change it to the 20 mg of omeprazole  ( can get over the counter) and take once a day if symptoms improve but as long as you are drinking alcohol I would continue this medication  Avoid spicy and acidic foods Avoid fatty foods Limit your intake of coffee, tea, alcohol, and carbonated drinks Work to maintain a healthy weight Keep the head of the bed elevated at least 3 inches with blocks or a wedge pillow if you are having any nighttime symptoms Stay upright for 2 hours after eating Avoid meals and snacks three to four hours before bedtime  If symptoms return or worsen, please call and we would schedule for an upper endoscopy.   Go to the ER if you have coffee ground vomiting, unable to keep down food or drink, black tarry stools, dizziness, severe pain, shortness of breath or chest pain.    Thank you for trusting me with your gastrointestinal care!   Alan Coombs, PA-C   _______________________________________________________  If your blood pressure at your visit was 140/90 or greater, please contact your primary care physician to follow up on this.  _______________________________________________________  If you are age 69 or older, your body mass index should be between 23-30. Your Body mass index is 28.7 kg/m. If this is out of the aforementioned range listed, please consider follow up with your Primary Care Provider.  If you are age 72 or younger, your body mass index should be between 19-25. Your Body mass index is 28.7 kg/m. If this is out of the aformentioned range listed, please consider follow up with your Primary Care Provider.   ________________________________________________________  The [facility] providers would like to encourage you to use MYCHART to communicate with providers for non-urgent  requests or questions.  Due to long hold times on the telephone, sending your provider a message by [facility] may be a faster and more efficient way to get a response.  Please allow 48 business hours for a response.  Please remember that this is for non-urgent requests.  _______________________________________________________  [facility] is using a team-based approach to care.  Your team is made up of your doctor and two to three APPS. Our APPS (Nurse Practitioners and Physician Assistants) work with your physician to ensure care continuity for you. They are fully qualified to address your health concerns and develop a treatment plan. They communicate directly with your gastroenterologist to care for you. Seeing the Advanced Practice Practitioners on your physician's team can help you by facilitating care more promptly, often allowing for earlier appointments, access to diagnostic testing, procedures, and other specialty referrals.

## 2024-05-11 ENCOUNTER — Ambulatory Visit (INDEPENDENT_AMBULATORY_CARE_PROVIDER_SITE_OTHER): Admitting: Physician Assistant

## 2024-05-11 ENCOUNTER — Ambulatory Visit (HOSPITAL_COMMUNITY)
Admission: RE | Admit: 2024-05-11 | Discharge: 2024-05-11 | Disposition: A | Discharge status: Home / Self Care | Source: Ambulatory Visit | Attending: Vascular Surgery | Admitting: Vascular Surgery

## 2024-05-11 VITALS — BP 116/81 | HR 74 | Temp 97.7°F | Wt 199.8 lb

## 2024-05-11 DIAGNOSIS — I739 Peripheral vascular disease, unspecified: Secondary | ICD-10-CM | POA: Insufficient documentation

## 2024-05-11 LAB — VAS US ABI WITH/WO TBI
Left ABI: 1.08
Right ABI: 0.71

## 2024-05-11 NOTE — Progress Notes (Signed)
 Office Note   History of Present Illness   Karl Garcia is a 63 y.o. (1961/09/08) male who presents for surveillance of PAD.  He has a history of aortobifemoral bypass with bilateral common femoral endarterectomies and IMA ligation on 03/17/2023 by Dr. Sheree and Dr. Lanis.  Postoperatively he did have some issues with lower extremity swelling and numbness and tingling in the right foot.  He returns today for follow-up.  He has no complaints at today's visit.  He denies any claudication, rest pain, or tissue loss.  He says he can walk however much he wants without any pain.  He still feels like his right foot is numb since surgery.  He also denies any symptoms of chronic mesenteric ischemia such as food fear, unintentional weight loss, or postprandial abdominal pain.  He has had some discomfort and bloating since his surgery, however this has significantly improved since starting omeprazole .  He is followed by GI as well.  He says he stopped his aspirin  several months ago and does not remember why.  He is trying to cut back on smoking. Current Outpatient Medications  Medication Sig Dispense Refill   aspirin  EC 81 MG tablet Take 1 tablet (81 mg total) by mouth daily. Swallow whole. (Patient not taking: Reported on 05/04/2024) 30 tablet 12   atorvastatin  (LIPITOR ) 20 MG tablet TAKE 1 TABLET BY MOUTH DAILY 30 tablet 0   omeprazole  (PRILOSEC) 40 MG capsule Take 1 capsule (40 mg total) by mouth daily before breakfast. 90 capsule 3   tamsulosin  (FLOMAX ) 0.4 MG CAPS capsule Take 2 capsules (0.8 mg total) by mouth at bedtime. 90 capsule 3   telmisartan  (MICARDIS ) 40 MG tablet Take 1 tablet (40 mg total) by mouth daily. 90 tablet 1   No current facility-administered medications for this visit.    REVIEW OF SYSTEMS (negative unless checked):   Cardiac:  []  Chest pain or chest pressure? []  Shortness of breath upon activity? []  Shortness of breath when lying flat? []  Irregular heart  rhythm?  Vascular:  []  Pain in calf, thigh, or hip brought on by walking? []  Pain in feet at night that wakes you up from your sleep? []  Blood clot in your veins? []  Leg swelling?  Pulmonary:  []  Oxygen at home? []  Productive cough? []  Wheezing?  Neurologic:  []  Sudden weakness in arms or legs? []  Sudden numbness in arms or legs? []  Sudden onset of difficult speaking or slurred speech? []  Temporary loss of vision in one eye? []  Problems with dizziness?  Gastrointestinal:  []  Blood in stool? []  Vomited blood?  Genitourinary:  []  Burning when urinating? []  Blood in urine?  Psychiatric:  []  Major depression  Hematologic:  []  Bleeding problems? []  Problems with blood clotting?  Dermatologic:  []  Rashes or ulcers?  Constitutional:  []  Fever or chills?  Ear/Nose/Throat:  []  Change in hearing? []  Nose bleeds? []  Sore throat?  Musculoskeletal:  []  Back pain? []  Joint pain? []  Muscle pain?   Physical Examination   Vitals:   05/11/24 0832  BP: 116/81  Pulse: 74  Temp: 97.7 F (36.5 C)  TempSrc: Temporal  Weight: 199 lb 12.8 oz (90.6 kg)   Body mass index is 28.67 kg/m.  General:  WDWN in NAD; vital signs documented above Gait: Not observed HENT: WNL, normocephalic Pulmonary: normal non-labored breathing , without rales, rhonchi,  wheezing Cardiac: regular Abdomen: soft, NT, no masses Skin: without rashes Vascular Exam/Pulses: biphasic right PT doppler signal, 2+ left PT  pulse Extremities: without ischemic changes, without gangrene , without cellulitis; without open wounds;  Musculoskeletal: no muscle wasting or atrophy  Neurologic: A&O X 3;  No focal weakness or paresthesias are detected Psychiatric:  The pt has Normal affect.  Non-Invasive Vascular imaging   ABI (05/11/2024) R:  ABI: 0.71 (0.69),  PT: bi DP: mono TBI:  0.71 L:  ABI: 1.08 (1.11),  PT: tri DP: bi TBI: 0.78   Medical Decision Making   Karl Garcia is a 63 y.o. male  who presents for surveillance of PAD  Based on the patient's vascular studies, his ABIs are essentially unchanged bilaterally. His right ABI is 0.71 and left ABI is 1.08 He has recovered well from aortobifemoral bypass graft last fall.  He does have reported continued numbness in his right foot since surgery, which could be due to some nerve irritation He is tolerating a normal diet and his bloating has gotten better on omeprazole .  He is also followed by GI.  He has no symptoms of chronic mesenteric ischemia His lower extremities are well-perfused with a brisk right PT Doppler signal and palpable left PT pulse.  He denies any claudication, rest pain, or tissue loss. I have encouraged the patient to restart his daily baby aspirin .  He should continue this lifelong as well as his statin.  He can follow-up with our office in 1 year with repeat ABIs   Ahmed Holster PA-C [facility] Office: [phone]  Clinic MD: Sheree

## 2024-05-12 ENCOUNTER — Inpatient Hospital Stay

## 2024-05-12 VITALS — BP 127/87 | HR 71 | Temp 97.9°F | Resp 20 | Wt 199.6 lb

## 2024-05-12 DIAGNOSIS — Z885 Allergy status to narcotic agent status: Secondary | ICD-10-CM | POA: Insufficient documentation

## 2024-05-12 DIAGNOSIS — F17210 Nicotine dependence, cigarettes, uncomplicated: Secondary | ICD-10-CM | POA: Insufficient documentation

## 2024-05-12 DIAGNOSIS — Z5900 Homelessness unspecified: Secondary | ICD-10-CM | POA: Insufficient documentation

## 2024-05-12 DIAGNOSIS — Z5941 Food insecurity: Secondary | ICD-10-CM | POA: Insufficient documentation

## 2024-05-12 DIAGNOSIS — I2510 Atherosclerotic heart disease of native coronary artery without angina pectoris: Secondary | ICD-10-CM | POA: Diagnosis not present

## 2024-05-12 DIAGNOSIS — I10 Essential (primary) hypertension: Secondary | ICD-10-CM | POA: Diagnosis not present

## 2024-05-12 DIAGNOSIS — Z7982 Long term (current) use of aspirin: Secondary | ICD-10-CM | POA: Insufficient documentation

## 2024-05-12 DIAGNOSIS — C778 Secondary and unspecified malignant neoplasm of lymph nodes of multiple regions: Secondary | ICD-10-CM | POA: Insufficient documentation

## 2024-05-12 DIAGNOSIS — I252 Old myocardial infarction: Secondary | ICD-10-CM | POA: Diagnosis not present

## 2024-05-12 DIAGNOSIS — C61 Malignant neoplasm of prostate: Secondary | ICD-10-CM | POA: Diagnosis not present

## 2024-05-12 DIAGNOSIS — M8580 Other specified disorders of bone density and structure, unspecified site: Secondary | ICD-10-CM | POA: Insufficient documentation

## 2024-05-12 DIAGNOSIS — Z8249 Family history of ischemic heart disease and other diseases of the circulatory system: Secondary | ICD-10-CM | POA: Insufficient documentation

## 2024-05-12 DIAGNOSIS — Z9089 Acquired absence of other organs: Secondary | ICD-10-CM | POA: Insufficient documentation

## 2024-05-12 DIAGNOSIS — Z79899 Other long term (current) drug therapy: Secondary | ICD-10-CM | POA: Diagnosis not present

## 2024-05-12 DIAGNOSIS — Z9189 Other specified personal risk factors, not elsewhere classified: Secondary | ICD-10-CM | POA: Insufficient documentation

## 2024-05-12 NOTE — Assessment & Plan Note (Addendum)
 ADT started already Patient has been recommended to start apalutamide through urology Patient will follow-up as needed.

## 2024-05-12 NOTE — Progress Notes (Addendum)
 [facility] CONSULT NOTE  Patient Care Team: Caro Harlene POUR, NP as PCP - General (Geriatric Medicine) Okey Vina GAILS, MD as PCP - Cardiology (Cardiology) Ezzard Rolin BIRCH, LCSW as Social Worker (Licensed Clinical Social Worker) [facility] Watt Rush, MD as Attending Physician (Urology)  ASSESSMENT & PLAN:  Karl Garcia is a 63 y.o.male with history of CAD, MI, diabetes, aortobifemoral bypass, CABG in 2021 alcohol abuse, tobacco abuse [facility].  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 Somatic testing: not yet Treatment: ADT and a PI to be started  The patient was counseled on the natural history of prostate cancer and the standard treatment options that are available for prostate cancer.  Patient was referred here to assess candidacy for triplet therapy with docetaxel along with ADT and ARPI.  He has started Orgovyx and saw urology this morning and recommended to start apalutamide.  He is tolerating Orgovyx well without side effects.    We discussed metastatic prostate cancer is not considered curable but treatable.  Discussed rationale for triplet therapy with adding docetaxel in addition to ARPI and ADT.  As soon as I mention potential side effect of docetaxel, patient voiced No to chemotherapy.  He says he would just want to continue oral medication for now.  He has known history of alcohol drinking.  We discussed that it is important to decrease and eventually stop alcohol drinking.  This will hopefully preserve his liver function. If he was to progress on current treatment, chemotherapy can be considered in patient with healthy liver function, and also prevent worsening fatty liver to become cirrhosis in the future.  Also discussed importance of smoking  cessation.  Discussed exercise, calcium  vitamin D and healthy diet as well.  Assessment & Plan Prostatic adenocarcinoma [facility]) ADT started already Patient has been recommended to start apalutamide through urology Patient will follow-up as needed. At risk for side effect of medication Supportive baseline bone mineral density study and then every 2 years calcium  (1000-1200 mg daily from food and supplements) and vitamin D3 (1000 IU daily) Zometa (5 mg IV annually) for osteopenia (T-score between -1.0 and -2.5) on ADT after dental clearance. If CRPC, 4 mg every 3 months if having bone metastases. Healthy lifestyle to prevent diabetes and CV disease Aggressive cardiovascular risk management Weight-bearing exercises (30 minutes per day) Limit alcohol consumption and avoid smoking  All questions were answered. The patient knows to call the clinic with any problems, questions or concerns. No barriers to learning was detected.  Pauletta JAYSON Chihuahua, MD 9/4/202510:29 AM  CHIEF COMPLAINTS/PURPOSE OF CONSULTATION:  Prostate cancer  HISTORY OF PRESENTING ILLNESS:  Karl Garcia 63 y.o. male is here because of prostate cancer. I have reviewed his chart and materials related to his cancer extensively and collaborated history with the patient. Summary of oncologic history is as follows:  History obtained from records from [facility] urology.  Report patient was found to have elevated PSA of 74 in April 2024.  This increased to 179 in June.  PSMA PET was ordered and showed uptake in prostate gland consistent with prostate adenocarcinoma, nodal metastases in the supraclavicular, mediastinum, and retroperitoneum.  No bone or visceral metastases.  Oncology History  Prostatic adenocarcinoma (HCC)  03/31/2024 PET scan   PSMA PET 1. Intense radiotracer activity within the prostate  gland consistent with primary prostate adenocarcinoma. 2. Evidence of nodal metastasis with radiotracer avid lymph nodes in the  LEFT supraclavicular space, high LEFT mediastinum, bilateral common iliac chains and periaortic retroperitoneum. 3. No evidence of visceral metastasis or skeletal metastasis.   05/04/2024 Pathology Results   Follow-up with urology for pathology result. Report of 6/6 cores positive for high-volume GG5 prostate cancer, and 4 cores of GG4, one quarter through [facility].  Repeat PSA was 199.   05/12/2024 Initial Diagnosis   Prostatic adenocarcinoma (HCC)   05/12/2024 Cancer Staging   Staging form: Prostate, AJCC 8th Edition - Clinical stage from 05/12/2024: Stage IVB (cTX, cN1, cM1a, PSA: 179, Grade Group: 5) - Signed by Tina Pauletta BROCKS, MD on 05/12/2024 Stage prefix: Initial diagnosis Prostate specific antigen (PSA) range: 20 or greater Histologic grading system: 5 grade system     MEDICAL HISTORY:  Past Medical History:  Diagnosis Date   Coronary artery disease    S/p NSTEMI 02/2020 >> s/p CABG // Echocardiogram 6/21: EF 55-60, Gr 2 DD, LAE   Diabetes mellitus Type 2    Essential hypertension    ETOH abuse    Hx of NSTEMI in 02/2020    Hyperlipidemia    L subclavian stenosis    NSTEMI (non-ST elevated myocardial infarction) (HCC) 02/16/2020   Peripheral Arterial Disease    Tobacco abuse     SURGICAL HISTORY: Past Surgical History:  Procedure Laterality Date   ABDOMINAL AORTOGRAM W/LOWER EXTREMITY Bilateral 07/16/2020   Procedure: ABDOMINAL AORTOGRAM W/LOWER EXTREMITY;  Surgeon: Court Dorn PARAS, MD;  Location: [facility];  Service: Cardiovascular;  Laterality: Bilateral;   AORTA - BILATERAL FEMORAL ARTERY BYPASS GRAFT N/A 03/17/2023   Procedure: AORTOBIFEMORAL BYPASS GRAFT USING X HEMASHIELD GOLD VASCULAR GRAFT, LIGATION OF INTERNAL MESENTERIC ARTERY;  Surgeon: Sheree Penne Bruckner, MD;  Location: [facility] OR;  Service: Vascular;  Laterality: N/A;   CORONARY ARTERY BYPASS GRAFT N/A 02/17/2020   Procedure: CORONARY ARTERY BYPASS GRAFTING (CABG) x Three, using left internal  mammry artery and right leg greater saphenous vein harvested endoscopically;  Surgeon: Fleeta Hanford Coy, MD;  Location: [facility] OR;  Service: Open Heart Surgery;  Laterality: N/A;   LEFT HEART CATH AND CORONARY ANGIOGRAPHY N/A 02/16/2020   Procedure: LEFT HEART CATH AND CORONARY ANGIOGRAPHY;  Surgeon: Court Dorn PARAS, MD;  Location: [facility] CV [facility];  Service: Cardiovascular;  Laterality: N/A;   TEE WITHOUT CARDIOVERSION N/A 02/17/2020   Procedure: TRANSESOPHAGEAL ECHOCARDIOGRAM (TEE);  Surgeon: Fleeta Hanford, Coy, MD;  Location: [facility] OR;  Service: Open Heart Surgery;  Laterality: N/A;   TONSILECTOMY, ADENOIDECTOMY, BILATERAL MYRINGOTOMY AND TUBES     unsure of all of that but + tonsilectomy    SOCIAL HISTORY: Social History   Socioeconomic History   Marital status: Single    Spouse name: Not on file   Number of children: Not on file   Years of education: Not on file   Highest education level: Not on file  Occupational History   Occupation: Unemployed for about 6 months now    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   Occupation: retired  Tobacco Use   Smoking status: Every Day    Current packs/day: 0.50    Average packs/day: 0.5 packs/day for 50.0 years (25.0 ttl pk-yrs)    Types: Cigarettes   Smokeless tobacco: Never   Tobacco comments:    Discussed with patient he is interested but not committed at this time  Vaping Use  Vaping status: Never Used  Substance and Sexual Activity   Alcohol use: Yes    Comment: a couple of drinks a day   Drug use: Never   Sexual activity: Yes  Other Topics Concern   Not on file  Social History Narrative   Tobacco use, amount per day now: 1/2 pack    Past tobacco use, amount per day: 1 pack    How many years did you use tobacco: 50 years    Alcohol use (drinks per week): 3-4 drinks    Diet: healthy    Do you drink/eat things with caffeine: yes, coffee   Marital status:  single                                What year were you married? N/A   Do  you live in a house, apartment, assisted living, condo, trailer, etc.? Currently homeless   Is it one or more stories? N/A   How many persons live in your home? N/A   Do you have pets in your home?( please list) N/A   Highest Level of education completed? Some college, Public relations account executive.    Current or past profession: Land   Do you exercise?   Yes, No                               Type and how often? Walking everyday   Do you have a living will? No   Do you have a DNR form?   Yes                                If not, do you want to discuss one?   Do you have signed POA/HPOA forms? No                        If so, please bring to you appointment      Do you have any difficulty bathing or dressing yourself? No   Do you have any difficulty preparing food or eating? No   Do you have any difficulty managing your medications? No   Do you have any difficulty managing your finances? Yes   Do you have any difficulty affording your medications?  No   Social Drivers of Corporate investment banker Strain: Low Risk  (03/19/2023)   Overall Financial Resource Strain (CARDIA)    Difficulty of Paying Living Expenses: Not hard at all  Food Insecurity: Food Insecurity Present (05/12/2024)   Hunger Vital Sign    Worried About Running Out of Food in the Last Year: Sometimes true    Ran Out of Food in the Last Year: Sometimes true  Transportation Needs: No Transportation Needs (05/12/2024)   PRAPARE - Administrator, Civil Service (Medical): No    Lack of Transportation (Non-Medical): No  Physical Activity: Sufficiently Active (03/19/2023)   Exercise Vital Sign    Days of Exercise per Week: 7 days    Minutes of Exercise per Session: 60 min  Stress: No Stress Concern Present (03/19/2023)   [employer] of Occupational Health - Occupational Stress Questionnaire    Feeling of Stress : Not at all  Social Connections: Moderately Isolated (03/19/2023)   Social Connection and Isolation Panel     Frequency of Communication  with Friends and Family: More than three times a week    Frequency of Social Gatherings with Friends and Family: More than three times a week    Attends Religious Services: Never    Database administrator or Organizations: No    Attends Banker Meetings: Never    Marital Status: Living with partner  Intimate Partner Violence: Not At Risk (05/12/2024)   Humiliation, Afraid, Rape, and Kick questionnaire    Fear of Current or Ex-Partner: No    Emotionally Abused: No    Physically Abused: No    Sexually Abused: No    FAMILY HISTORY: Family History  Problem Relation Age of Onset   Heart attack Mother    Heart disease Father    Heart attack Father    Heart failure Father    Aneurysm Father    Healthy Brother    Heart disease Paternal Uncle     ALLERGIES:  is allergic to morphine  and codeine.  MEDICATIONS:  Current Outpatient Medications  Medication Sig Dispense Refill   aspirin  EC 81 MG tablet Take 1 tablet (81 mg total) by mouth daily. Swallow whole. 30 tablet 12   atorvastatin  (LIPITOR ) 20 MG tablet TAKE 1 TABLET BY MOUTH DAILY 30 tablet 0   omeprazole  (PRILOSEC) 40 MG capsule Take 1 capsule (40 mg total) by mouth daily before breakfast. 90 capsule 3   relugolix (ORGOVYX) 120 MG tablet Take 120 mg by mouth daily.     tamsulosin  (FLOMAX ) 0.4 MG CAPS capsule Take 2 capsules (0.8 mg total) by mouth at bedtime. 90 capsule 3   telmisartan  (MICARDIS ) 40 MG tablet Take 1 tablet (40 mg total) by mouth daily. 90 tablet 1   No current facility-administered medications for this visit.    REVIEW OF SYSTEMS:   All relevant systems were reviewed with the patient and are negative.  PHYSICAL EXAMINATION: ECOG PERFORMANCE STATUS: 1  Vitals:   05/12/24 1001  BP: 127/87  Pulse: 71  Resp: 20  Temp: 97.9 F (36.6 C)  SpO2: 97%   Filed Weights   05/12/24 1001  Weight: 199 lb 9.6 oz (90.5 kg)    GENERAL: alert, no distress and comfortable SKIN:  skin color is normal, no jaundice, rashes EYES: sclera clear OROPHARYNX: no exudate, no erythema NECK: supple LYMPH:  no palpable lymphadenopathy in the cervical, axillary regions LUNGS: Effort normal, no respiratory distress.  Clear to auscultation bilaterally HEART: regular rate & rhythm and no lower extremity edema ABDOMEN: soft, non-tender, distended Musculoskeletal: no edema   LABORATORY DATA:  I have reviewed the results of PSA.  RADIOGRAPHIC STUDIES: I have personally reviewed the radiological images as listed and agreed with the findings in the report.

## 2024-05-12 NOTE — Assessment & Plan Note (Signed)
 Supportive baseline bone mineral density study and then every 2 years calcium  (1000-1200 mg daily from food and supplements) and vitamin D3 (1000 IU daily) Zometa (5 mg IV annually) for osteopenia (T-score between -1.0 and -2.5) on ADT after dental clearance. If CRPC, 4 mg every 3 months if having bone metastases. Healthy lifestyle to prevent diabetes and CV disease Aggressive cardiovascular risk management Weight-bearing exercises (30 minutes per day) Limit alcohol consumption and avoid smoking

## 2024-05-16 ENCOUNTER — Encounter: Payer: Self-pay | Admitting: Urology

## 2024-05-23 ENCOUNTER — Other Ambulatory Visit: Payer: Self-pay | Admitting: Urology

## 2024-05-23 DIAGNOSIS — N401 Enlarged prostate with lower urinary tract symptoms: Secondary | ICD-10-CM

## 2024-05-24 DIAGNOSIS — C61 Malignant neoplasm of prostate: Secondary | ICD-10-CM | POA: Diagnosis not present

## 2024-06-02 ENCOUNTER — Other Ambulatory Visit: Payer: Self-pay | Admitting: Nurse Practitioner

## 2024-06-02 NOTE — Telephone Encounter (Signed)
 Medication was previously orders by another Dr. Patient is requesting 100 day supply. Medication pend and sent to PCP Caro Jessica K, NP for approval.

## 2024-06-02 NOTE — Telephone Encounter (Unsigned)
 Copied from CRM [id number]. Topic: Clinical - Medication Refill >> Jun 02, 2024 11:48 AM Diannia H wrote: Medication: atorvastatin  (LIPITOR ) 20 MG tablet  Has the patient contacted their pharmacy? Yes (Agent: If no, request that the patient contact the pharmacy for the refill. If patient does not wish to contact the pharmacy document the reason why and proceed with request.) (Agent: If yes, when and what did the pharmacy advise?)  This is the patient's preferred pharmacy:  [facility] [id number] - [place], [place] - [address] [place] [address] Phone: [phone] Fax: [phone]  Is this the correct pharmacy for this prescription? Yes If no, delete pharmacy and type the correct one.   Has the prescription been filled recently? No  Is the patient out of the medication? Yes  Has the patient been seen for an appointment in the last year OR does the patient have an upcoming appointment? Yes  Can we respond through MyChart? Yes  Agent: Please be advised that Rx refills may take up to 3 business days. We ask that you follow-up with your pharmacy.

## 2024-06-03 ENCOUNTER — Telehealth: Payer: Self-pay

## 2024-06-03 DIAGNOSIS — N401 Enlarged prostate with lower urinary tract symptoms: Secondary | ICD-10-CM

## 2024-06-03 MED ORDER — TAMSULOSIN HCL 0.4 MG PO CAPS: 0.8000 mg | ORAL_CAPSULE | Freq: Every day | ORAL | 0 refills | Status: AC

## 2024-06-03 NOTE — Telephone Encounter (Signed)
 Pt is requesting a refill of tamsulosin . He has not been seen since 04/2023. He was a IT trainer pt.

## 2024-06-03 NOTE — Telephone Encounter (Signed)
 90d and 0 refills sent in. Note to pharmacy that pt needs appt for further refills.

## 2024-06-06 DIAGNOSIS — C61 Malignant neoplasm of prostate: Secondary | ICD-10-CM | POA: Diagnosis not present

## 2024-06-23 DIAGNOSIS — I2510 Atherosclerotic heart disease of native coronary artery without angina pectoris: Secondary | ICD-10-CM | POA: Diagnosis not present

## 2024-06-23 DIAGNOSIS — R809 Proteinuria, unspecified: Secondary | ICD-10-CM | POA: Diagnosis not present

## 2024-06-23 DIAGNOSIS — N400 Enlarged prostate without lower urinary tract symptoms: Secondary | ICD-10-CM | POA: Diagnosis not present

## 2024-06-23 DIAGNOSIS — I739 Peripheral vascular disease, unspecified: Secondary | ICD-10-CM | POA: Diagnosis not present

## 2024-06-23 DIAGNOSIS — E1129 Type 2 diabetes mellitus with other diabetic kidney complication: Secondary | ICD-10-CM | POA: Diagnosis not present

## 2024-07-11 ENCOUNTER — Ambulatory Visit: Payer: Self-pay | Admitting: Nurse Practitioner

## 2024-07-11 ENCOUNTER — Encounter: Payer: Self-pay | Admitting: Nurse Practitioner

## 2024-07-11 VITALS — BP 126/84 | HR 62 | Temp 98.1°F | Ht 70.0 in | Wt 204.2 lb

## 2024-07-11 DIAGNOSIS — I739 Peripheral vascular disease, unspecified: Secondary | ICD-10-CM

## 2024-07-11 DIAGNOSIS — F17200 Nicotine dependence, unspecified, uncomplicated: Secondary | ICD-10-CM

## 2024-07-11 DIAGNOSIS — I10 Essential (primary) hypertension: Secondary | ICD-10-CM

## 2024-07-11 DIAGNOSIS — E1169 Type 2 diabetes mellitus with other specified complication: Secondary | ICD-10-CM

## 2024-07-11 DIAGNOSIS — E782 Mixed hyperlipidemia: Secondary | ICD-10-CM

## 2024-07-11 DIAGNOSIS — I25810 Atherosclerosis of coronary artery bypass graft(s) without angina pectoris: Secondary | ICD-10-CM | POA: Diagnosis not present

## 2024-07-11 DIAGNOSIS — C61 Malignant neoplasm of prostate: Secondary | ICD-10-CM

## 2024-07-11 MED ORDER — ATORVASTATIN CALCIUM 20 MG PO TABS: 20.0000 mg | ORAL_TABLET | Freq: Every day | ORAL | 0 refills | Status: AC

## 2024-07-11 NOTE — Progress Notes (Unsigned)
 Careteam: Patient Care Team: Caro Harlene POUR, NP as PCP - General (Geriatric Medicine) Okey Vina GAILS, MD as PCP - Cardiology (Cardiology) Ezzard Rolin BIRCH, LCSW as Social Worker (Licensed Clinical Social Worker) [facility] Watt Rush, MD as Attending Physician (Urology)  PLACE OF SERVICE:  [facility]  Advanced Directive information    Allergies  Allergen Reactions   Morphine  And Codeine Rash    Chief Complaint  Patient presents with   Medical Management of Chronic Issues    4 month routine visit  No concerns  Declined all vaccinations     HPI:  Discussed the use of AI scribe software for clinical note transcription with the patient, who gave verbal consent to proceed.  History of Present Illness Karl Garcia is a 63 year old male with prostate cancer who presents for a four-month follow-up.  He is under the care of a urologist and is taking Orgovyx and Erleada daily for prostate cancer. He takes calcium  with D3 daily due to potential side effects of his medications, including bone weakening. He drinks a lot of milk but has not had a bone density test scheduled yet.  He is also under the care of a vein and vascular specialist for peripheral artery disease. He denies any pain with walking and is on 81 mg of aspirin  daily. He had an arteriofemoral bypass graft last year and is on Lipitor  for cholesterol management.  He has a history of elevated liver enzymes, which have trended down since the beginning of the year.  He is taking omeprazole  for bloating and abdominal discomfort, which has been effective. He has not completed a Cologuard test for colon cancer screening, and the test remains at his home. Encouraged to complete testing  He continues to smoke cigarettes and has not had a CT scan of the lungs recently, although a PET scan was done. A small lung nodule was noted on a CT scan in April 2024, but was not noted on the PET scan.  He is not  on medication for diabetes due to cost, and his A1c has been creeping up. He declines a foot exam today. No chest pain, shortness of breath, indigestion, or acid reflux. He experiences occasional diarrhea but reports his bowel movements are generally good.    Review of Systems:  Review of Systems  Constitutional:  Negative for chills, fever and weight loss.  HENT:  Negative for tinnitus.   Respiratory:  Negative for cough, sputum production and shortness of breath.   Cardiovascular:  Negative for chest pain, palpitations and leg swelling.  Gastrointestinal:  Negative for abdominal pain, constipation, diarrhea and heartburn.  Genitourinary:  Negative for dysuria, frequency and urgency.  Musculoskeletal:  Negative for back pain, falls, joint pain and myalgias.  Skin: Negative.   Neurological:  Negative for dizziness and headaches.  Psychiatric/Behavioral:  Negative for depression and memory loss. The patient does not have insomnia.     Past Medical History:  Diagnosis Date   Coronary artery disease    S/p NSTEMI 02/2020 >> s/p CABG // Echocardiogram 6/21: EF 55-60, Gr 2 DD, LAE   Diabetes mellitus Type 2    Essential hypertension    ETOH abuse    Hx of NSTEMI in 02/2020    Hyperlipidemia    L subclavian stenosis    NSTEMI (non-ST elevated myocardial infarction) (HCC) 02/16/2020   Peripheral Arterial Disease    Tobacco abuse    Past Surgical History:  Procedure Laterality  Date   ABDOMINAL AORTOGRAM W/LOWER EXTREMITY Bilateral 07/16/2020   Procedure: ABDOMINAL AORTOGRAM W/LOWER EXTREMITY;  Surgeon: Court Dorn PARAS, MD;  Location: [facility];  Service: Cardiovascular;  Laterality: Bilateral;   AORTA - BILATERAL FEMORAL ARTERY BYPASS GRAFT N/A 03/17/2023   Procedure: AORTOBIFEMORAL BYPASS GRAFT USING X HEMASHIELD GOLD VASCULAR GRAFT, LIGATION OF INTERNAL MESENTERIC ARTERY;  Surgeon: Sheree Penne Bruckner, MD;  Location: [facility] OR;  Service: Vascular;  Laterality: N/A;    CORONARY ARTERY BYPASS GRAFT N/A 02/17/2020   Procedure: CORONARY ARTERY BYPASS GRAFTING (CABG) x Three, using left internal mammry artery and right leg greater saphenous vein harvested endoscopically;  Surgeon: Fleeta Hanford Coy, MD;  Location: [facility] OR;  Service: Open Heart Surgery;  Laterality: N/A;   LEFT HEART CATH AND CORONARY ANGIOGRAPHY N/A 02/16/2020   Procedure: LEFT HEART CATH AND CORONARY ANGIOGRAPHY;  Surgeon: Court Dorn PARAS, MD;  Location: [facility];  Service: Cardiovascular;  Laterality: N/A;   TEE WITHOUT CARDIOVERSION N/A 02/17/2020   Procedure: TRANSESOPHAGEAL ECHOCARDIOGRAM (TEE);  Surgeon: Fleeta Hanford, Coy, MD;  Location: [facility] OR;  Service: Open Heart Surgery;  Laterality: N/A;   TONSILECTOMY, ADENOIDECTOMY, BILATERAL MYRINGOTOMY AND TUBES     unsure of all of that but + tonsilectomy   Social History:   reports that he has been smoking cigarettes. He has a 25 pack-year smoking history. He has never used smokeless tobacco. He reports current alcohol use. He reports that he does not use drugs.  Family History  Problem Relation Age of Onset   Heart attack Mother    Heart disease Father    Heart attack Father    Heart failure Father    Aneurysm Father    Healthy Brother    Heart disease Paternal Uncle     Medications: Patient's Medications  New Prescriptions   No medications on file  Previous Medications   ASPIRIN  EC 81 MG TABLET    Take 1 tablet (81 mg total) by mouth daily. Swallow whole.   ATORVASTATIN  (LIPITOR ) 20 MG TABLET    TAKE 1 TABLET BY MOUTH DAILY   ERLEADA 60 MG TABLET    Take 240 mg by mouth daily. Take 4 tablets daily.   OMEPRAZOLE  (PRILOSEC) 40 MG CAPSULE    Take 1 capsule (40 mg total) by mouth daily before breakfast.   RELUGOLIX (ORGOVYX) 120 MG TABLET    Take 120 mg by mouth daily.   TAMSULOSIN  (FLOMAX ) 0.4 MG CAPS CAPSULE    Take 2 capsules (0.8 mg total) by mouth at bedtime.   TELMISARTAN  (MICARDIS ) 40 MG TABLET    Take 1 tablet (40 mg total)  by mouth daily.  Modified Medications   No medications on file  Discontinued Medications   No medications on file    Physical Exam:  Vitals:   07/11/24 0818  BP: 126/84  Pulse: 62  Temp: 98.1 F (36.7 C)  SpO2: 98%  Weight: 204 lb 3.2 oz (92.6 kg)  Height: 5' 10 (1.778 m)   Body mass index is 29.3 kg/m. Wt Readings from Last 3 Encounters:  07/11/24 204 lb 3.2 oz (92.6 kg)  05/12/24 199 lb 9.6 oz (90.5 kg)  05/11/24 199 lb 12.8 oz (90.6 kg)    Physical Exam Constitutional:      General: He is not in acute distress.    Appearance: He is well-developed. He is not diaphoretic.  HENT:     Head: Normocephalic and atraumatic.     Right Ear:  External ear normal.     Left Ear: External ear normal.     Mouth/Throat:     Pharynx: No oropharyngeal exudate.  Eyes:     Conjunctiva/sclera: Conjunctivae normal.     Pupils: Pupils are equal, round, and reactive to light.  Cardiovascular:     Rate and Rhythm: Normal rate and regular rhythm.     Heart sounds: Normal heart sounds.  Pulmonary:     Effort: Pulmonary effort is normal.     Breath sounds: Normal breath sounds.  Abdominal:     General: Bowel sounds are normal.     Palpations: Abdomen is soft.  Musculoskeletal:        General: No tenderness.     Cervical back: Normal range of motion and neck supple.     Right lower leg: No edema.     Left lower leg: No edema.  Skin:    General: Skin is warm and dry.  Neurological:     Mental Status: He is alert and oriented to person, place, and time.    Labs reviewed: Basic Metabolic Panel: Recent Labs    12/04/23 1148 02/26/24 1200 02/27/24 2357 04/08/24 0000  NA 138 136 140 139  K 3.8 3.8 3.4* 4.3  CL 100 98 105 101  CO2 27 30 22  25*  GLUCOSE 105 149* 126*  --   BUN 12 12 11 17   CREATININE 1.00 0.85 0.91 0.9  CALCIUM  8.7 9.2 7.8* 9.3   Liver Function Tests: Recent Labs    12/04/23 1148 12/18/23 0809 02/26/24 1200 04/08/24 0000  AST 55* 57* 31  --   ALT  50* 52* 31  --   ALKPHOS  --   --  81  --   BILITOT 0.5 0.8 0.9  --   PROT 6.4 6.7 7.1  --   ALBUMIN   --   --  4.1 4.2   No results for input(s): LIPASE, AMYLASE in the last 8760 hours. No results for input(s): AMMONIA in the last 8760 hours. CBC: Recent Labs    12/04/23 1148 02/26/24 1200 02/27/24 2357  WBC 4.8 4.8 5.0  NEUTROABS 2,146 3.3 2.4  HGB 14.3 15.0 13.0  HCT 41.5 43.9 37.8*  MCV 101.0* 102.7* 104.4*  PLT 177 164.0 160   Lipid Panel: Recent Labs    03/07/24 1456  CHOL 164  HDL 60  LDLCALC 88  TRIG 71  CHOLHDL 2.7   TSH: No results for input(s): TSH in the last 8760 hours. A1C: Lab Results  Component Value Date   HGBA1C 6.1 (H) 03/07/2024     Assessment/Plan  Assessment & Plan Malignant neoplasm of prostate Prostate cancer with lymph node involvement, on Orgovyx and Erleada. - Continue Orgovyx and Erleada. - Recommend calcium  supplement twice daily. - Recommend vitamin D supplement. - Follow up with urologist and oncologist.  Peripheral artery disease of lower extremities, status post arteriofemoral bypass Peripheral artery disease post arteriofemoral bypass. No claudication. On aspirin  and Lipitor . - Continue aspirin  81 mg daily. - Continue Lipitor . - Monitor ABI through vascular  CAD Stable, no chest pains at this time Continue on statin and asa   Type 2 diabetes mellitus, not on medication Type 2 diabetes with increasing A1c, managed without medication. Concerns about complications. - Encourage dietary management. - Recommend yearly eye exam. - Encourage regular foot self-exams and foot exams in office but refused foot exam during visit today  Mixed hyperlipidemia Mixed hyperlipidemia managed with Lipitor . - Continue Lipitor .  Essential hypertension  Blood pressure well-controlled at 126/84 mmHg.  Nicotine  dependence (cigarette smoking) Continues smoking. Discussed increased cancer and health risks. Not interested in  quitting. - Continue to encourage smoking cessation.  General Health Maintenance Declined flu shot despite recommendation due to increased risk of complications. - Encourage flu vaccination.   Return in about 4 months (around 11/08/2024) for routine follow up, labs at time of visit.  Berlie Persky K. Caro BODILY [facility] [phone]

## 2024-07-12 ENCOUNTER — Ambulatory Visit: Payer: Self-pay | Admitting: Nurse Practitioner

## 2024-07-12 LAB — CBC WITH DIFFERENTIAL/PLATELET
Absolute Lymphocytes: 1402 {cells}/uL (ref 850–3900)
Absolute Monocytes: 832 {cells}/uL (ref 200–950)
Basophils Absolute: 51 {cells}/uL (ref 0–200)
Basophils Relative: 0.9 %
Eosinophils Absolute: 182 {cells}/uL (ref 15–500)
Eosinophils Relative: 3.2 %
HCT: 44 % (ref 38.5–50.0)
Hemoglobin: 14.9 g/dL (ref 13.2–17.1)
MCH: 34.4 pg — ABNORMAL HIGH (ref 27.0–33.0)
MCHC: 33.9 g/dL (ref 32.0–36.0)
MCV: 101.6 fL — ABNORMAL HIGH (ref 80.0–100.0)
MPV: 10.8 fL (ref 7.5–12.5)
Monocytes Relative: 14.6 %
Neutro Abs: 3232 {cells}/uL (ref 1500–7800)
Neutrophils Relative %: 56.7 %
Platelets: 198 Thousand/uL (ref 140–400)
RBC: 4.33 Million/uL (ref 4.20–5.80)
RDW: 13.1 % (ref 11.0–15.0)
Total Lymphocyte: 24.6 %
WBC: 5.7 Thousand/uL (ref 3.8–10.8)

## 2024-07-12 LAB — COMPREHENSIVE METABOLIC PANEL WITH GFR
AG Ratio: 1.5 (calc) (ref 1.0–2.5)
ALT: 18 U/L (ref 9–46)
AST: 27 U/L (ref 10–35)
Albumin: 4.1 g/dL (ref 3.6–5.1)
Alkaline phosphatase (APISO): 66 U/L (ref 35–144)
BUN: 13 mg/dL (ref 7–25)
CO2: 30 mmol/L (ref 20–32)
Calcium: 9.9 mg/dL (ref 8.6–10.3)
Chloride: 98 mmol/L (ref 98–110)
Creat: 0.93 mg/dL (ref 0.70–1.35)
Globulin: 2.7 g/dL (ref 1.9–3.7)
Glucose, Bld: 117 mg/dL — ABNORMAL HIGH (ref 65–99)
Potassium: 5 mmol/L (ref 3.5–5.3)
Sodium: 137 mmol/L (ref 135–146)
Total Bilirubin: 0.5 mg/dL (ref 0.2–1.2)
Total Protein: 6.8 g/dL (ref 6.1–8.1)
eGFR: 91 mL/min/1.73m2 (ref >=60)

## 2024-07-12 LAB — HEMOGLOBIN A1C
Hgb A1c MFr Bld: 6.3 % — ABNORMAL HIGH (ref <5.7)
Mean Plasma Glucose: 134 mg/dL
eAG (mmol/L): 7.4 mmol/L

## 2024-07-26 ENCOUNTER — Other Ambulatory Visit: Payer: Self-pay | Admitting: Urology

## 2024-07-26 DIAGNOSIS — N401 Enlarged prostate with lower urinary tract symptoms: Secondary | ICD-10-CM

## 2024-08-10 ENCOUNTER — Ambulatory Visit

## 2024-09-12 ENCOUNTER — Ambulatory Visit

## 2024-11-14 ENCOUNTER — Ambulatory Visit: Admitting: Nurse Practitioner

## 2025-04-18 ENCOUNTER — Ambulatory Visit: Payer: Self-pay | Admitting: Nurse Practitioner
